# Patient Record
Sex: Female | Born: 1935 | Race: White | Hispanic: No | Smoking: Never smoker
Health system: Southern US, Community
[De-identification: ages and names within clinical notes are randomized; demographics above are authoritative.]

## PROBLEM LIST (undated history)

## (undated) DIAGNOSIS — R5383 Other fatigue: Secondary | ICD-10-CM

## (undated) DIAGNOSIS — N1330 Unspecified hydronephrosis: Secondary | ICD-10-CM

## (undated) DIAGNOSIS — C50919 Malignant neoplasm of unspecified site of unspecified female breast: Secondary | ICD-10-CM

## (undated) DIAGNOSIS — R7303 Prediabetes: Secondary | ICD-10-CM

## (undated) DIAGNOSIS — R600 Localized edema: Secondary | ICD-10-CM

## (undated) DIAGNOSIS — R7401 Elevation of levels of liver transaminase levels: Secondary | ICD-10-CM

## (undated) DIAGNOSIS — I700 Atherosclerosis of aorta: Secondary | ICD-10-CM

## (undated) DIAGNOSIS — H9209 Otalgia, unspecified ear: Secondary | ICD-10-CM

## (undated) DIAGNOSIS — K760 Fatty (change of) liver, not elsewhere classified: Secondary | ICD-10-CM

## (undated) DIAGNOSIS — M545 Low back pain, unspecified: Secondary | ICD-10-CM

## (undated) DIAGNOSIS — R740 Nonspecific elevation of levels of transaminase and lactic acid dehydrogenase [LDH]: Secondary | ICD-10-CM

## (undated) DIAGNOSIS — H9319 Tinnitus, unspecified ear: Secondary | ICD-10-CM

## (undated) DIAGNOSIS — R7402 Elevation of levels of lactic acid dehydrogenase (LDH): Secondary | ICD-10-CM

## (undated) DIAGNOSIS — M1990 Unspecified osteoarthritis, unspecified site: Secondary | ICD-10-CM

## (undated) DIAGNOSIS — R5381 Other malaise: Secondary | ICD-10-CM

## (undated) DIAGNOSIS — C189 Malignant neoplasm of colon, unspecified: Secondary | ICD-10-CM

## (undated) DIAGNOSIS — K7689 Other specified diseases of liver: Secondary | ICD-10-CM

## (undated) DIAGNOSIS — R609 Edema, unspecified: Secondary | ICD-10-CM

## (undated) DIAGNOSIS — E785 Hyperlipidemia, unspecified: Secondary | ICD-10-CM

## (undated) DIAGNOSIS — K2100 Gastro-esophageal reflux disease with esophagitis, without bleeding: Secondary | ICD-10-CM

## (undated) DIAGNOSIS — R1310 Dysphagia, unspecified: Secondary | ICD-10-CM

## (undated) DIAGNOSIS — K7460 Unspecified cirrhosis of liver: Secondary | ICD-10-CM

## (undated) DIAGNOSIS — K769 Liver disease, unspecified: Secondary | ICD-10-CM

## (undated) DIAGNOSIS — F411 Generalized anxiety disorder: Secondary | ICD-10-CM

## (undated) DIAGNOSIS — E559 Vitamin D deficiency, unspecified: Secondary | ICD-10-CM

## (undated) DIAGNOSIS — G4700 Insomnia, unspecified: Secondary | ICD-10-CM

## (undated) DIAGNOSIS — M419 Scoliosis, unspecified: Secondary | ICD-10-CM

## (undated) DIAGNOSIS — J90 Pleural effusion, not elsewhere classified: Secondary | ICD-10-CM

## (undated) DIAGNOSIS — M25569 Pain in unspecified knee: Secondary | ICD-10-CM

## (undated) DIAGNOSIS — M25519 Pain in unspecified shoulder: Secondary | ICD-10-CM

## (undated) DIAGNOSIS — R51 Headache: Secondary | ICD-10-CM

## (undated) DIAGNOSIS — R002 Palpitations: Secondary | ICD-10-CM

## (undated) DIAGNOSIS — F3289 Other specified depressive episodes: Secondary | ICD-10-CM

## (undated) DIAGNOSIS — R188 Other ascites: Secondary | ICD-10-CM

## (undated) DIAGNOSIS — K648 Other hemorrhoids: Secondary | ICD-10-CM

## (undated) DIAGNOSIS — F329 Major depressive disorder, single episode, unspecified: Secondary | ICD-10-CM

## (undated) DIAGNOSIS — H9190 Unspecified hearing loss, unspecified ear: Secondary | ICD-10-CM

## (undated) DIAGNOSIS — G5600 Carpal tunnel syndrome, unspecified upper limb: Secondary | ICD-10-CM

## (undated) DIAGNOSIS — I10 Essential (primary) hypertension: Secondary | ICD-10-CM

## (undated) DIAGNOSIS — R748 Abnormal levels of other serum enzymes: Secondary | ICD-10-CM

## (undated) DIAGNOSIS — I4891 Unspecified atrial fibrillation: Secondary | ICD-10-CM

## (undated) DIAGNOSIS — IMO0002 Reserved for concepts with insufficient information to code with codable children: Secondary | ICD-10-CM

## (undated) DIAGNOSIS — M25559 Pain in unspecified hip: Secondary | ICD-10-CM

## (undated) DIAGNOSIS — R1900 Intra-abdominal and pelvic swelling, mass and lump, unspecified site: Secondary | ICD-10-CM

## (undated) HISTORY — DX: Elevation of levels of liver transaminase levels: R74.01

## (undated) HISTORY — DX: Generalized anxiety disorder: F41.1

## (undated) HISTORY — DX: Other fatigue: R53.83

## (undated) HISTORY — DX: Reserved for concepts with insufficient information to code with codable children: IMO0002

## (undated) HISTORY — DX: Other malaise: R53.81

## (undated) HISTORY — PX: APPENDECTOMY: SHX54

## (undated) HISTORY — DX: Other hemorrhoids: K64.8

## (undated) HISTORY — DX: Nonspecific elevation of levels of transaminase and lactic acid dehydrogenase (ldh): R74.0

## (undated) HISTORY — DX: Headache: R51

## (undated) HISTORY — DX: Insomnia, unspecified: G47.00

## (undated) HISTORY — DX: Other specified diseases of liver: K76.89

## (undated) HISTORY — DX: Elevation of levels of lactic acid dehydrogenase (LDH): R74.02

## (undated) HISTORY — DX: Gastro-esophageal reflux disease with esophagitis: K21.0

## (undated) HISTORY — DX: Low back pain, unspecified: M54.50

## (undated) HISTORY — DX: Palpitations: R00.2

## (undated) HISTORY — DX: Dysphagia, unspecified: R13.10

## (undated) HISTORY — DX: Gastro-esophageal reflux disease with esophagitis, without bleeding: K21.00

## (undated) HISTORY — DX: Otalgia, unspecified ear: H92.09

## (undated) HISTORY — DX: Pain in unspecified shoulder: M25.519

## (undated) HISTORY — DX: Vitamin D deficiency, unspecified: E55.9

## (undated) HISTORY — DX: Malignant neoplasm of unspecified site of unspecified female breast: C50.919

## (undated) HISTORY — DX: Low back pain: M54.5

## (undated) HISTORY — DX: Major depressive disorder, single episode, unspecified: F32.9

## (undated) HISTORY — DX: Carpal tunnel syndrome, unspecified upper limb: G56.00

## (undated) HISTORY — DX: Hyperlipidemia, unspecified: E78.5

## (undated) HISTORY — DX: Abnormal levels of other serum enzymes: R74.8

## (undated) HISTORY — DX: Edema, unspecified: R60.9

## (undated) HISTORY — DX: Essential (primary) hypertension: I10

## (undated) HISTORY — DX: Pain in unspecified hip: M25.559

## (undated) HISTORY — DX: Pain in unspecified knee: M25.569

## (undated) HISTORY — DX: Other specified depressive episodes: F32.89

## (undated) HISTORY — DX: Unspecified osteoarthritis, unspecified site: M19.90

---

## 1978-07-04 HISTORY — PX: DILATION AND CURETTAGE OF UTERUS: SHX78

## 1979-07-05 HISTORY — PX: ABDOMINAL HYSTERECTOMY: SHX81

## 1980-07-04 HISTORY — PX: BREAST CYST EXCISION: SHX579

## 1985-07-04 HISTORY — PX: CHOLECYSTECTOMY: SHX55

## 1992-07-04 DIAGNOSIS — C50919 Malignant neoplasm of unspecified site of unspecified female breast: Secondary | ICD-10-CM | POA: Insufficient documentation

## 1992-07-04 HISTORY — PX: BREAST LUMPECTOMY: SHX2

## 2000-07-04 HISTORY — PX: LUMBAR SPINE SURGERY: SHX701

## 2000-10-31 ENCOUNTER — Encounter: Payer: Self-pay | Admitting: Internal Medicine

## 2000-10-31 ENCOUNTER — Encounter: Admission: RE | Admit: 2000-10-31 | Discharge: 2000-10-31 | Payer: Self-pay | Admitting: Internal Medicine

## 2001-01-08 ENCOUNTER — Encounter: Payer: Self-pay | Admitting: *Deleted

## 2001-01-08 ENCOUNTER — Ambulatory Visit (HOSPITAL_COMMUNITY): Admission: RE | Admit: 2001-01-08 | Discharge: 2001-01-08 | Payer: Self-pay | Admitting: *Deleted

## 2001-01-31 ENCOUNTER — Ambulatory Visit (HOSPITAL_COMMUNITY): Admission: RE | Admit: 2001-01-31 | Discharge: 2001-02-01 | Payer: Self-pay | Admitting: *Deleted

## 2001-01-31 ENCOUNTER — Encounter: Payer: Self-pay | Admitting: *Deleted

## 2001-07-04 HISTORY — PX: TOTAL HIP ARTHROPLASTY: SHX124

## 2001-07-04 HISTORY — PX: LUMBAR DISC SURGERY: SHX700

## 2001-07-31 ENCOUNTER — Encounter: Payer: Self-pay | Admitting: Internal Medicine

## 2001-07-31 ENCOUNTER — Ambulatory Visit (HOSPITAL_COMMUNITY): Admission: RE | Admit: 2001-07-31 | Discharge: 2001-07-31 | Payer: Self-pay | Admitting: Internal Medicine

## 2001-08-13 ENCOUNTER — Encounter: Payer: Self-pay | Admitting: Internal Medicine

## 2001-08-13 ENCOUNTER — Ambulatory Visit (HOSPITAL_COMMUNITY): Admission: RE | Admit: 2001-08-13 | Discharge: 2001-08-13 | Payer: Self-pay | Admitting: Internal Medicine

## 2001-08-27 ENCOUNTER — Encounter: Payer: Self-pay | Admitting: *Deleted

## 2001-08-27 ENCOUNTER — Ambulatory Visit (HOSPITAL_COMMUNITY): Admission: RE | Admit: 2001-08-27 | Discharge: 2001-08-28 | Payer: Self-pay | Admitting: *Deleted

## 2001-10-08 ENCOUNTER — Inpatient Hospital Stay (HOSPITAL_COMMUNITY): Admission: RE | Admit: 2001-10-08 | Discharge: 2001-10-12 | Payer: Self-pay | Admitting: Orthopedic Surgery

## 2002-07-01 ENCOUNTER — Encounter: Payer: Self-pay | Admitting: Orthopedic Surgery

## 2002-07-01 ENCOUNTER — Inpatient Hospital Stay (HOSPITAL_COMMUNITY): Admission: RE | Admit: 2002-07-01 | Discharge: 2002-07-05 | Payer: Self-pay | Admitting: Orthopedic Surgery

## 2002-07-02 ENCOUNTER — Encounter: Payer: Self-pay | Admitting: Orthopedic Surgery

## 2011-11-17 ENCOUNTER — Other Ambulatory Visit: Payer: Self-pay | Admitting: Internal Medicine

## 2011-11-17 ENCOUNTER — Ambulatory Visit
Admission: RE | Admit: 2011-11-17 | Discharge: 2011-11-17 | Disposition: A | Discharge status: Home / Self Care | Payer: Medicare Other | Source: Ambulatory Visit | Attending: Internal Medicine | Admitting: Internal Medicine

## 2011-11-17 DIAGNOSIS — W19XXXA Unspecified fall, initial encounter: Secondary | ICD-10-CM

## 2012-09-27 ENCOUNTER — Other Ambulatory Visit: Payer: Self-pay | Admitting: Geriatric Medicine

## 2012-09-27 MED ORDER — HYDROCODONE-ACETAMINOPHEN 10-325 MG PO TABS: ORAL_TABLET | ORAL | Status: DC

## 2012-11-09 ENCOUNTER — Other Ambulatory Visit: Payer: Self-pay | Admitting: *Deleted

## 2012-11-09 MED ORDER — HYDROCODONE-ACETAMINOPHEN 10-325 MG PO TABS: ORAL_TABLET | ORAL | Status: DC

## 2012-11-19 ENCOUNTER — Other Ambulatory Visit: Payer: Medicare Other

## 2012-11-19 ENCOUNTER — Other Ambulatory Visit: Payer: Self-pay | Admitting: *Deleted

## 2012-11-19 DIAGNOSIS — I10 Essential (primary) hypertension: Secondary | ICD-10-CM

## 2012-11-19 DIAGNOSIS — E785 Hyperlipidemia, unspecified: Secondary | ICD-10-CM

## 2012-11-19 DIAGNOSIS — IMO0001 Reserved for inherently not codable concepts without codable children: Secondary | ICD-10-CM

## 2012-11-20 ENCOUNTER — Encounter: Payer: Self-pay | Admitting: Geriatric Medicine

## 2012-11-20 LAB — COMPREHENSIVE METABOLIC PANEL
Albumin: 4.1 g/dL (ref 3.5–4.8)
Alkaline Phosphatase: 78 IU/L (ref 39–117)
BUN/Creatinine Ratio: 12 (ref 11–26)
BUN: 11 mg/dL (ref 8–27)
CO2: 23 mmol/L (ref 19–28)
Creatinine, Ser: 0.95 mg/dL (ref 0.57–1.00)
GFR calc Af Amer: 67 mL/min/{1.73_m2} (ref >59)
GFR calc non Af Amer: 58 mL/min/{1.73_m2} — ABNORMAL LOW (ref >59)
Globulin, Total: 2.2 g/dL (ref 1.5–4.5)

## 2012-11-20 LAB — LIPID PANEL
Chol/HDL Ratio: 4.5 ratio units — ABNORMAL HIGH (ref 0.0–4.4)
LDL Calculated: 123 mg/dL — ABNORMAL HIGH (ref 0–99)
Triglycerides: 202 mg/dL — ABNORMAL HIGH (ref 0–149)

## 2012-11-21 ENCOUNTER — Encounter: Payer: Self-pay | Admitting: Internal Medicine

## 2012-11-21 ENCOUNTER — Ambulatory Visit (INDEPENDENT_AMBULATORY_CARE_PROVIDER_SITE_OTHER): Payer: Medicare Other | Admitting: Internal Medicine

## 2012-11-21 VITALS — BP 160/92 | HR 59 | Temp 98.0°F | Resp 14 | Wt 185.8 lb

## 2012-11-21 DIAGNOSIS — E119 Type 2 diabetes mellitus without complications: Secondary | ICD-10-CM

## 2012-11-21 DIAGNOSIS — I10 Essential (primary) hypertension: Secondary | ICD-10-CM

## 2012-11-21 DIAGNOSIS — K7581 Nonalcoholic steatohepatitis (NASH): Secondary | ICD-10-CM | POA: Insufficient documentation

## 2012-11-21 DIAGNOSIS — R748 Abnormal levels of other serum enzymes: Secondary | ICD-10-CM

## 2012-11-21 DIAGNOSIS — M25569 Pain in unspecified knee: Secondary | ICD-10-CM

## 2012-11-21 DIAGNOSIS — R300 Dysuria: Secondary | ICD-10-CM

## 2012-11-21 DIAGNOSIS — K7689 Other specified diseases of liver: Secondary | ICD-10-CM

## 2012-11-21 DIAGNOSIS — M25561 Pain in right knee: Secondary | ICD-10-CM

## 2012-11-21 DIAGNOSIS — M25519 Pain in unspecified shoulder: Secondary | ICD-10-CM

## 2012-11-21 DIAGNOSIS — G4700 Insomnia, unspecified: Secondary | ICD-10-CM

## 2012-11-21 DIAGNOSIS — R350 Frequency of micturition: Secondary | ICD-10-CM

## 2012-11-21 DIAGNOSIS — E785 Hyperlipidemia, unspecified: Secondary | ICD-10-CM

## 2012-11-21 DIAGNOSIS — E559 Vitamin D deficiency, unspecified: Secondary | ICD-10-CM | POA: Insufficient documentation

## 2012-11-21 NOTE — Patient Instructions (Signed)
Continue current medications. 

## 2012-11-21 NOTE — Progress Notes (Signed)
Subjective:    Patient ID: Tamara Yang, female    DOB: 04/10/1933, 79 y.o.   MRN: [id number]  HPI  Pain in joint, shoulder region, unspecified laterality: Shoulders sore all the time. Has to push up from chairs. Patient has a strong family history for degenerative arthritis problems afflicting the shoulders as well as multiple other joints.  Pain in joint, lower leg, right: Chronic discomfort in the right leg particularly at the knee. No significant change.  Insomnia, unspecified: Improved  Type II or unspecified type diabetes mellitus without mention of complication, not stated as uncontrolled: Stable  Other and unspecified hyperlipidemia: Stable  Unspecified essential hypertension: Stable  Other chronic nonalcoholic liver disease: Fatty liver, unchanged from the past  Other nonspecific abnormal serum enzyme levels: Improved  Urinary frequency: Present with other symptoms suggesting possible UTI  Dysuria :Nocturia, mild dysuria, increased frequency.  Unspecified vitamin D deficiency: Patient needs followup of her lab work    Review of Systems  Constitutional: Negative for fever, activity change, appetite change, fatigue and unexpected weight change.  HENT: Positive for hearing loss, neck pain, neck stiffness and tinnitus. Negative for ear pain and sinus pressure.   Eyes:       Corrective lenses. Normal diabetic eye exam by Dr. Dione Yang 07/13/2010.  Respiratory: Negative.   Cardiovascular: Negative.   Gastrointestinal: Negative.   Endocrine:       History of diabetes  Musculoskeletal:       Diffuse joint pains. Shoulders are worse. Generalized arthritis is present. Gait stable.  Skin: Negative.   Neurological: Positive for headaches. Negative for dizziness, tremors, seizures, facial asymmetry, speech difficulty, weakness and numbness.  Hematological: Negative.   Psychiatric/Behavioral: Negative.        Objective: BP 160/92  Pulse 59  Temp(Src) 98 F (36.7 C)  (Oral)  Resp 14  Wt 185 lb 12.8 oz (84.278 kg)    Physical Exam  Constitutional: She is oriented to person, place, and time. She appears well-developed and well-nourished. No distress.  Mildly overweight.  HENT:  Bilateral partial deafness.  Eyes: Conjunctivae and EOM are normal. Pupils are equal, round, and reactive to light. Left eye exhibits no discharge. No scleral icterus.  Neck: Normal range of motion. Neck supple. No JVD present. No tracheal deviation present. No thyromegaly present.  Cardiovascular: Normal rate, regular rhythm and intact distal pulses.  Exam reveals friction rub. Exam reveals no gallop.   No murmur heard. Pulmonary/Chest: Effort normal and breath sounds normal. No respiratory distress. She has no wheezes. She has no rales.  Abdominal: Soft. Bowel sounds are normal. She exhibits no distension and no mass. There is no tenderness.  Musculoskeletal: She exhibits tenderness. She exhibits no edema.  Reduced ability to raise her arms  Lymphadenopathy:    She has no cervical adenopathy.  Neurological: She is alert and oriented to person, place, and time. No cranial nerve deficit. Coordination normal.  Skin: Skin is warm and dry. No rash noted. No erythema. No pallor.  Psychiatric: She has a normal mood and affect. Her behavior is normal. Judgment and thought content normal.     Appointment on 11/19/2012  Component Date Value Range Status  . Hemoglobin A1C 11/19/2012 6.2* 4.8 - 5.6 % Final   Comment:          Increased risk for diabetes: 5.7 - 6.4  Diabetes: >6.4                                   Glycemic control for adults with diabetes: <7.0  . Estimated average glucose 11/19/2012 131   Final  . Glucose 11/19/2012 105* 65 - 99 mg/dL Final  . BUN 40/98/1191 11  8 - 27 mg/dL Final  . Creatinine, Ser 11/19/2012 0.95  0.57 - 1.00 mg/dL Final  . GFR calc non Af Amer 11/19/2012 58* >59 mL/min/1.73 Final  . GFR calc Af Amer 11/19/2012 67   >59 mL/min/1.73 Final  . BUN/Creatinine Ratio 11/19/2012 12  11 - 26 Final  . Sodium 11/19/2012 143  134 - 144 mmol/L Final  . Potassium 11/19/2012 4.5  3.5 - 5.2 mmol/L Final  . Chloride 11/19/2012 105  97 - 108 mmol/L Final  . CO2 11/19/2012 23  19 - 28 mmol/L Final  . Calcium 11/19/2012 9.7  8.6 - 10.2 mg/dL Final  . Total Protein 11/19/2012 6.3  6.0 - 8.5 g/dL Final  . Albumin 47/82/9562 4.1  3.5 - 4.8 g/dL Final  . Globulin, Total 11/19/2012 2.2  1.5 - 4.5 g/dL Final  . Albumin/Globulin Ratio 11/19/2012 1.9  1.1 - 2.5 Final  . Total Bilirubin 11/19/2012 0.3  0.0 - 1.2 mg/dL Final  . Alkaline Phosphatase 11/19/2012 78  39 - 117 IU/L Final  . AST 11/19/2012 38  0 - 40 IU/L Final  . ALT 11/19/2012 37* 0 - 32 IU/L Final  . Cholesterol, Total 11/19/2012 209* 100 - 199 mg/dL Final  . Triglycerides 11/19/2012 202* 0 - 149 mg/dL Final  . HDL 13/02/6577 46  >39 mg/dL Final   Comment: According to ATP-III Guidelines, HDL-C >59 mg/dL is considered a                          negative risk factor for CHD.  Marland Kitchen VLDL Cholesterol Cal 11/19/2012 40  5 - 40 mg/dL Final  . LDL Calculated 11/19/2012 469* 0 - 99 mg/dL Final  . Chol/HDL Ratio 11/19/2012 4.5* 0.0 - 4.4 ratio units Final        Assessment & Plan:  1. Pain in joint, shoulder region, unspecified laterality Continue on current pain medications  2. Pain in joint, lower leg, right Continue current pain medications  3. Insomnia, unspecified No change in medications  4. Type II or unspecified type diabetes mellitus without mention of complication, not stated as uncontrolled No change in medications - CMP; Future  5. Other and unspecified hyperlipidemia No change in medication - Lipid Panel; Future  6. Unspecified essential hypertension Controlled  7. Other chronic nonalcoholic liver disease Hepatic steatosis possibly related to diabetes. No significant change  8. Other nonspecific abnormal serum enzyme levels Mild elevation  in the past of hepatic enzymes  9. Urinary frequency Rule out infection - Culture, Urine - Urinalysis with Reflex Microscopic  10. Dysuria Rule out infection - Culture, Urine - Urinalysis with Reflex Microscopic  11. Unspecified vitamin D deficiency Followup lab - Vitamin D, 1,25-dihydroxy; Future

## 2012-11-22 LAB — URINALYSIS, ROUTINE W REFLEX MICROSCOPIC
Leukocytes, UA: NEGATIVE
Nitrite, UA: NEGATIVE
RBC, UA: NEGATIVE
Specific Gravity, UA: 1.015 (ref 1.005–1.030)
pH, UA: 6 (ref 5.0–7.5)

## 2012-12-21 ENCOUNTER — Other Ambulatory Visit: Payer: Self-pay | Admitting: Geriatric Medicine

## 2012-12-21 MED ORDER — HYDROCODONE-ACETAMINOPHEN 10-325 MG PO TABS: ORAL_TABLET | ORAL | Status: DC

## 2013-01-02 ENCOUNTER — Telehealth: Payer: Self-pay | Admitting: *Deleted

## 2013-01-02 NOTE — Telephone Encounter (Signed)
Patient called and stated that her back is in a lot of pain. I offered her an appointment and she declined. Stated that she would deal with it probably use a heating pad

## 2013-02-01 ENCOUNTER — Other Ambulatory Visit: Payer: Self-pay | Admitting: Geriatric Medicine

## 2013-02-01 MED ORDER — HYDROCODONE-ACETAMINOPHEN 10-325 MG PO TABS: ORAL_TABLET | ORAL | Status: DC

## 2013-02-21 ENCOUNTER — Other Ambulatory Visit: Payer: Self-pay | Admitting: Geriatric Medicine

## 2013-02-21 MED ORDER — HYDROCODONE-ACETAMINOPHEN 10-325 MG PO TABS: ORAL_TABLET | ORAL | Status: DC

## 2013-03-27 ENCOUNTER — Ambulatory Visit (INDEPENDENT_AMBULATORY_CARE_PROVIDER_SITE_OTHER): Payer: Medicare Other | Admitting: Internal Medicine

## 2013-03-27 ENCOUNTER — Encounter: Payer: Self-pay | Admitting: Internal Medicine

## 2013-03-27 VITALS — BP 156/98 | HR 63 | Temp 98.1°F | Resp 14 | Ht 65.0 in | Wt 186.2 lb

## 2013-03-27 DIAGNOSIS — E785 Hyperlipidemia, unspecified: Secondary | ICD-10-CM

## 2013-03-27 DIAGNOSIS — I10 Essential (primary) hypertension: Secondary | ICD-10-CM

## 2013-03-27 DIAGNOSIS — G4700 Insomnia, unspecified: Secondary | ICD-10-CM

## 2013-03-27 DIAGNOSIS — E559 Vitamin D deficiency, unspecified: Secondary | ICD-10-CM

## 2013-03-27 DIAGNOSIS — M25561 Pain in right knee: Secondary | ICD-10-CM

## 2013-03-27 DIAGNOSIS — E119 Type 2 diabetes mellitus without complications: Secondary | ICD-10-CM

## 2013-03-27 DIAGNOSIS — R748 Abnormal levels of other serum enzymes: Secondary | ICD-10-CM

## 2013-03-27 DIAGNOSIS — K7689 Other specified diseases of liver: Secondary | ICD-10-CM

## 2013-03-27 DIAGNOSIS — M25569 Pain in unspecified knee: Secondary | ICD-10-CM

## 2013-03-27 MED ORDER — CYCLOBENZAPRINE HCL 10 MG PO TABS: ORAL_TABLET | ORAL | Status: DC

## 2013-03-27 MED ORDER — ENALAPRIL MALEATE 20 MG PO TABS: 20.0000 mg | ORAL_TABLET | Freq: Two times a day (BID) | ORAL | Status: DC

## 2013-03-27 MED ORDER — HYDROCODONE-ACETAMINOPHEN 10-325 MG PO TABS: ORAL_TABLET | ORAL | Status: DC

## 2013-03-27 NOTE — Progress Notes (Addendum)
Subjective:    Patient ID: Tamara Yang, female    DOB: [birth year], 79 y.o.   MRN: [id number]  HPI Type II or unspecified type diabetes mellitus without mention of complication, not stated as uncontrolled  Unspecified essential hypertension: rushing around today. Home Bp reported normal  Unspecified vitamin D deficiency: not on supplements  Other and unspecified hyperlipidemia: needs lab  Other nonspecific abnormal serum enzyme levels: needs lab  Other chronic nonalcoholic liver disease: needs lab  Insomnia, unspecified: improved  Pain in joint, lower leg, right: to see Dr. Turner Daniels in Nov 2014.    Current Outpatient Prescriptions on File Prior to Visit  Medication Sig Dispense Refill  . amLODipine (NORVASC) 2.5 MG tablet 2.5 mg. Take one tablet by mouth daily for blood pressure.      Marland Kitchen aspirin 81 MG tablet Take 81 mg by mouth daily.      . enalapril (VASOTEC) 20 MG tablet Take 20 mg by mouth 2 (two) times daily. To help control blood pressure.      . hydrochlorothiazide (HYDRODIURIL) 25 MG tablet 25 mg. Take one tablet daily to help swelling.      Marland Kitchen HYDROcodone-acetaminophen (NORCO) 10-325 MG per tablet Take one tablet by mouth up to four times a day as needed for pain.  120 tablet  0  . metoprolol succinate (TOPROL-XL) 50 MG 24 hr tablet 50 mg. Take one tablet twice a day for blood pressure.      Marland Kitchen omeprazole (PRILOSEC) 20 MG capsule 20 mg. Take one tablet at bedtime to reduce stomach acid and to help heartburn.       No current facility-administered medications on file prior to visit.    Review of Systems  Constitutional: Negative for fever, activity change, appetite change, fatigue and unexpected weight change.  HENT: Positive for hearing loss, neck pain, neck stiffness and tinnitus. Negative for ear pain and sinus pressure.   Eyes:       Corrective lenses. Normal diabetic eye exam by Dr. Dione Booze 07/13/2010.  Respiratory: Negative.   Cardiovascular: Negative.    Gastrointestinal: Negative.   Endocrine:       History of diabetes  Musculoskeletal:       Diffuse joint pains. Shoulders are stable. Generalized arthritis is present. Has right knee pains. Gait stable.  Skin: Negative.   Neurological: Positive for headaches. Negative for dizziness, tremors, seizures, facial asymmetry, speech difficulty, weakness and numbness.  Hematological: Negative.   Psychiatric/Behavioral: Negative.        Objective:BP 156/98  Pulse 63  Temp(Src) 98.1 F (36.7 C) (Oral)  Resp 14  Ht 5\' 5"  (1.651 m)  Wt 186 lb 3.2 oz (84.46 kg)  BMI 30.99 kg/m2    Physical Exam  Constitutional: She is oriented to person, place, and time. She appears well-developed and well-nourished. No distress.  Mildly overweight.  HENT:  Bilateral partial deafness.  Eyes: Conjunctivae and EOM are normal. Pupils are equal, round, and reactive to light. Left eye exhibits no discharge. No scleral icterus.  Neck: Normal range of motion. Neck supple. No JVD present. No tracheal deviation present. No thyromegaly present.  Cardiovascular: Normal rate, regular rhythm and intact distal pulses.  Exam reveals no gallop and no friction rub.   No murmur heard. Pulmonary/Chest: Effort normal and breath sounds normal. No respiratory distress. She has no wheezes. She has no rales.  Abdominal: Soft. Bowel sounds are normal. She exhibits no distension and no mass. There is no tenderness.  Musculoskeletal: She exhibits tenderness. She  exhibits no edema.  Reduced ability to raise her arms. Right knee pains.  Lymphadenopathy:    She has no cervical adenopathy.  Neurological: She is alert and oriented to person, place, and time. No cranial nerve deficit. Coordination normal.  Skin: Skin is warm and dry. No rash noted. No erythema. No pallor.  Psychiatric: She has a normal mood and affect. Her behavior is normal. Judgment and thought content normal.           Assessment & Plan:  Type II or  unspecified type diabetes mellitus without mention of complication, not stated as uncontrolled - Plan: Hemoglobin A1c, Microalbumin/Creatinine Ratio, Urine  Unspecified essential hypertension - Plan: enalapril (VASOTEC) 20 MG tablet, CMP  Unspecified vitamin D deficiency: repeat in future  Other and unspecified hyperlipidemia: repeat in future  Other nonspecific abnormal serum enzyme levels: repeat in future  Other chronic nonalcoholic liver disease: repeat in future  Insomnia, unspecified: improved  Pain in joint, lower leg, right:   - Plan: See Dr. Turner Daniels as planned   cyclobenzaprine (FLEXERIL) 10 MG tablet, HYDROcodone-acetaminophen (NORCO) 10-325 MG per tablet

## 2013-03-27 NOTE — Patient Instructions (Signed)
Continue current medications. 

## 2013-05-09 ENCOUNTER — Other Ambulatory Visit: Payer: Self-pay | Admitting: *Deleted

## 2013-05-09 DIAGNOSIS — M25561 Pain in right knee: Secondary | ICD-10-CM

## 2013-05-09 MED ORDER — HYDROCODONE-ACETAMINOPHEN 10-325 MG PO TABS: ORAL_TABLET | ORAL | Status: DC

## 2013-06-13 ENCOUNTER — Other Ambulatory Visit: Payer: Self-pay | Admitting: *Deleted

## 2013-06-13 DIAGNOSIS — M25561 Pain in right knee: Secondary | ICD-10-CM

## 2013-06-13 MED ORDER — HYDROCODONE-ACETAMINOPHEN 10-325 MG PO TABS: ORAL_TABLET | ORAL | Status: DC

## 2013-06-25 ENCOUNTER — Encounter: Payer: Self-pay | Admitting: Internal Medicine

## 2013-06-25 ENCOUNTER — Ambulatory Visit (INDEPENDENT_AMBULATORY_CARE_PROVIDER_SITE_OTHER): Payer: Medicare Other | Admitting: Internal Medicine

## 2013-06-25 VITALS — BP 140/82 | HR 69 | Temp 98.0°F | Resp 12 | Wt 187.2 lb

## 2013-06-25 DIAGNOSIS — J209 Acute bronchitis, unspecified: Secondary | ICD-10-CM

## 2013-06-25 MED ORDER — HYDROCOD POLST-CHLORPHEN POLST 10-8 MG/5ML PO LQCR: ORAL | Status: DC

## 2013-06-25 MED ORDER — AZITHROMYCIN 250 MG PO TABS: ORAL_TABLET | ORAL | Status: DC

## 2013-06-25 NOTE — Progress Notes (Signed)
Patient ID: Tamara Yang, female   DOB: Feb 21, 1933, 79 y.o.   MRN: [id number]    Location:    [facility]  Place of Service:  office    Allergies  Allergen Reactions  . Daypro [Oxaprozin]   . Erythromycin   . Talwin [Pentazocine]     Chief Complaint  Patient presents with  . URI    Cough(productive)and congestion x 3 days     HPI  Cough since12/19/14. Producing yellow-Gonzalo [place] sputum. Thick. No fever or chills. Pain and stuffiness in the left ear.  Medications: Patient's Medications  New Prescriptions   No medications on file  Previous Medications   AMLODIPINE (NORVASC) 2.5 MG TABLET    2.5 mg. Take one tablet by mouth daily for blood pressure.   ASPIRIN 81 MG TABLET    Take 81 mg by mouth daily.   CHOLECALCIFEROL (VITAMIN D) 1000 UNITS TABLET    Take two tablets once daily   CYCLOBENZAPRINE (FLEXERIL) 10 MG TABLET    One up to 3 times daily as needed to help muscle spasm and pain   ENALAPRIL (VASOTEC) 20 MG TABLET    Take 1 tablet (20 mg total) by mouth 2 (two) times daily. To help control blood pressure.   HYDROCHLOROTHIAZIDE (HYDRODIURIL) 25 MG TABLET    25 mg. Take one tablet daily to help swelling.   HYDROCODONE-ACETAMINOPHEN (NORCO) 10-325 MG PER TABLET    Take one tablet by mouth up to four times a day as needed for pain.   METOPROLOL (LOPRESSOR) 50 MG TABLET       METOPROLOL SUCCINATE (TOPROL-XL) 50 MG 24 HR TABLET    50 mg. Take one tablet twice a day for blood pressure.   OMEPRAZOLE (PRILOSEC) 20 MG CAPSULE    20 mg. Take one tablet at bedtime to reduce stomach acid and to help heartburn.  Modified Medications   No medications on file  Discontinued Medications   No medications on file     Review of Systems  Constitutional: Negative for fever, activity change, appetite change, fatigue and unexpected weight change.  HENT: Positive for hearing loss, sore throat and tinnitus.   Eyes:       Corrective lenses. Normal diabetic eye exam by Dr. Dione Booze 07/13/2010.    Respiratory: Positive for cough.        Ronchial  rattle  Cardiovascular: Negative.   Gastrointestinal: Negative.   Endocrine:       History of diabetes  Musculoskeletal: Positive for neck pain and neck stiffness.       Diffuse joint pains. Shoulders are stable. Generalized arthritis is present. Has right knee pains. Gait stable.  Skin: Negative.   Neurological: Positive for headaches. Negative for dizziness, tremors, seizures, facial asymmetry, speech difficulty, weakness and numbness.  Hematological: Negative.   Psychiatric/Behavioral: Negative.     Filed Vitals:   06/25/13 1443  BP: 140/82  Pulse: 69  Temp: 98 F (36.7 C)  TempSrc: Oral  Resp: 12  Weight: 187 lb 3.2 oz (84.913 kg)  SpO2: 95%   Physical Exam  Constitutional: She is oriented to person, place, and time. She appears well-developed and well-nourished. No distress.  Mildly overweight.  HENT:  Bilateral partial deafness.  Eyes: Conjunctivae and EOM are normal. Pupils are equal, round, and reactive to light. Left eye exhibits no discharge. No scleral icterus.  Neck: Normal range of motion. Neck supple. No JVD present. No tracheal deviation present. No thyromegaly present.  Cardiovascular: Normal rate, regular rhythm and intact distal  pulses.  Exam reveals no gallop and no friction rub.   No murmur heard. Pulmonary/Chest: Effort normal. No respiratory distress. She has no wheezes. She has rales (bronchial rattle).  Abdominal: Soft. Bowel sounds are normal. She exhibits no distension and no mass. There is no tenderness.  Musculoskeletal: She exhibits tenderness. She exhibits no edema.  Reduced ability to raise her arms. Right knee pains.  Lymphadenopathy:    She has no cervical adenopathy.  Neurological: She is alert and oriented to person, place, and time. No cranial nerve deficit. Coordination normal.  Skin: Skin is warm and dry. No rash noted. No erythema. No pallor.  Psychiatric: She has a normal mood and  affect. Her behavior is normal. Judgment and thought content normal.     Labs reviewed: No visits with results within 3 Month(s) from this visit. Latest known visit with results is:  Office Visit on 11/21/2012  Component Date Value Range Status  . Urine Culture, Routine 11/21/2012 Final report   Final  . Result 1 11/21/2012 No growth   Final  . Specific Gravity, UA 11/21/2012 1.015  1.005 - 1.030 Final  . pH, UA 11/21/2012 6.0  5.0 - 7.5 Final  . Color, UA 11/21/2012 Yellow  Yellow Final  . Appearance Ur 11/21/2012 Clear  Clear Final  . Leukocytes, UA 11/21/2012 Negative  Negative Final  . Protein, UA 11/21/2012 Negative  Negative/Trace Final  . Glucose, UA 11/21/2012 Negative  Negative Final  . Ketones, UA 11/21/2012 Negative  Negative Final  . RBC, UA 11/21/2012 Negative  Negative Final  . Bilirubin, UA 11/21/2012 Negative  Negative Final  . Urobilinogen, Ur 11/21/2012 0.2  0.0 - 1.9 mg/dL Final  . Nitrite, UA 16/04/9603 Negative  Negative Final  . Microscopic Examination 11/21/2012 Comment   Final   Microscopic not indicated and not performed.      Assessment/Plan  1. Acute bronchitis - azithromycin (ZITHROMAX) 250 MG tablet; 2 tablets initially, followed by one daily for infection  Dispense: 6 tablet; Refill: 0 - chlorpheniramine-HYDROcodone (TUSSIONEX PENNKINETIC ER) 10-8 MG/5ML LQCR; One tsp every 12 hours if needed for cough  Dispense: 120 mL; Refill: 0

## 2013-06-25 NOTE — Patient Instructions (Signed)
Use medications as listed 

## 2013-07-08 ENCOUNTER — Encounter: Payer: Self-pay | Admitting: *Deleted

## 2013-07-08 ENCOUNTER — Other Ambulatory Visit: Payer: Medicare Other

## 2013-07-08 DIAGNOSIS — I10 Essential (primary) hypertension: Secondary | ICD-10-CM

## 2013-07-08 DIAGNOSIS — E119 Type 2 diabetes mellitus without complications: Secondary | ICD-10-CM

## 2013-07-09 LAB — COMPREHENSIVE METABOLIC PANEL
A/G RATIO: 2 (ref 1.1–2.5)
ALK PHOS: 72 IU/L (ref 39–117)
ALT: 56 IU/L — ABNORMAL HIGH (ref 0–32)
AST: 51 IU/L — AB (ref 0–40)
Albumin: 4.2 g/dL (ref 3.5–4.8)
BILIRUBIN TOTAL: 0.6 mg/dL (ref 0.0–1.2)
BUN / CREAT RATIO: 12 (ref 11–26)
BUN: 12 mg/dL (ref 8–27)
CO2: 24 mmol/L (ref 18–29)
Calcium: 9.7 mg/dL (ref 8.6–10.2)
Chloride: 104 mmol/L (ref 97–108)
Creatinine, Ser: 0.98 mg/dL (ref 0.57–1.00)
GFR, EST AFRICAN AMERICAN: 64 mL/min/{1.73_m2} (ref >59)
GFR, EST NON AFRICAN AMERICAN: 55 mL/min/{1.73_m2} — AB (ref >59)
GLOBULIN, TOTAL: 2.1 g/dL (ref 1.5–4.5)
Glucose: 109 mg/dL — ABNORMAL HIGH (ref 65–99)
Potassium: 4.2 mmol/L (ref 3.5–5.2)
SODIUM: 143 mmol/L (ref 134–144)
Total Protein: 6.3 g/dL (ref 6.0–8.5)

## 2013-07-09 LAB — HEMOGLOBIN A1C
ESTIMATED AVERAGE GLUCOSE: 134 mg/dL
HEMOGLOBIN A1C: 6.3 % — AB (ref 4.8–5.6)

## 2013-07-09 LAB — MICROALBUMIN / CREATININE URINE RATIO
Creatinine, Ur: 211.1 mg/dL (ref 15.0–278.0)
MICROALB/CREAT RATIO: 23.6 mg/g{creat} (ref 0.0–30.0)
Microalbumin, Urine: 49.8 ug/mL — ABNORMAL HIGH (ref 0.0–17.0)

## 2013-07-10 ENCOUNTER — Ambulatory Visit (INDEPENDENT_AMBULATORY_CARE_PROVIDER_SITE_OTHER): Payer: Medicare Other | Admitting: Internal Medicine

## 2013-07-10 ENCOUNTER — Encounter: Payer: Self-pay | Admitting: Internal Medicine

## 2013-07-10 VITALS — BP 158/78 | HR 64 | Temp 97.5°F | Resp 18 | Ht 65.0 in | Wt 188.6 lb

## 2013-07-10 DIAGNOSIS — M25561 Pain in right knee: Secondary | ICD-10-CM

## 2013-07-10 DIAGNOSIS — E119 Type 2 diabetes mellitus without complications: Secondary | ICD-10-CM

## 2013-07-10 DIAGNOSIS — M25569 Pain in unspecified knee: Secondary | ICD-10-CM

## 2013-07-10 DIAGNOSIS — I10 Essential (primary) hypertension: Secondary | ICD-10-CM

## 2013-07-10 DIAGNOSIS — J209 Acute bronchitis, unspecified: Secondary | ICD-10-CM

## 2013-07-10 DIAGNOSIS — E785 Hyperlipidemia, unspecified: Secondary | ICD-10-CM

## 2013-07-10 DIAGNOSIS — R748 Abnormal levels of other serum enzymes: Secondary | ICD-10-CM

## 2013-07-10 DIAGNOSIS — E559 Vitamin D deficiency, unspecified: Secondary | ICD-10-CM

## 2013-07-10 DIAGNOSIS — K7689 Other specified diseases of liver: Secondary | ICD-10-CM

## 2013-07-10 MED ORDER — HYDROCODONE-ACETAMINOPHEN 10-325 MG PO TABS: ORAL_TABLET | ORAL | Status: DC

## 2013-07-10 NOTE — Progress Notes (Signed)
Patient ID: Tamara Yang, female   DOB: 08-30-1932, 80 y.o.   MRN: [id number]    Location:      Place of Service:      Allergies  Allergen Reactions  . Daypro [Oxaprozin]   . Erythromycin   . Talwin [Pentazocine]     Chief Complaint  Patient presents with  . Follow-up    HPI:  Type II or unspecified type diabetes mellitus without mention of complication, not stated as uncontrolled : stable  Acute bronchitis: improved  Other chronic nonalcoholic liver disease: causes elevation of the liver enzymes  Other nonspecific abnormal serum enzyme levels :stable  Unspecified essential hypertension: SBP high  Unspecified vitamin D deficiency: stable  Other and unspecified hyperlipidemia :inchanged  Pain in joint, lower leg, right -benefits from HYDROcodone-acetaminophen (NORCO) 10-325 MG per tablet    Medications: Patient's Medications  New Prescriptions   No medications on file  Previous Medications   AMLODIPINE (NORVASC) 2.5 MG TABLET    2.5 mg. Take one tablet by mouth daily for blood pressure.   ASPIRIN 81 MG TABLET    Take 81 mg by mouth daily.   AZITHROMYCIN (ZITHROMAX) 250 MG TABLET    2 tablets initially, followed by one daily for infection   CHLORPHENIRAMINE-HYDROCODONE (TUSSIONEX PENNKINETIC ER) 10-8 MG/5ML LQCR    One tsp every 12 hours if needed for cough   CHOLECALCIFEROL (VITAMIN D) 1000 UNITS TABLET    Take two tablets once daily   CYCLOBENZAPRINE (FLEXERIL) 10 MG TABLET    One up to 3 times daily as needed to help muscle spasm and pain   ENALAPRIL (VASOTEC) 20 MG TABLET    Take 1 tablet (20 mg total) by mouth 2 (two) times daily. To help control blood pressure.   HYDROCHLOROTHIAZIDE (HYDRODIURIL) 25 MG TABLET    25 mg. Take one tablet daily to help swelling.   HYDROCODONE-ACETAMINOPHEN (NORCO) 10-325 MG PER TABLET    Take one tablet by mouth up to four times a day as needed for pain.   METOPROLOL (LOPRESSOR) 50 MG TABLET    Take 50 mg by mouth 2 (two) times  daily.    METOPROLOL SUCCINATE (TOPROL-XL) 50 MG 24 HR TABLET    50 mg. Take one tablet twice a day for blood pressure.   OMEPRAZOLE (PRILOSEC) 20 MG CAPSULE    20 mg. Take one tablet at bedtime to reduce stomach acid and to help heartburn.  Modified Medications   No medications on file  Discontinued Medications   No medications on file     Review of Systems  Constitutional: Negative for fever, activity change, appetite change, fatigue and unexpected weight change.  HENT: Positive for hearing loss and tinnitus. Negative for sore throat.   Eyes:       Corrective lenses. Normal diabetic eye exam by Dr. [name] 07/13/2010.  Respiratory: Positive for cough.        Ronchial  rattle  Cardiovascular: Negative.   Gastrointestinal: Negative.   Endocrine:       History of diabetes  Musculoskeletal: Positive for neck pain and neck stiffness.       Diffuse joint pains. Shoulders are stable. Generalized arthritis is present. Has right knee pains. Gait stable.  Skin: Negative.   Neurological: Positive for headaches. Negative for dizziness, tremors, seizures, facial asymmetry, speech difficulty, weakness and numbness.  Hematological: Negative.   Psychiatric/Behavioral: Negative.     Filed Vitals:   07/10/13 1246  BP: 158/78  Pulse: 64  Temp: 97.5  F (36.4 C)  TempSrc: Oral  Resp: 18  Height: 5\' 5"  (1.651 m)  Weight: 188 lb 9.6 oz (85.548 kg)  SpO2: 95%   Physical Exam  Constitutional: She is oriented to person, place, and time. She appears well-developed and well-nourished. No distress.  Mildly overweight.  HENT:  Bilateral partial deafness.  Eyes: Conjunctivae and EOM are normal. Pupils are equal, round, and reactive to light. Left eye exhibits no discharge. No scleral icterus.  Neck: Normal range of motion. Neck supple. No JVD present. No tracheal deviation present. No thyromegaly present.  Cardiovascular: Normal rate, regular rhythm and intact distal pulses.  Exam reveals no gallop  and no friction rub.   No murmur heard. Pulmonary/Chest: Effort normal. No respiratory distress. She has no wheezes. She has rales (bronchial rattle).  Abdominal: Soft. Bowel sounds are normal. She exhibits no distension and no mass. There is no tenderness.  Musculoskeletal: She exhibits tenderness. She exhibits no edema.  Reduced ability to raise her arms. Right knee pains.  Lymphadenopathy:    She has no cervical adenopathy.  Neurological: She is alert and oriented to person, place, and time. No cranial nerve deficit. Coordination normal.  Skin: Skin is warm and dry. No rash noted. No erythema. No pallor.  Psychiatric: She has a normal mood and affect. Her behavior is normal. Judgment and thought content normal.     Labs reviewed: Appointment on 07/08/2013  Component Date Value Range Status  . Glucose 07/08/2013 109* 65 - 99 mg/dL Final  . BUN 07/08/2013 12  8 - 27 mg/dL Final  . Creatinine, Ser 07/08/2013 0.98  0.57 - 1.00 mg/dL Final  . GFR calc non Af Amer 07/08/2013 55* >59 mL/min/1.73 Final  . GFR calc Af Amer 07/08/2013 64  >59 mL/min/1.73 Final  . BUN/Creatinine Ratio 07/08/2013 12  11 - 26 Final  . Sodium 07/08/2013 143  134 - 144 mmol/L Final  . Potassium 07/08/2013 4.2  3.5 - 5.2 mmol/L Final  . Chloride 07/08/2013 104  97 - 108 mmol/L Final  . CO2 07/08/2013 24  18 - 29 mmol/L Final  . Calcium 07/08/2013 9.7  8.6 - 10.2 mg/dL Final   Comment: **Effective July 22, 2013 the reference interval**                            for Calcium, Serum will be changing to:                                       Age                Female          Female                                    0 - 10 days        8.6 - 10.4     8.6 - 10.4                              11 days -  1 year        9.2 - 11.0     9.2 - 11.0  2 - 11 years       9.1 - 10.5     9.1 - 10.5                                   12 - 17 years       8.9 - 10.4     8.9 - 10.4                                    18 - 59 years       8.7 - 10.2     8.7 - 10.2                                       >59 years       8.6 - 10.2     8.7 - 10.3  . Total Protein 07/08/2013 6.3  6.0 - 8.5 g/dL Final  . Albumin 07/08/2013 4.2  3.5 - 4.8 g/dL Final  . Globulin, Total 07/08/2013 2.1  1.5 - 4.5 g/dL Final  . Albumin/Globulin Ratio 07/08/2013 2.0  1.1 - 2.5 Final  . Total Bilirubin 07/08/2013 0.6  0.0 - 1.2 mg/dL Final  . Alkaline Phosphatase 07/08/2013 72  39 - 117 IU/L Final  . AST 07/08/2013 51* 0 - 40 IU/L Final  . ALT 07/08/2013 56* 0 - 32 IU/L Final  . Hemoglobin A1C 07/08/2013 6.3* 4.8 - 5.6 % Final   Comment:          Increased risk for diabetes: 5.7 - 6.4                                   Diabetes: >6.4                                   Glycemic control for adults with diabetes: <7.0  . Estimated average glucose 07/08/2013 134   Final  . Creatinine, Ur 07/08/2013 211.1  15.0 - 278.0 mg/dL Final  . Microalbum.,U,Random 07/08/2013 49.8* 0.0 - 17.0 ug/mL Final  . MICROALB/CREAT RATIO 07/08/2013 23.6  0.0 - 30.0 mg/g creat Final      Assessment/Plan Type II or unspecified type diabetes mellitus without mention of complication, not stated as uncontrolled - Plan: Hemoglobin A1c, CMP  Acute bronchitis: improved. Still  coughing some.  Other chronic nonalcoholic liver disease: unchanged  Other nonspecific abnormal serum enzyme levels - Plan: CMP  Unspecified essential hypertension: check home BP more often. Continue current meds.  Unspecified vitamin D deficiency: continue supplement  Other and unspecified hyperlipidemia - Plan: Lipid panel  Pain in joint, lower leg, right - Plan: HYDROcodone-acetaminophen (NORCO) 10-325 MG per tablet

## 2013-07-10 NOTE — Patient Instructions (Signed)
Continue current medications. 

## 2013-08-20 ENCOUNTER — Other Ambulatory Visit: Payer: Self-pay | Admitting: *Deleted

## 2013-09-02 ENCOUNTER — Other Ambulatory Visit: Payer: Self-pay | Admitting: *Deleted

## 2013-09-02 MED ORDER — HYDROCODONE-ACETAMINOPHEN 10-325 MG PO TABS: ORAL_TABLET | ORAL | Status: DC

## 2013-09-09 ENCOUNTER — Other Ambulatory Visit: Payer: Self-pay | Admitting: *Deleted

## 2013-09-09 MED ORDER — HYDROCHLOROTHIAZIDE 25 MG PO TABS: ORAL_TABLET | ORAL | Status: DC

## 2013-09-09 NOTE — Telephone Encounter (Signed)
Patient requested refill

## 2013-09-30 ENCOUNTER — Other Ambulatory Visit: Payer: Self-pay | Admitting: *Deleted

## 2013-09-30 DIAGNOSIS — I10 Essential (primary) hypertension: Secondary | ICD-10-CM

## 2013-09-30 MED ORDER — METOPROLOL SUCCINATE ER 50 MG PO TB24: ORAL_TABLET | ORAL | Status: DC

## 2013-09-30 MED ORDER — AMLODIPINE BESYLATE 2.5 MG PO TABS
ORAL_TABLET | ORAL | Status: DC
Start: 2013-09-30 — End: 2014-03-19

## 2013-09-30 MED ORDER — HYDROCHLOROTHIAZIDE 25 MG PO TABS: ORAL_TABLET | ORAL | Status: DC

## 2013-09-30 MED ORDER — ENALAPRIL MALEATE 20 MG PO TABS: ORAL_TABLET | ORAL | Status: DC

## 2013-09-30 NOTE — Telephone Encounter (Signed)
Patient requested 

## 2013-10-02 ENCOUNTER — Encounter: Payer: Self-pay | Admitting: Internal Medicine

## 2013-10-02 ENCOUNTER — Ambulatory Visit (INDEPENDENT_AMBULATORY_CARE_PROVIDER_SITE_OTHER): Payer: Medicare Other | Admitting: Internal Medicine

## 2013-10-02 VITALS — BP 150/80 | HR 68 | Temp 97.6°F | Wt 184.2 lb

## 2013-10-02 DIAGNOSIS — Z23 Encounter for immunization: Secondary | ICD-10-CM

## 2013-10-02 DIAGNOSIS — J209 Acute bronchitis, unspecified: Secondary | ICD-10-CM

## 2013-10-02 MED ORDER — ZOSTER VACCINE LIVE 19400 UNT/0.65ML ~~LOC~~ SOLR: 0.6500 mL | Freq: Once | SUBCUTANEOUS | Status: DC

## 2013-10-02 MED ORDER — ALBUTEROL SULFATE HFA 108 (90 BASE) MCG/ACT IN AERS: 2.0000 | INHALATION_SPRAY | Freq: Three times a day (TID) | RESPIRATORY_TRACT | Status: DC | PRN

## 2013-10-02 MED ORDER — LEVOFLOXACIN 500 MG PO TABS: 500.0000 mg | ORAL_TABLET | Freq: Every day | ORAL | Status: DC

## 2013-10-02 NOTE — Progress Notes (Signed)
Patient ID: Tamara Yang, female   DOB: [birth year], 80 y.o.   MRN: [id number]    Chief Complaint  Patient presents with  . Acute Visit    cough, congestion  . Immunizations    shingles    Allergies  Allergen Reactions  . Daypro [Oxaprozin]   . Erythromycin   . Talwin [Pentazocine]    HPI 80 y/o female patient is here with complaints of cough and chest congestion for 5 days Has been having cough since last Saturday She has dry cough mostly and is now bringing up green phelghm Has clear nasal discharge but with copious amount Feels stuffed up in her chest No sinus discomfort Denies smoking  ROS Denies any fever or chills at present Got up last night soaked in sweat Appetite is good Denies any earaches Has ocassional itching in ears No nausea or vomiting or diarrhea No urinary complaints  Past Medical History  Diagnosis Date  . Anxiety state, unspecified   . Other malaise and fatigue   . Otalgia, unspecified   . Headache(784.0)   . Dysphagia, unspecified(787.20)   . Unspecified vitamin D deficiency   . Pain in joint, shoulder region   . Pain in joint, pelvic region and thigh   . Pain in joint, lower leg   . Insomnia, unspecified   . Reflux esophagitis   . Type II or unspecified type diabetes mellitus without mention of complication, not stated as uncontrolled   . Carpal tunnel syndrome   . Other and unspecified hyperlipidemia   . Palpitations   . Nonspecific elevation of levels of transaminase or lactic acid dehydrogenase (LDH)   . Unspecified essential hypertension   . Lumbago   . Other chronic nonalcoholic liver disease   . Other nonspecific abnormal serum enzyme levels   . Other malaise and fatigue   . Osteoarthrosis, unspecified whether generalized or localized, unspecified site   . Edema   . Depressive disorder, not elsewhere classified   . Malignant neoplasm of breast (female), unspecified site   . Degeneration of intervertebral disc, site  unspecified    Current Outpatient Prescriptions on File Prior to Visit  Medication Sig Dispense Refill  . amLODipine (NORVASC) 2.5 MG tablet Take one tablet by mouth daily for blood pressure.  30 tablet  5  . aspirin 81 MG tablet Take 81 mg by mouth daily.      . cholecalciferol (VITAMIN D) 1000 UNITS tablet Take two tablets once daily      . cyclobenzaprine (FLEXERIL) 10 MG tablet One up to 3 times daily as needed to help muscle spasm and pain  50 tablet  4  . enalapril (VASOTEC) 20 MG tablet Take one tablet by mouth twice daily To help control blood pressure.  60 tablet  5  . hydrochlorothiazide (HYDRODIURIL) 25 MG tablet Take one tablet daily to help swelling.  30 tablet  5  . HYDROcodone-acetaminophen (NORCO) 10-325 MG per tablet Take one tablet by mouth up to four times a day as needed for pain.  120 tablet  0  . metoprolol (LOPRESSOR) 50 MG tablet Take 50 mg by mouth 2 (two) times daily.       . metoprolol succinate (TOPROL-XL) 50 MG 24 hr tablet Take one tablet twice a day for blood pressure.  60 tablet  5  . omeprazole (PRILOSEC) 20 MG capsule 20 mg. Take one tablet at bedtime to reduce stomach acid and to help heartburn.       No current facility-administered  medications on file prior to visit.   Past Surgical History  Procedure Laterality Date  . Dilation and curettage of uterus  1980  . Abdominal hysterectomy  1981  . Breast surgery  1982    cyst removed, left  . Cholecystectomy  1987  . Lumbar spine surgery  2002  . Total hip arthroplasty  2003    bilateral  . Back surgery  2003    L4-L5  . Breast lumpectomy Left 1994    lymph cancer    History   Social History  . Marital Status: Widowed    Spouse Name: N/A    Number of Children: N/A  . Years of Education: N/A   Occupational History  . Not on file.   Social History Main Topics  . Smoking status: Never Smoker   . Smokeless tobacco: Not on file  . Alcohol Use: No  . Drug Use: No  . Sexual Activity: Not on  file   Other Topics Concern  . Not on file   Social History Narrative  . No narrative on file    Physical exam BP 150/80  Pulse 68  Temp(Src) 97.6 F (36.4 C) (Oral)  Wt 184 lb 3.2 oz (83.553 kg)  General- elderly female in no acute distress Head- atraumatic, normocephalic Eyes- PERRLA, EOMI, no pallor, no icterus, no discharge Ears- left ear normal tympanic membrane and normal external ear canal , right ear normal tympanic membrane and normal external ear canal Neck- no lymphadenopathy, no thyromegaly Nose- normal nasaal mucosa, no maxillary sinus tenderness, no frontal sinus tenderness Mouth- normal mucus membrane, no oral thrush, mild oropharyngeal erythema Cardiovascular- normal s1,s2, no murmurs/ rubs/ gallops, normal distal pulses Respiratory- bilateral decreased air entry at bases, no wheeze, no rhonchi, no crackles Abdomen- bowel sounds present, soft, non tender Musculoskeletal- able to move all 4 extremities Skin- warm and dry Psychiatry- alert and oriented to person, place and time, normal mood and affect  Assessment/plan . 1. Need for prophylactic vaccination and inoculation against other viral diseases(V04.89) Advised to take shingles vaccine few weeks after recovering from current infection - zoster vaccine live, PF, (ZOSTAVAX) 10272 UNT/0.65ML injection; Inject 19,400 Units into the skin once.  Dispense: 1 each; Refill: 0  2. Acute bronchitis Start her on levofloxacin 500 mg daily for a week with prn albuterol to help with her breathing. Will get cxr if no improvement in few days. Monitor for fever and worsening of symptoms - DG Chest 2 View; Future

## 2013-10-07 ENCOUNTER — Other Ambulatory Visit: Payer: Self-pay

## 2013-10-07 ENCOUNTER — Ambulatory Visit
Admission: RE | Admit: 2013-10-07 | Discharge: 2013-10-07 | Disposition: A | Discharge status: Home / Self Care | Payer: Medicare Other | Source: Ambulatory Visit | Attending: Internal Medicine | Admitting: Internal Medicine

## 2013-10-07 DIAGNOSIS — J209 Acute bronchitis, unspecified: Secondary | ICD-10-CM

## 2013-10-07 MED ORDER — HYDROCODONE-ACETAMINOPHEN 10-325 MG PO TABS: ORAL_TABLET | ORAL | Status: DC

## 2013-10-07 NOTE — Telephone Encounter (Signed)
Patient aware rx available for pick-up

## 2013-10-09 ENCOUNTER — Ambulatory Visit: Payer: Medicare Other | Admitting: Internal Medicine

## 2013-11-12 ENCOUNTER — Encounter: Payer: Self-pay | Admitting: Internal Medicine

## 2013-11-12 ENCOUNTER — Ambulatory Visit (INDEPENDENT_AMBULATORY_CARE_PROVIDER_SITE_OTHER): Payer: Medicare Other | Admitting: Internal Medicine

## 2013-11-12 VITALS — BP 152/80 | HR 69 | Temp 97.6°F | Resp 18 | Ht 65.0 in | Wt 190.2 lb

## 2013-11-12 DIAGNOSIS — R5381 Other malaise: Secondary | ICD-10-CM | POA: Insufficient documentation

## 2013-11-12 DIAGNOSIS — K7689 Other specified diseases of liver: Secondary | ICD-10-CM

## 2013-11-12 DIAGNOSIS — J209 Acute bronchitis, unspecified: Secondary | ICD-10-CM

## 2013-11-12 DIAGNOSIS — E119 Type 2 diabetes mellitus without complications: Secondary | ICD-10-CM

## 2013-11-12 DIAGNOSIS — E785 Hyperlipidemia, unspecified: Secondary | ICD-10-CM

## 2013-11-12 DIAGNOSIS — R748 Abnormal levels of other serum enzymes: Secondary | ICD-10-CM

## 2013-11-12 DIAGNOSIS — R5383 Other fatigue: Secondary | ICD-10-CM

## 2013-11-12 DIAGNOSIS — I10 Essential (primary) hypertension: Secondary | ICD-10-CM

## 2013-11-12 DIAGNOSIS — M25519 Pain in unspecified shoulder: Secondary | ICD-10-CM

## 2013-11-12 MED ORDER — CYCLOBENZAPRINE HCL 10 MG PO TABS: ORAL_TABLET | ORAL | Status: DC

## 2013-11-12 MED ORDER — HYDROCODONE-ACETAMINOPHEN 10-325 MG PO TABS: ORAL_TABLET | ORAL | Status: DC

## 2013-11-12 NOTE — Progress Notes (Signed)
Patient ID: Tamara Yang, female   DOB: 03-30-1933, 80 y.o.   MRN: [id number]    Location:  [facility]   Place of Service: OFFICE    Allergies  Allergen Reactions  . Daypro [Oxaprozin]   . Erythromycin   . Talwin [Pentazocine]     Chief Complaint  Patient presents with  . Follow-up    HPI:  Unspecified essential hypertension -mild elevation of SBP  Acute bronchitis: Resolved  Type II or unspecified type diabetes mellitus without mention of complication, not stated as uncontrolled : controlled  Other and unspecified hyperlipidemia: recheck  Other chronic nonalcoholic liver disease: no nausea. Hx fatty liveer  Other nonspecific abnormal serum enzyme levels: associated with fatty liver  Pain in joint, shoulder region - continues to be a daily pain in both shoulders.: benefits from cyclobenzaprine (FLEXERIL) 10 MG tablet, HYDROcodone-acetaminophen (NORCO) 10-325 MG per tablet  Other malaise and fatigue: generally weak and tired. She thinks it may have something to do with her age.    Medications: Patient's Medications  New Prescriptions   No medications on file  Previous Medications   ALBUTEROL (PROVENTIL HFA;VENTOLIN HFA) 108 (90 BASE) MCG/ACT INHALER    Inhale 2 puffs into the lungs every 8 (eight) hours as needed for wheezing or shortness of breath.   AMLODIPINE (NORVASC) 2.5 MG TABLET    Take one tablet by mouth daily for blood pressure.   ASPIRIN 81 MG TABLET    Take 81 mg by mouth daily.   CHOLECALCIFEROL (VITAMIN D) 1000 UNITS TABLET    Take two tablets once daily   ENALAPRIL (VASOTEC) 20 MG TABLET    Take one tablet by mouth twice daily To help control blood pressure.   HYDROCHLOROTHIAZIDE (HYDRODIURIL) 25 MG TABLET    Take one tablet daily to help swelling.   METOPROLOL SUCCINATE (TOPROL-XL) 50 MG 24 HR TABLET    Take one tablet twice a day for blood pressure.   OMEPRAZOLE (PRILOSEC) 20 MG CAPSULE    20 mg. Take one tablet at bedtime to reduce stomach acid and to  help heartburn.   ZOSTER VACCINE LIVE, PF, (ZOSTAVAX) 16109 UNT/0.65ML INJECTION    Inject 19,400 Units into the skin once.  Modified Medications   Modified Medication Previous Medication   CYCLOBENZAPRINE (FLEXERIL) 10 MG TABLET cyclobenzaprine (FLEXERIL) 10 MG tablet      One up to 3 times daily as needed to help muscle spasm and pain    One up to 3 times daily as needed to help muscle spasm and pain   HYDROCODONE-ACETAMINOPHEN (NORCO) 10-325 MG PER TABLET HYDROcodone-acetaminophen (NORCO) 10-325 MG per tablet      Take one tablet by mouth up to four times a day as needed for pain.    Take one tablet by mouth up to four times a day as needed for pain.  Discontinued Medications   LEVOFLOXACIN (LEVAQUIN) 500 MG TABLET    Take 1 tablet (500 mg total) by mouth daily.   METOPROLOL (LOPRESSOR) 50 MG TABLET    Take 50 mg by mouth 2 (two) times daily.      Review of Systems  Constitutional: Negative for fever, activity change, appetite change, fatigue and unexpected weight change.  HENT: Positive for hearing loss and tinnitus. Negative for sore throat.   Eyes:       Corrective lenses. Normal diabetic eye exam by Dr. [name] 07/13/2010.  Respiratory: Positive for cough.        Ronchial  rattle  Cardiovascular: Negative.  Gastrointestinal: Negative.   Endocrine:       History of diabetes  Genitourinary: Negative.   Musculoskeletal: Positive for neck pain and neck stiffness.       Diffuse joint pains. Shoulders are stable. Generalized arthritis is present. Has right knee pains. Gait stable.  Skin: Negative.   Allergic/Immunologic: Negative.   Neurological: Positive for headaches. Negative for dizziness, tremors, seizures, facial asymmetry, speech difficulty, weakness and numbness.  Hematological: Negative.   Psychiatric/Behavioral: Negative.     Filed Vitals:   11/12/13 1512  BP: 152/80  Pulse: 69  Temp: 97.6 F (36.4 C)  TempSrc: Oral  Resp: 18  Height: 5\' 5"  (1.651 m)  Weight: 190  lb 3.2 oz (86.274 kg)  SpO2: 95%   Body mass index is 31.65 kg/(m^2).  Physical Exam  Constitutional: She is oriented to person, place, and time. She appears well-developed and well-nourished. No distress.  Mildly overweight.  HENT:  Bilateral partial deafness.  Eyes: Conjunctivae and EOM are normal. Pupils are equal, round, and reactive to light. Left eye exhibits no discharge. No scleral icterus.  Neck: Normal range of motion. Neck supple. No JVD present. No tracheal deviation present. No thyromegaly present.  Cardiovascular: Normal rate, regular rhythm and intact distal pulses.  Exam reveals no gallop and no friction rub.   No murmur heard. Pulmonary/Chest: Effort normal. No respiratory distress. She has no wheezes. She has rales (bronchial rattle).  Abdominal: Soft. Bowel sounds are normal. She exhibits no distension and no mass. There is no tenderness.  Musculoskeletal: She exhibits tenderness. She exhibits no edema.  Reduced ability to raise her arms. Right knee pains.  Lymphadenopathy:    She has no cervical adenopathy.  Neurological: She is alert and oriented to person, place, and time. No cranial nerve deficit. Coordination normal.  Skin: Skin is warm and dry. No rash noted. No erythema. No pallor.  Psychiatric: She has a normal mood and affect. Her behavior is normal. Judgment and thought content normal.     Labs reviewed: No visits with results within 3 Month(s) from this visit. Latest known visit with results is:  Appointment on 07/08/2013  Component Date Value Ref Range Status  . Glucose 07/08/2013 109* 65 - 99 mg/dL Final  . BUN 07/08/2013 12  8 - 27 mg/dL Final  . Creatinine, Ser 07/08/2013 0.98  0.57 - 1.00 mg/dL Final  . GFR calc non Af Amer 07/08/2013 55* >59 mL/min/1.73 Final  . GFR calc Af Amer 07/08/2013 64  >59 mL/min/1.73 Final  . BUN/Creatinine Ratio 07/08/2013 12  11 - 26 Final  . Sodium 07/08/2013 143  134 - 144 mmol/L Final  . Potassium 07/08/2013 4.2   3.5 - 5.2 mmol/L Final  . Chloride 07/08/2013 104  97 - 108 mmol/L Final  . CO2 07/08/2013 24  18 - 29 mmol/L Final  . Calcium 07/08/2013 9.7  8.6 - 10.2 mg/dL Final   Comment: **Effective July 22, 2013 the reference interval**                            for Calcium, Serum will be changing to:                                       Age                Female  Female                                    0 - 10 days        8.6 - 10.4     8.6 - 10.4                              11 days -  1 year        9.2 - 11.0     9.2 - 11.0                                    2 - 11 years       9.1 - 10.5     9.1 - 10.5                                   12 - 17 years       8.9 - 10.4     8.9 - 10.4                                   18 - 59 years       8.7 - 10.2     8.7 - 10.2                                       >59 years       8.6 - 10.2     8.7 - 10.3  . Total Protein 07/08/2013 6.3  6.0 - 8.5 g/dL Final  . Albumin 07/08/2013 4.2  3.5 - 4.8 g/dL Final  . Globulin, Total 07/08/2013 2.1  1.5 - 4.5 g/dL Final  . Albumin/Globulin Ratio 07/08/2013 2.0  1.1 - 2.5 Final  . Total Bilirubin 07/08/2013 0.6  0.0 - 1.2 mg/dL Final  . Alkaline Phosphatase 07/08/2013 72  39 - 117 IU/L Final  . AST 07/08/2013 51* 0 - 40 IU/L Final  . ALT 07/08/2013 56* 0 - 32 IU/L Final  . Hemoglobin A1C 07/08/2013 6.3* 4.8 - 5.6 % Final   Comment:          Increased risk for diabetes: 5.7 - 6.4                                   Diabetes: >6.4                                   Glycemic control for adults with diabetes: <7.0  . Estimated average glucose 07/08/2013 134   Final  . Creatinine, Ur 07/08/2013 211.1  15.0 - 278.0 mg/dL Final  . Microalbum.,U,Random 07/08/2013 49.8* 0.0 - 17.0 ug/mL Final  . MICROALB/CREAT RATIO 07/08/2013 23.6  0.0 - 30.0 mg/g creat Final      Assessment/Plan  1. Unspecified essential hypertension Mild sbp elevation - Lipid panel; Future - Comprehensive metabolic panel; Future  2. Acute  bronchitis resolved  3. Type II or unspecified type diabetes mellitus without mention of complication, not stated as uncontrolled - Hemoglobin A1c; Future - Comprehensive metabolic panel; Future - Microalbumin / creatinine urine ratio; Future  4. Other and unspecified hyperlipidemia Recheck lab  5. Other chronic nonalcoholic liver disease obsereve  6. Other nonspecific abnormal serum enzyme levels observe  7. Pain in joint, shoulder region - cyclobenzaprine (FLEXERIL) 10 MG tablet; One up to 3 times daily as needed to help muscle spasm and pain  Dispense: 50 tablet; Refill: 4 - HYDROcodone-acetaminophen (NORCO) 10-325 MG per tablet; Take one tablet by mouth up to four times a day as needed for pain.  Dispense: 120 tablet; Refill: 0  8. Other malaise and fatigue obseeerve

## 2013-11-12 NOTE — Patient Instructions (Signed)
Continue current medications. 

## 2013-12-16 ENCOUNTER — Other Ambulatory Visit: Payer: Self-pay | Admitting: *Deleted

## 2013-12-16 DIAGNOSIS — M25519 Pain in unspecified shoulder: Secondary | ICD-10-CM

## 2013-12-16 MED ORDER — HYDROCODONE-ACETAMINOPHEN 10-325 MG PO TABS: ORAL_TABLET | ORAL | Status: DC

## 2013-12-16 NOTE — Telephone Encounter (Signed)
Patient requested 

## 2014-01-22 ENCOUNTER — Other Ambulatory Visit: Payer: Self-pay | Admitting: *Deleted

## 2014-01-22 MED ORDER — HYDROCODONE-ACETAMINOPHEN 10-325 MG PO TABS: ORAL_TABLET | ORAL | Status: DC

## 2014-01-22 NOTE — Telephone Encounter (Signed)
Patient Notified

## 2014-02-27 ENCOUNTER — Other Ambulatory Visit: Payer: Self-pay | Admitting: *Deleted

## 2014-02-27 MED ORDER — HYDROCODONE-ACETAMINOPHEN 10-325 MG PO TABS: ORAL_TABLET | ORAL | Status: DC

## 2014-02-27 NOTE — Telephone Encounter (Signed)
Patient Requested 

## 2014-03-17 ENCOUNTER — Other Ambulatory Visit: Payer: Medicare Other

## 2014-03-17 DIAGNOSIS — E119 Type 2 diabetes mellitus without complications: Secondary | ICD-10-CM

## 2014-03-17 DIAGNOSIS — I10 Essential (primary) hypertension: Secondary | ICD-10-CM

## 2014-03-18 LAB — COMPREHENSIVE METABOLIC PANEL
ALT: 76 IU/L — AB (ref 0–32)
AST: 64 IU/L — AB (ref 0–40)
Albumin/Globulin Ratio: 1.8 (ref 1.1–2.5)
Albumin: 4.3 g/dL (ref 3.5–4.8)
Alkaline Phosphatase: 63 IU/L (ref 39–117)
BUN / CREAT RATIO: 19 (ref 11–26)
BUN: 16 mg/dL (ref 8–27)
CO2: 22 mmol/L (ref 18–29)
Calcium: 9.9 mg/dL (ref 8.7–10.3)
Chloride: 102 mmol/L (ref 97–108)
Creatinine, Ser: 0.84 mg/dL (ref 0.57–1.00)
GFR calc Af Amer: 77 mL/min/{1.73_m2} (ref >59)
GFR calc non Af Amer: 67 mL/min/{1.73_m2} (ref >59)
GLOBULIN, TOTAL: 2.4 g/dL (ref 1.5–4.5)
Glucose: 116 mg/dL — ABNORMAL HIGH (ref 65–99)
POTASSIUM: 4.5 mmol/L (ref 3.5–5.2)
SODIUM: 142 mmol/L (ref 134–144)
Total Bilirubin: 0.6 mg/dL (ref 0.0–1.2)
Total Protein: 6.7 g/dL (ref 6.0–8.5)

## 2014-03-18 LAB — LIPID PANEL
CHOLESTEROL TOTAL: 217 mg/dL — AB (ref 100–199)
Chol/HDL Ratio: 4.4 ratio units (ref 0.0–4.4)
HDL: 49 mg/dL (ref >39)
LDL Calculated: 140 mg/dL — ABNORMAL HIGH (ref 0–99)
Triglycerides: 141 mg/dL (ref 0–149)
VLDL Cholesterol Cal: 28 mg/dL (ref 5–40)

## 2014-03-18 LAB — HEMOGLOBIN A1C
Est. average glucose Bld gHb Est-mCnc: 134 mg/dL
Hgb A1c MFr Bld: 6.3 % — ABNORMAL HIGH (ref 4.8–5.6)

## 2014-03-18 LAB — MICROALBUMIN / CREATININE URINE RATIO
Creatinine, Ur: 113.7 mg/dL (ref 15.0–278.0)
MICROALB/CREAT RATIO: 13.2 mg/g creat (ref 0.0–30.0)
MICROALBUM., U, RANDOM: 15 ug/mL (ref 0.0–17.0)

## 2014-03-19 ENCOUNTER — Encounter: Payer: Self-pay | Admitting: Internal Medicine

## 2014-03-19 ENCOUNTER — Ambulatory Visit (INDEPENDENT_AMBULATORY_CARE_PROVIDER_SITE_OTHER): Payer: Medicare Other | Admitting: Internal Medicine

## 2014-03-19 VITALS — BP 180/92 | HR 71 | Temp 97.9°F | Resp 18 | Ht 65.0 in | Wt 186.0 lb

## 2014-03-19 DIAGNOSIS — K7689 Other specified diseases of liver: Secondary | ICD-10-CM

## 2014-03-19 DIAGNOSIS — E119 Type 2 diabetes mellitus without complications: Secondary | ICD-10-CM

## 2014-03-19 DIAGNOSIS — E785 Hyperlipidemia, unspecified: Secondary | ICD-10-CM

## 2014-03-19 DIAGNOSIS — M25519 Pain in unspecified shoulder: Secondary | ICD-10-CM

## 2014-03-19 DIAGNOSIS — I10 Essential (primary) hypertension: Secondary | ICD-10-CM

## 2014-03-19 MED ORDER — METOPROLOL SUCCINATE ER 50 MG PO TB24: ORAL_TABLET | ORAL | Status: DC

## 2014-03-19 MED ORDER — AMLODIPINE BESYLATE 5 MG PO TABS: ORAL_TABLET | ORAL | Status: DC

## 2014-03-19 MED ORDER — ENALAPRIL MALEATE 20 MG PO TABS: ORAL_TABLET | ORAL | Status: DC

## 2014-03-19 NOTE — Progress Notes (Signed)
Patient ID: Tamara Yang, female   DOB: 07/24/1932, 80 y.o.   MRN: [id number]    Location:    [facility]  Place of Service:  OFFICE    Allergies  Allergen Reactions  . Daypro [Oxaprozin]   . Erythromycin   . Talwin [Pentazocine]     Chief Complaint  Patient presents with  . Medical Management of Chronic Issues    right arm pain  x 2 weeks    HPI:  Right arm pain for the last 2-3 weeks. Mainly in the mid arm with some radiation to shoulder. Used heat and Blue Emu up to 5 times qd.. Sometimes they seem to help. Also used a copper bracelelt but it was small and started to hurt her wrist, so she stopped using it.  Unspecified essential hypertension - significantly higher than in the past  Type II or unspecified type diabetes mellitus without mention of complication, not stated as uncontrolled- controlled  Other chronic nonalcoholic liver disease: stable  Other and unspecified : LDL is a little high    Medications: Patient's Medications  New Prescriptions   No medications on file  Previous Medications   AMLODIPINE (NORVASC) 2.5 MG TABLET    Take one tablet by mouth daily for blood pressure.   ASPIRIN 81 MG TABLET    Take 81 mg by mouth daily.   CHOLECALCIFEROL (VITAMIN D) 1000 UNITS TABLET    Take two tablets once daily   CYCLOBENZAPRINE (FLEXERIL) 10 MG TABLET    One up to 3 times daily as needed to help muscle spasm and pain   ENALAPRIL (VASOTEC) 20 MG TABLET    Take one tablet by mouth twice daily To help control blood pressure.   HYDROCHLOROTHIAZIDE (HYDRODIURIL) 25 MG TABLET    Take one tablet daily to help swelling.   HYDROCODONE-ACETAMINOPHEN (NORCO) 10-325 MG PER TABLET    Take one tablet by mouth up to four times a day as needed for pain.   METOPROLOL SUCCINATE (TOPROL-XL) 50 MG 24 HR TABLET    Take one tablet twice a day for blood pressure.   OMEPRAZOLE (PRILOSEC) 20 MG CAPSULE    20 mg. Take one tablet at bedtime to reduce stomach acid and to help heartburn.   ZOSTER  VACCINE LIVE, PF, (ZOSTAVAX) 13244 UNT/0.65ML INJECTION    Inject 19,400 Units into the skin once.  Modified Medications   No medications on file  Discontinued Medications   ALBUTEROL (PROVENTIL HFA;VENTOLIN HFA) 108 (90 BASE) MCG/ACT INHALER    Inhale 2 puffs into the lungs every 8 (eight) hours as needed for wheezing or shortness of breath.     Review of Systems  Constitutional: Negative for fever, activity change, appetite change, fatigue and unexpected weight change.  HENT: Positive for hearing loss and tinnitus. Negative for sore throat.   Eyes:       Corrective lenses. Normal diabetic eye exam by Dr. [name] 07/13/2010.  Respiratory: Negative for cough.   Cardiovascular: Negative.   Gastrointestinal: Negative.   Endocrine:       History of diabetes  Genitourinary: Negative.   Musculoskeletal: Positive for neck pain and neck stiffness.       Diffuse joint pains. Increased pain in the right shoulder. Generalized arthritis is present. Has right knee pains. Gait stable.  Skin: Negative.   Allergic/Immunologic: Negative.   Neurological: Positive for headaches. Negative for dizziness, tremors, seizures, facial asymmetry, speech difficulty, weakness and numbness.  Hematological: Negative.   Psychiatric/Behavioral: Negative.  Filed Vitals:   03/19/14 1547  BP: 180/92  Pulse: 71  Temp: 97.9 F (36.6 C)  TempSrc: Oral  Resp: 18  Height: 5\' 5"  (1.651 m)  Weight: 186 lb (84.369 kg)  SpO2: 98%   Body mass index is 30.95 kg/(m^2).  Physical Exam  Constitutional: She is oriented to person, place, and time. She appears well-developed and well-nourished. No distress.  Mildly overweight.  HENT:  Bilateral partial deafness.  Eyes: Conjunctivae and EOM are normal. Pupils are equal, round, and reactive to light. Left eye exhibits no discharge. No scleral icterus.  Neck: Normal range of motion. Neck supple. No JVD present. No tracheal deviation present. No thyromegaly present.    Cardiovascular: Normal rate, regular rhythm and intact distal pulses.  Exam reveals no gallop and no friction rub.   No murmur heard. Pulmonary/Chest: Effort normal. No respiratory distress. She has no wheezes. She has no rales.  Abdominal: Soft. Bowel sounds are normal. She exhibits no distension and no mass. There is no tenderness.  Musculoskeletal: She exhibits tenderness. She exhibits no edema.  Reduced ability to raise her arms. Right shoulder pain. Right knee pains.  Lymphadenopathy:    She has no cervical adenopathy.  Neurological: She is alert and oriented to person, place, and time. No cranial nerve deficit. Coordination normal.  Skin: Skin is warm and dry. No rash noted. No erythema. No pallor.  Psychiatric: She has a normal mood and affect. Her behavior is normal. Judgment and thought content normal.     Labs reviewed: Appointment on 03/17/2014  Component Date Value Ref Range Status  . Hemoglobin A1C 03/17/2014 6.3* 4.8 - 5.6 % Final   Comment:          Increased risk for diabetes: 5.7 - 6.4                                   Diabetes: >6.4                                   Glycemic control for adults with diabetes: <7.0  . Estimated average glucose 03/17/2014 134   Final  . Cholesterol, Total 03/17/2014 217* 100 - 199 mg/dL Final  . Triglycerides 03/17/2014 141  0 - 149 mg/dL Final  . HDL 03/17/2014 49  >39 mg/dL Final   Comment: According to ATP-III Guidelines, HDL-C >59 mg/dL is considered a                          negative risk factor for CHD.  Marland Kitchen VLDL Cholesterol Cal 03/17/2014 28  5 - 40 mg/dL Final  . LDL Calculated 03/17/2014 140* 0 - 99 mg/dL Final  . Chol/HDL Ratio 03/17/2014 4.4  0.0 - 4.4 ratio units Final   Comment:                                   T. Chol/HDL Ratio  Men  Women                                                        1/2 Avg.Risk  3.4    3.3                                                             Avg.Risk  5.0    4.4                                                         2X Avg.Risk  9.6    7.1                                                         3X Avg.Risk 23.4   11.0  . Glucose 03/17/2014 116* 65 - 99 mg/dL Final  . BUN 03/17/2014 16  8 - 27 mg/dL Final  . Creatinine, Ser 03/17/2014 0.84  0.57 - 1.00 mg/dL Final  . GFR calc non Af Amer 03/17/2014 67  >59 mL/min/1.73 Final  . GFR calc Af Amer 03/17/2014 77  >59 mL/min/1.73 Final  . BUN/Creatinine Ratio 03/17/2014 19  11 - 26 Final  . Sodium 03/17/2014 142  134 - 144 mmol/L Final  . Potassium 03/17/2014 4.5  3.5 - 5.2 mmol/L Final  . Chloride 03/17/2014 102  97 - 108 mmol/L Final  . CO2 03/17/2014 22  18 - 29 mmol/L Final  . Calcium 03/17/2014 9.9  8.7 - 10.3 mg/dL Final  . Total Protein 03/17/2014 6.7  6.0 - 8.5 g/dL Final  . Albumin 03/17/2014 4.3  3.5 - 4.8 g/dL Final  . Globulin, Total 03/17/2014 2.4  1.5 - 4.5 g/dL Final  . Albumin/Globulin Ratio 03/17/2014 1.8  1.1 - 2.5 Final  . Total Bilirubin 03/17/2014 0.6  0.0 - 1.2 mg/dL Final  . Alkaline Phosphatase 03/17/2014 63  39 - 117 IU/L Final  . AST 03/17/2014 64* 0 - 40 IU/L Final  . ALT 03/17/2014 76* 0 - 32 IU/L Final  . Creatinine, Ur 03/17/2014 113.7  15.0 - 278.0 mg/dL Final  . Microalbum.,U,Random 03/17/2014 15.0  0.0 - 17.0 ug/mL Final  . MICROALB/CREAT RATIO 03/17/2014 13.2  0.0 - 30.0 mg/g creat Final    Assessment/Plan Unspecified essential hypertension - Plan: metoprolol succinate (TOPROL-XL) 50 MG 24 hr tablet, amLODipine (NORVASC) 5 MG tablet, enalapril (VASOTEC) 20 MG tablet, DISCONTINUED: enalapril (VASOTEC) 20 MG tablet  Type II or unspecified type diabetes mellitus without mention of complication, not stated as uncontrolled: adequately controlled  Pain in joint, shoulder region, unspecified laterality: continue current pain medication. Will refer to ortho when she desires.  Other chronic nonalcoholic liver disease:  stable  Other and unspecified hyperlipidemia: continue to emphasized dietary compliance and weight loss.  Her grandson is losing weight and she thinks she can get some help from him.

## 2014-03-28 ENCOUNTER — Other Ambulatory Visit: Payer: Self-pay | Admitting: Internal Medicine

## 2014-03-28 MED ORDER — ENALAPRIL MALEATE 20 MG PO TABS: ORAL_TABLET | ORAL | Status: DC

## 2014-04-02 ENCOUNTER — Other Ambulatory Visit: Payer: Self-pay | Admitting: *Deleted

## 2014-04-02 MED ORDER — HYDROCODONE-ACETAMINOPHEN 10-325 MG PO TABS: ORAL_TABLET | ORAL | Status: DC

## 2014-04-02 NOTE — Telephone Encounter (Signed)
Patient Requested and will pick up 

## 2014-04-03 ENCOUNTER — Other Ambulatory Visit: Payer: Self-pay | Admitting: Internal Medicine

## 2014-05-07 ENCOUNTER — Encounter: Payer: Self-pay | Admitting: Internal Medicine

## 2014-05-07 ENCOUNTER — Ambulatory Visit (INDEPENDENT_AMBULATORY_CARE_PROVIDER_SITE_OTHER): Payer: Medicare Other | Admitting: Internal Medicine

## 2014-05-07 VITALS — BP 126/80 | HR 62 | Temp 98.1°F | Resp 10 | Ht 65.5 in | Wt 187.0 lb

## 2014-05-07 DIAGNOSIS — R748 Abnormal levels of other serum enzymes: Secondary | ICD-10-CM

## 2014-05-07 DIAGNOSIS — I10 Essential (primary) hypertension: Secondary | ICD-10-CM

## 2014-05-07 DIAGNOSIS — E785 Hyperlipidemia, unspecified: Secondary | ICD-10-CM

## 2014-05-07 DIAGNOSIS — E119 Type 2 diabetes mellitus without complications: Secondary | ICD-10-CM

## 2014-05-07 DIAGNOSIS — M25561 Pain in right knee: Secondary | ICD-10-CM

## 2014-05-07 DIAGNOSIS — M25519 Pain in unspecified shoulder: Secondary | ICD-10-CM

## 2014-05-07 MED ORDER — HYDROCODONE-ACETAMINOPHEN 10-325 MG PO TABS: ORAL_TABLET | ORAL | Status: DC

## 2014-05-07 MED ORDER — CYCLOBENZAPRINE HCL 10 MG PO TABS: ORAL_TABLET | ORAL | Status: DC

## 2014-05-07 NOTE — Progress Notes (Addendum)
Patient ID: Tamara Yang, female   DOB: 1932/08/30, 80 y.o.   MRN: [id number]    Facility  [facility]    Place of Service:   OFFICE   Allergies  Allergen Reactions  . Daypro [Oxaprozin]   . Erythromycin   . Talwin [Pentazocine]     Chief Complaint  Patient presents with  . Medical Management of Chronic Issues    2 month follow-up on right shoulder (stable), arthritis in right knee, labs completed on 03/16/14  . 6CIT Dementia Screening    Scored (0)- Normal     HPI:  Essential hypertension: controlled  Pain in joint, lower leg, right: chronic. Worse with rainy or cold weather. Aching today.  Pain in joint, shoulder region, unspecified laterality - chronic  Type 2 diabetes mellitus without complication - controlled  Hyperlipidemia - controlled  Liver enzyme elevation: unchanged and llkely related to hepatic steatosis    Medications: Patient's Medications  New Prescriptions   No medications on file  Previous Medications   AMLODIPINE (NORVASC) 5 MG TABLET    One daily to control BP   ASPIRIN 81 MG TABLET    Take 81 mg by mouth daily.   CHOLECALCIFEROL (VITAMIN D) 1000 UNITS TABLET    Take two tablets once daily   CYCLOBENZAPRINE (FLEXERIL) 10 MG TABLET    One up to 3 times daily as needed to help muscle spasm and pain   ENALAPRIL (VASOTEC) 20 MG TABLET    Take one tablet by mouth twice daily To help control blood pressure.   HYDROCHLOROTHIAZIDE (HYDRODIURIL) 25 MG TABLET    Take one tablet daily to help swelling.   HYDROCODONE-ACETAMINOPHEN (NORCO) 10-325 MG PER TABLET    Take one tablet by mouth up to four times a day as needed for pain.   METOPROLOL SUCCINATE (TOPROL-XL) 50 MG 24 HR TABLET    Take one tablet twice a day for blood pressure.   OMEPRAZOLE (PRILOSEC) 20 MG CAPSULE    20 mg. Take one tablet at bedtime to reduce stomach acid and to help heartburn.  Modified Medications   No medications on file  Discontinued Medications   AMLODIPINE (NORVASC) 2.5 MG TABLET     TAKE ONE TABLET BY MOUTH DAILY FOR BLOOD PRESSURE.   ENALAPRIL (VASOTEC) 20 MG TABLET    TAKE 1 TABLET TWICE A DAY   ZOSTER VACCINE LIVE, PF, (ZOSTAVAX) 92119 UNT/0.65ML INJECTION    Inject 19,400 Units into the skin once.     Review of Systems  Constitutional: Negative for fever, activity change, appetite change, fatigue and unexpected weight change.  HENT: Positive for hearing loss and tinnitus. Negative for sore throat.   Eyes:       Corrective lenses. Normal diabetic eye exam by Dr. Katy Yang 07/13/2010.  Respiratory: Negative for cough.   Cardiovascular: Negative.   Gastrointestinal: Negative.   Endocrine:       History of diabetes  Genitourinary: Negative.   Musculoskeletal: Positive for neck pain and neck stiffness.       Diffuse joint pains. Increased pain in the right shoulder. Generalized arthritis is present. Has right knee pains. Gait stable.  Skin: Negative.   Allergic/Immunologic: Negative.   Neurological: Positive for headaches. Negative for dizziness, tremors, seizures, facial asymmetry, speech difficulty, weakness and numbness.  Hematological: Negative.   Psychiatric/Behavioral: Negative.     Filed Vitals:   05/07/14 1227  BP: 126/80  Pulse: 62  Temp: 98.1 F (36.7 C)  TempSrc: Oral  Resp: 10  Height:  5' 5.5" (1.664 m)  Weight: 187 lb (84.823 kg)  SpO2: 98%   Body mass index is 30.63 kg/(m^2).  Physical Exam  Constitutional: She is oriented to person, place, and time. She appears well-developed and well-nourished. No distress.  Mildly overweight.  HENT:  Bilateral partial deafness.  Eyes: Conjunctivae and EOM are normal. Pupils are equal, round, and reactive to light. Left eye exhibits no discharge. No scleral icterus.  Neck: Normal range of motion. Neck supple. No JVD present. No tracheal deviation present. No thyromegaly present.  Cardiovascular: Normal rate, regular rhythm and intact distal pulses.  Exam reveals no gallop and no friction rub.   No  murmur heard. Pulmonary/Chest: Effort normal. No respiratory distress. She has no wheezes. She has no rales.  Abdominal: Soft. Bowel sounds are normal. She exhibits no distension and no mass. There is no tenderness.  Musculoskeletal: She exhibits tenderness. She exhibits no edema.  Reduced ability to raise her arms. Right shoulder pain. Right knee pains. Right knee effusion.  Lymphadenopathy:    She has no cervical adenopathy.  Neurological: She is alert and oriented to person, place, and time. No cranial nerve deficit. Coordination normal.  6- CIT score was 0  Skin: Skin is warm and dry. No rash noted. No erythema. No pallor.  Psychiatric: She has a normal mood and affect. Her behavior is normal. Judgment and thought content normal.     Labs reviewed: Appointment on 03/17/2014  Component Date Value Ref Range Status  . Hgb A1c MFr Bld 03/17/2014 6.3* 4.8 - 5.6 % Final   Comment:          Increased risk for diabetes: 5.7 - 6.4                                   Diabetes: >6.4                                   Glycemic control for adults with diabetes: <7.0  . Est. average glucose Bld gHb Est-m* 03/17/2014 134   Final  . Cholesterol, Total 03/17/2014 217* 100 - 199 mg/dL Final  . Triglycerides 03/17/2014 141  0 - 149 mg/dL Final  . HDL 03/17/2014 49  >39 mg/dL Final   Comment: According to ATP-III Guidelines, HDL-C >59 mg/dL is considered a                          negative risk factor for CHD.  Marland Kitchen VLDL Cholesterol Cal 03/17/2014 28  5 - 40 mg/dL Final  . LDL Calculated 03/17/2014 140* 0 - 99 mg/dL Final  . Chol/HDL Ratio 03/17/2014 4.4  0.0 - 4.4 ratio units Final   Comment:                                   T. Chol/HDL Ratio                                                                      Men  Women  1/2 Avg.Risk  3.4    3.3                                                            Avg.Risk  5.0    4.4                                                          2X Avg.Risk  9.6    7.1                                                         3X Avg.Risk 23.4   11.0  . Glucose 03/17/2014 116* 65 - 99 mg/dL Final  . BUN 03/17/2014 16  8 - 27 mg/dL Final  . Creatinine, Ser 03/17/2014 0.84  0.57 - 1.00 mg/dL Final  . GFR calc non Af Amer 03/17/2014 67  >59 mL/min/1.73 Final  . GFR calc Af Amer 03/17/2014 77  >59 mL/min/1.73 Final  . BUN/Creatinine Ratio 03/17/2014 19  11 - 26 Final  . Sodium 03/17/2014 142  134 - 144 mmol/L Final  . Potassium 03/17/2014 4.5  3.5 - 5.2 mmol/L Final  . Chloride 03/17/2014 102  97 - 108 mmol/L Final  . CO2 03/17/2014 22  18 - 29 mmol/L Final  . Calcium 03/17/2014 9.9  8.7 - 10.3 mg/dL Final  . Total Protein 03/17/2014 6.7  6.0 - 8.5 g/dL Final  . Albumin 03/17/2014 4.3  3.5 - 4.8 g/dL Final  . Globulin, Total 03/17/2014 2.4  1.5 - 4.5 g/dL Final  . Albumin/Globulin Ratio 03/17/2014 1.8  1.1 - 2.5 Final  . Total Bilirubin 03/17/2014 0.6  0.0 - 1.2 mg/dL Final  . Alkaline Phosphatase 03/17/2014 63  39 - 117 IU/L Final  . AST 03/17/2014 64* 0 - 40 IU/L Final  . ALT 03/17/2014 76* 0 - 32 IU/L Final  . Creatinine, Ur 03/17/2014 113.7  15.0 - 278.0 mg/dL Final  . Microalbum.,U,Random 03/17/2014 15.0  0.0 - 17.0 ug/mL Final  . MICROALB/CREAT RATIO 03/17/2014 13.2  0.0 - 30.0 mg/g creat Final     Assessment/Plan  1. Essential hypertension continue current meds  2. Pain in joint, lower leg, right continue current meds  3. Pain in joint, shoulder region, unspecified laterality - cyclobenzaprine (FLEXERIL) 10 MG tablet; One up to 3 times daily as needed to help muscle spasm and pain  Dispense: 50 tablet; Refill: 4 - HYDROcodone-acetaminophen (NORCO) 10-325 MG per tablet; Take one tablet by mouth up to four times a day as needed for pain.  Dispense: 120 tablet; Refill: 0  4. Type 2 diabetes mellitus without complication - Hemoglobin A1c; Future - Comprehensive metabolic panel; Future -  Microalbumin, urine; Future  5. Hyperlipidemia - Lipid panel; Future  6. Liver enzyme elevation - CMP, future

## 2014-06-12 ENCOUNTER — Other Ambulatory Visit: Payer: Self-pay | Admitting: *Deleted

## 2014-06-12 DIAGNOSIS — M25519 Pain in unspecified shoulder: Secondary | ICD-10-CM

## 2014-06-12 MED ORDER — HYDROCODONE-ACETAMINOPHEN 10-325 MG PO TABS: ORAL_TABLET | ORAL | Status: DC

## 2014-06-12 NOTE — Telephone Encounter (Signed)
Patient called and requested and will pick up 

## 2014-07-04 HISTORY — PX: CATARACT EXTRACTION, BILATERAL: SHX1313

## 2014-07-08 ENCOUNTER — Ambulatory Visit: Payer: Self-pay | Admitting: Internal Medicine

## 2014-07-22 ENCOUNTER — Other Ambulatory Visit: Payer: Self-pay | Admitting: *Deleted

## 2014-07-22 DIAGNOSIS — M25519 Pain in unspecified shoulder: Secondary | ICD-10-CM

## 2014-07-22 MED ORDER — HYDROCODONE-ACETAMINOPHEN 10-325 MG PO TABS: ORAL_TABLET | ORAL | Status: DC

## 2014-07-22 NOTE — Telephone Encounter (Signed)
Patient called and requested and will pick up

## 2014-09-05 ENCOUNTER — Other Ambulatory Visit: Payer: Medicare Other

## 2014-09-05 DIAGNOSIS — E119 Type 2 diabetes mellitus without complications: Secondary | ICD-10-CM

## 2014-09-05 DIAGNOSIS — E785 Hyperlipidemia, unspecified: Secondary | ICD-10-CM

## 2014-09-06 LAB — COMPREHENSIVE METABOLIC PANEL
ALT: 152 IU/L — ABNORMAL HIGH (ref 0–32)
AST: 156 IU/L — AB (ref 0–40)
Albumin/Globulin Ratio: 1.6 (ref 1.1–2.5)
Albumin: 4.1 g/dL (ref 3.5–4.8)
Alkaline Phosphatase: 71 IU/L (ref 39–117)
BILIRUBIN TOTAL: 1.3 mg/dL — AB (ref 0.0–1.2)
BUN/Creatinine Ratio: 18 (ref 11–26)
BUN: 16 mg/dL (ref 8–27)
CALCIUM: 10 mg/dL (ref 8.7–10.3)
CO2: 23 mmol/L (ref 18–29)
Chloride: 100 mmol/L (ref 97–108)
Creatinine, Ser: 0.89 mg/dL (ref 0.57–1.00)
GFR calc Af Amer: 71 mL/min/{1.73_m2} (ref >59)
GFR calc non Af Amer: 62 mL/min/{1.73_m2} (ref >59)
GLOBULIN, TOTAL: 2.6 g/dL (ref 1.5–4.5)
Glucose: 114 mg/dL — ABNORMAL HIGH (ref 65–99)
Potassium: 4 mmol/L (ref 3.5–5.2)
SODIUM: 140 mmol/L (ref 134–144)
Total Protein: 6.7 g/dL (ref 6.0–8.5)

## 2014-09-06 LAB — LIPID PANEL
Chol/HDL Ratio: 3.5 ratio units (ref 0.0–4.4)
Cholesterol, Total: 186 mg/dL (ref 100–199)
HDL: 53 mg/dL (ref >39)
LDL CALC: 106 mg/dL — AB (ref 0–99)
Triglycerides: 133 mg/dL (ref 0–149)
VLDL Cholesterol Cal: 27 mg/dL (ref 5–40)

## 2014-09-06 LAB — MICROALBUMIN, URINE: MICROALBUM., U, RANDOM: 23.3 ug/mL — AB (ref 0.0–17.0)

## 2014-09-06 LAB — HEMOGLOBIN A1C
Est. average glucose Bld gHb Est-mCnc: 131 mg/dL
HEMOGLOBIN A1C: 6.2 % — AB (ref 4.8–5.6)

## 2014-09-10 ENCOUNTER — Encounter: Payer: Self-pay | Admitting: Internal Medicine

## 2014-09-10 ENCOUNTER — Ambulatory Visit (INDEPENDENT_AMBULATORY_CARE_PROVIDER_SITE_OTHER): Payer: Medicare Other | Admitting: Internal Medicine

## 2014-09-10 VITALS — BP 140/84 | HR 64 | Temp 98.0°F | Ht 65.5 in | Wt 184.0 lb

## 2014-09-10 DIAGNOSIS — M25519 Pain in unspecified shoulder: Secondary | ICD-10-CM

## 2014-09-10 DIAGNOSIS — E785 Hyperlipidemia, unspecified: Secondary | ICD-10-CM

## 2014-09-10 DIAGNOSIS — E119 Type 2 diabetes mellitus without complications: Secondary | ICD-10-CM

## 2014-09-10 DIAGNOSIS — I10 Essential (primary) hypertension: Secondary | ICD-10-CM | POA: Diagnosis not present

## 2014-09-10 DIAGNOSIS — R748 Abnormal levels of other serum enzymes: Secondary | ICD-10-CM

## 2014-09-10 MED ORDER — HYDROCODONE-ACETAMINOPHEN 10-325 MG PO TABS: ORAL_TABLET | ORAL | Status: DC

## 2014-09-10 MED ORDER — AMLODIPINE BESYLATE 5 MG PO TABS: ORAL_TABLET | ORAL | Status: DC

## 2014-09-11 NOTE — Progress Notes (Signed)
Patient ID: Tamara Yang, female   DOB: 1933/06/27, 81 y.o.   MRN: [id number]    Facility  [facility]    Place of Service:   OFFICE   Allergies  Allergen Reactions  . Daypro [Oxaprozin]   . Erythromycin   . Talwin [Pentazocine]     Chief Complaint  Patient presents with  . Medical Management of Chronic Issues    4 Month Follow up. Pharmacy gave patient 10mg  of Norvasc instead of 5mg  and patient has been trying to cut them in half, not scored, because pharmacy would not.    HPI:  Pain in joint, shoulder region, unspecified laterality - unchanged. Benefits from HYDROcodone-acetaminophen (NORCO) 10-325 MG per tablet  Essential hypertension -using amLODipine (NORVASC) 5 MG tablet, HCTZ,  and enalapril  Liver enzyme elevation - latest lab shows a rise in her SGOT and SGPT. These enzymes have been elevated in the past most likely due to hepatic steatosis.  Type 2 diabetes mellitus without complication - controlled   Hyperlipidemia: Controlled    Medications: Patient's Medications  New Prescriptions   No medications on file  Previous Medications   ASPIRIN 81 MG TABLET    Take 81 mg by mouth daily.   CHOLECALCIFEROL (VITAMIN D) 1000 UNITS TABLET    Take two tablets once daily   CYCLOBENZAPRINE (FLEXERIL) 10 MG TABLET    One up to 3 times daily as needed to help muscle spasm and pain   ENALAPRIL (VASOTEC) 20 MG TABLET    Take one tablet by mouth twice daily To help control blood pressure.   HYDROCHLOROTHIAZIDE (HYDRODIURIL) 25 MG TABLET    Take one tablet daily to help swelling.   METOPROLOL SUCCINATE (TOPROL-XL) 50 MG 24 HR TABLET    Take one tablet twice a day for blood pressure.   OMEPRAZOLE (PRILOSEC) 20 MG CAPSULE    20 mg. Take one tablet at bedtime to reduce stomach acid and to help heartburn.  Modified Medications   Modified Medication Previous Medication   AMLODIPINE (NORVASC) 5 MG TABLET amLODipine (NORVASC) 5 MG tablet      One daily to control BP    One daily to control  BP   HYDROCODONE-ACETAMINOPHEN (NORCO) 10-325 MG PER TABLET HYDROcodone-acetaminophen (NORCO) 10-325 MG per tablet      Take one tablet by mouth up to four times a day as needed for pain.    Take one tablet by mouth up to four times a day as needed for pain.  Discontinued Medications   No medications on file     Review of Systems  Constitutional: Negative for fever, activity change, appetite change, fatigue and unexpected weight change.  HENT: Positive for hearing loss and tinnitus. Negative for sore throat.   Eyes:       Corrective lenses. Normal diabetic eye exam by Dr. [name] 07/13/2010.  Respiratory: Negative for cough.   Cardiovascular: Negative.   Gastrointestinal: Negative.   Endocrine:       History of diabetes  Genitourinary: Negative.   Musculoskeletal: Positive for neck pain and neck stiffness.       Diffuse joint pains. Increased pain in the right shoulder. Generalized arthritis is present. Has right knee pains. Gait stable.  Skin: Negative.   Allergic/Immunologic: Negative.   Neurological: Positive for headaches. Negative for dizziness, tremors, seizures, facial asymmetry, speech difficulty, weakness and numbness.  Hematological: Negative.   Psychiatric/Behavioral: Negative.     Filed Vitals:   09/10/14 1201  BP: 140/84  Pulse: 64  Temp: 98 F (36.7 C)  TempSrc: Oral  Height: 5' 5.5" (1.664 m)  Weight: 184 lb (83.462 kg)   Body mass index is 30.14 kg/(m^2).  Physical Exam  Constitutional: She is oriented to person, place, and time. She appears well-developed and well-nourished. No distress.  Mildly overweight.  HENT:  Bilateral partial deafness.  Eyes: Conjunctivae and EOM are normal. Pupils are equal, round, and reactive to light. Left eye exhibits no discharge. No scleral icterus.  Neck: Normal range of motion. Neck supple. No JVD present. No tracheal deviation present. No thyromegaly present.  Cardiovascular: Normal rate, regular rhythm and intact distal  pulses.  Exam reveals no gallop and no friction rub.   No murmur heard. Pulmonary/Chest: Effort normal. No respiratory distress. She has no wheezes. She has no rales.  Abdominal: Soft. Bowel sounds are normal. She exhibits no distension and no mass. There is no tenderness.  Musculoskeletal: She exhibits tenderness. She exhibits no edema.  Reduced ability to raise her arms. Right shoulder pain. Right knee pains. Right knee effusion.  Lymphadenopathy:    She has no cervical adenopathy.  Neurological: She is alert and oriented to person, place, and time. No cranial nerve deficit. Coordination normal.  6- CIT score was 0  Skin: Skin is warm and dry. No rash noted. No erythema. No pallor.  Psychiatric: She has a normal mood and affect. Her behavior is normal. Judgment and thought content normal.     Labs reviewed: Appointment on 09/05/2014  Component Date Value Ref Range Status  . Hgb A1c MFr Bld 09/05/2014 6.2* 4.8 - 5.6 % Final   Comment:          Pre-diabetes: 5.7 - 6.4          Diabetes: >6.4          Glycemic control for adults with diabetes: <7.0   . Est. average glucose Bld gHb Est-m* 09/05/2014 131   Final  . Glucose 09/05/2014 114* 65 - 99 mg/dL Final  . BUN 09/05/2014 16  8 - 27 mg/dL Final  . Creatinine, Ser 09/05/2014 0.89  0.57 - 1.00 mg/dL Final  . GFR calc non Af Amer 09/05/2014 62  >59 mL/min/1.73 Final  . GFR calc Af Amer 09/05/2014 71  >59 mL/min/1.73 Final  . BUN/Creatinine Ratio 09/05/2014 18  11 - 26 Final  . Sodium 09/05/2014 140  134 - 144 mmol/L Final  . Potassium 09/05/2014 4.0  3.5 - 5.2 mmol/L Final  . Chloride 09/05/2014 100  97 - 108 mmol/L Final  . CO2 09/05/2014 23  18 - 29 mmol/L Final  . Calcium 09/05/2014 10.0  8.7 - 10.3 mg/dL Final  . Total Protein 09/05/2014 6.7  6.0 - 8.5 g/dL Final  . Albumin 09/05/2014 4.1  3.5 - 4.8 g/dL Final  . Globulin, Total 09/05/2014 2.6  1.5 - 4.5 g/dL Final  . Albumin/Globulin Ratio 09/05/2014 1.6  1.1 - 2.5 Final  .  Bilirubin Total 09/05/2014 1.3* 0.0 - 1.2 mg/dL Final  . Alkaline Phosphatase 09/05/2014 71  39 - 117 IU/L Final  . AST 09/05/2014 156* 0 - 40 IU/L Final  . ALT 09/05/2014 152* 0 - 32 IU/L Final  . Cholesterol, Total 09/05/2014 186  100 - 199 mg/dL Final  . Triglycerides 09/05/2014 133  0 - 149 mg/dL Final  . HDL 09/05/2014 53  >39 mg/dL Final   Comment: According to ATP-III Guidelines, HDL-C >59 mg/dL is considered a negative risk factor for CHD.   Marland Kitchen VLDL  Cholesterol Cal 09/05/2014 27  5 - 40 mg/dL Final  . LDL Calculated 09/05/2014 106* 0 - 99 mg/dL Final  . Chol/HDL Ratio 09/05/2014 3.5  0.0 - 4.4 ratio units Final   Comment:                                   T. Chol/HDL Ratio                                             Men  Women                               1/2 Avg.Risk  3.4    3.3                                   Avg.Risk  5.0    4.4                                2X Avg.Risk  9.6    7.1                                3X Avg.Risk 23.4   11.0   . Microalbum.,U,Random 09/05/2014 23.3* 0.0 - 17.0 ug/mL Final     Assessment/Plan  1. Pain in joint, shoulder region, unspecified laterality Stable. Continue current pain medication - HYDROcodone-acetaminophen (NORCO) 10-325 MG per tablet; Take one tablet by mouth up to four times a day as needed for pain.  Dispense: 120 tablet; Refill: 0  2. Essential hypertension Continue current medications - Comprehensive metabolic panel; Future  3. Liver enzyme elevation I'm somewhat concerned about the rise in the liver enzymes. - Comprehensive metabolic panel; Future - US Abdomen Complete; Future  4. Type 2 diabetes mellitus without complication Controlled - Hemoglobin A1c; Future - Comprehensive metabolic panel; Future  5. Hyperlipidemia Controlled

## 2014-09-18 ENCOUNTER — Other Ambulatory Visit: Payer: Self-pay | Admitting: *Deleted

## 2014-09-18 MED ORDER — HYDROCHLOROTHIAZIDE 25 MG PO TABS: ORAL_TABLET | ORAL | Status: DC

## 2014-09-18 NOTE — Telephone Encounter (Signed)
Patient Requested to be faxed to pharmacy. 

## 2014-09-26 ENCOUNTER — Ambulatory Visit
Admission: RE | Admit: 2014-09-26 | Discharge: 2014-09-26 | Disposition: A | Discharge status: Home / Self Care | Payer: PRIVATE HEALTH INSURANCE | Source: Ambulatory Visit | Attending: Internal Medicine | Admitting: Internal Medicine

## 2014-09-26 DIAGNOSIS — R748 Abnormal levels of other serum enzymes: Secondary | ICD-10-CM

## 2014-10-11 ENCOUNTER — Other Ambulatory Visit: Payer: Self-pay | Admitting: Internal Medicine

## 2014-10-14 NOTE — Telephone Encounter (Signed)
Spoke with patient says  She is now taking 5 mg not 2.5

## 2014-10-14 NOTE — Telephone Encounter (Signed)
Called patient left message

## 2014-10-16 ENCOUNTER — Encounter: Payer: Self-pay | Admitting: Internal Medicine

## 2014-10-16 ENCOUNTER — Other Ambulatory Visit: Payer: Self-pay | Admitting: *Deleted

## 2014-10-16 DIAGNOSIS — M25519 Pain in unspecified shoulder: Secondary | ICD-10-CM

## 2014-10-16 MED ORDER — HYDROCODONE-ACETAMINOPHEN 10-325 MG PO TABS: ORAL_TABLET | ORAL | Status: DC

## 2014-10-16 NOTE — Telephone Encounter (Signed)
Patient Requested and will pick up 

## 2014-10-17 LAB — HM DIABETES EYE EXAM

## 2014-10-21 ENCOUNTER — Other Ambulatory Visit: Payer: Self-pay | Admitting: *Deleted

## 2014-10-21 DIAGNOSIS — I10 Essential (primary) hypertension: Secondary | ICD-10-CM

## 2014-10-21 MED ORDER — ENALAPRIL MALEATE 20 MG PO TABS: ORAL_TABLET | ORAL | Status: DC

## 2014-10-21 NOTE — Telephone Encounter (Signed)
Patient switched pharmacy and wanted a refill faxed to [facility]. Faxed.

## 2014-11-24 ENCOUNTER — Other Ambulatory Visit: Payer: Self-pay | Admitting: *Deleted

## 2014-11-24 DIAGNOSIS — M25519 Pain in unspecified shoulder: Secondary | ICD-10-CM

## 2014-11-24 MED ORDER — HYDROCODONE-ACETAMINOPHEN 10-325 MG PO TABS: ORAL_TABLET | ORAL | Status: DC

## 2014-11-24 NOTE — Telephone Encounter (Signed)
Patient Requested and will pick up 

## 2014-11-26 ENCOUNTER — Encounter: Payer: Self-pay | Admitting: *Deleted

## 2015-01-02 ENCOUNTER — Other Ambulatory Visit: Payer: Medicare Other

## 2015-01-06 ENCOUNTER — Other Ambulatory Visit: Payer: Medicare Other

## 2015-01-06 DIAGNOSIS — E119 Type 2 diabetes mellitus without complications: Secondary | ICD-10-CM

## 2015-01-06 DIAGNOSIS — I10 Essential (primary) hypertension: Secondary | ICD-10-CM

## 2015-01-06 DIAGNOSIS — R748 Abnormal levels of other serum enzymes: Secondary | ICD-10-CM

## 2015-01-07 ENCOUNTER — Encounter: Payer: Self-pay | Admitting: Internal Medicine

## 2015-01-07 ENCOUNTER — Ambulatory Visit (INDEPENDENT_AMBULATORY_CARE_PROVIDER_SITE_OTHER): Payer: Medicare Other | Admitting: Internal Medicine

## 2015-01-07 VITALS — BP 158/78 | HR 66 | Temp 98.2°F | Resp 20 | Ht 66.0 in | Wt 182.6 lb

## 2015-01-07 DIAGNOSIS — M25519 Pain in unspecified shoulder: Secondary | ICD-10-CM | POA: Diagnosis not present

## 2015-01-07 DIAGNOSIS — E119 Type 2 diabetes mellitus without complications: Secondary | ICD-10-CM | POA: Diagnosis not present

## 2015-01-07 DIAGNOSIS — K7581 Nonalcoholic steatohepatitis (NASH): Secondary | ICD-10-CM | POA: Diagnosis not present

## 2015-01-07 DIAGNOSIS — E785 Hyperlipidemia, unspecified: Secondary | ICD-10-CM

## 2015-01-07 DIAGNOSIS — M25561 Pain in right knee: Secondary | ICD-10-CM

## 2015-01-07 DIAGNOSIS — R748 Abnormal levels of other serum enzymes: Secondary | ICD-10-CM

## 2015-01-07 DIAGNOSIS — I10 Essential (primary) hypertension: Secondary | ICD-10-CM

## 2015-01-07 LAB — COMPREHENSIVE METABOLIC PANEL
ALK PHOS: 54 IU/L (ref 39–117)
ALT: 41 IU/L — ABNORMAL HIGH (ref 0–32)
AST: 29 IU/L (ref 0–40)
Albumin/Globulin Ratio: 1.7 (ref 1.1–2.5)
Albumin: 4 g/dL (ref 3.5–4.8)
BUN/Creatinine Ratio: 17 (ref 11–26)
BUN: 14 mg/dL (ref 8–27)
Bilirubin Total: 0.9 mg/dL (ref 0.0–1.2)
CHLORIDE: 102 mmol/L (ref 97–108)
CO2: 24 mmol/L (ref 18–29)
Calcium: 9.7 mg/dL (ref 8.7–10.3)
Creatinine, Ser: 0.82 mg/dL (ref 0.57–1.00)
GFR calc Af Amer: 79 mL/min/{1.73_m2} (ref >59)
GFR calc non Af Amer: 68 mL/min/{1.73_m2} (ref >59)
GLUCOSE: 107 mg/dL — AB (ref 65–99)
Globulin, Total: 2.4 g/dL (ref 1.5–4.5)
POTASSIUM: 4 mmol/L (ref 3.5–5.2)
SODIUM: 142 mmol/L (ref 134–144)
Total Protein: 6.4 g/dL (ref 6.0–8.5)

## 2015-01-07 LAB — HEMOGLOBIN A1C
Est. average glucose Bld gHb Est-mCnc: 126 mg/dL
Hgb A1c MFr Bld: 6 % — ABNORMAL HIGH (ref 4.8–5.6)

## 2015-01-07 MED ORDER — CYCLOBENZAPRINE HCL 10 MG PO TABS: ORAL_TABLET | ORAL | Status: DC

## 2015-01-07 MED ORDER — HYDROCHLOROTHIAZIDE 25 MG PO TABS: ORAL_TABLET | ORAL | Status: DC

## 2015-01-07 MED ORDER — HYDROCODONE-ACETAMINOPHEN 10-325 MG PO TABS: ORAL_TABLET | ORAL | Status: DC

## 2015-01-07 MED ORDER — HYDROCODONE-ACETAMINOPHEN 10-325 MG PO TABS
ORAL_TABLET | ORAL | Status: DC
Start: 2015-01-07 — End: 2015-02-17

## 2015-01-07 NOTE — Progress Notes (Signed)
Patient ID: Tamara Yang, female   DOB: Oct 11, 1932, 81 y.o.   MRN: [id number]    Facility  [facility]    Place of Service:   OFFICE    Allergies  Allergen Reactions  . Daypro [Oxaprozin]   . Erythromycin   . Talwin [Pentazocine]     Chief Complaint  Patient presents with  . Follow-up    HPI:  Pain in joint, shoulder region, unspecified laterality: Ongoing, controlled with Norco  Pain in joint, lower leg, right: Chronic osteoarthritis, Orthopedist following. Putting off replacement for now, while can "deal with the pain"  Liver enzyme elevation: Abdominal US on 09/26/14 showing mild hepatic steatosis. Specifically, no evidence of intra orextrahepatic biliary duct dilatation. No discrete worrisome hepatic lesions. Incidentally noted hepatic cysts as detailed above. Denies abdominal pain or discomfort. LFTs markedly improved since last visit. Grandson living with her and doing most of cooking, which has improved her diet, decreased fatty/greasy foods.  Essential hypertension: Elevated today at 158/78. Remained elevated on recheck. May be due to stress, has had car trouble this am.   Type 2 diabetes mellitus without complication: Z6W stable at 6.0 with diet control.  Hyperlipidemia: needs follow up   Medications: Patient's Medications  New Prescriptions   No medications on file  Previous Medications   AMLODIPINE (NORVASC) 5 MG TABLET    One daily to control BP   ASPIRIN 81 MG TABLET    Take 81 mg by mouth daily.   CHOLECALCIFEROL (VITAMIN D) 1000 UNITS TABLET    Take two tablets once daily   CYCLOBENZAPRINE (FLEXERIL) 10 MG TABLET    One up to 3 times daily as needed to help muscle spasm and pain   ENALAPRIL (VASOTEC) 20 MG TABLET    Take one tablet by mouth twice daily To help control blood pressure.   HYDROCHLOROTHIAZIDE (HYDRODIURIL) 25 MG TABLET    Take one tablet daily to help swelling.   HYDROCODONE-ACETAMINOPHEN (NORCO) 10-325 MG PER TABLET    Take one tablet by mouth up to  four times a day as needed for pain.   METOPROLOL SUCCINATE (TOPROL-XL) 50 MG 24 HR TABLET    Take one tablet twice a day for blood pressure.   OMEPRAZOLE (PRILOSEC) 20 MG CAPSULE    20 mg. Take one tablet at bedtime to reduce stomach acid and to help heartburn.  Modified Medications   No medications on file  Discontinued Medications   No medications on file     Review of Systems  Constitutional: Negative for fever, activity change, appetite change, fatigue and unexpected weight change.  HENT: Positive for hearing loss and tinnitus. Negative for sore throat.   Eyes:       Corrective lenses. Normal diabetic eye exam by Dr. [name] 07/13/2010.  Respiratory: Negative for cough.   Cardiovascular: Negative.   Gastrointestinal: Negative.   Endocrine:       History of diabetes  Genitourinary: Negative.   Musculoskeletal: Positive for arthralgias, neck pain and neck stiffness.       Diffuse joint pains. Bilateral shoulder pain R>L. Generalized arthritis is present. Has right knee pains. Gait stable.  Skin: Negative.   Allergic/Immunologic: Negative.   Neurological: Negative for dizziness, tremors, seizures, facial asymmetry, speech difficulty, weakness and numbness.  Hematological: Negative.   Psychiatric/Behavioral: Negative.     Filed Vitals:   01/07/15 1159  BP: 158/78  Pulse: 66  Temp: 98.2 F (36.8 C)  TempSrc: Oral  Resp: 20  Height: 5\' 6"  (1.676 m)  Weight: 182 lb 9.6 oz (82.827 kg)  SpO2: 96%   Body mass index is 29.49 kg/(m^2).  Physical Exam  Constitutional: She is oriented to person, place, and time. She appears well-developed and well-nourished. No distress.  Mildly overweight.  HENT:  Bilateral partial deafness.  Eyes: Conjunctivae and EOM are normal. Pupils are equal, round, and reactive to light. Left eye exhibits no discharge. No scleral icterus.  Neck: Normal range of motion. Neck supple. No JVD present. No tracheal deviation present. No thyromegaly present.    Cardiovascular: Normal rate, regular rhythm and intact distal pulses.  Exam reveals no gallop and no friction rub.   No murmur heard. Pulmonary/Chest: Effort normal. No respiratory distress. She has no wheezes. She has no rales.  Abdominal: Soft. Bowel sounds are normal. She exhibits no distension and no mass. There is no tenderness.  Musculoskeletal: She exhibits tenderness. She exhibits no edema.  Reduced ability to raise her arms. Bilateral shoulder pain. Right knee pains. Right knee effusion.  Lymphadenopathy:    She has no cervical adenopathy.  Neurological: She is alert and oriented to person, place, and time. No cranial nerve deficit. Coordination normal.  6- CIT score was 0  Skin: Skin is warm and dry. No rash noted. No erythema. No pallor.  Psychiatric: She has a normal mood and affect. Her behavior is normal. Judgment and thought content normal.     Labs reviewed: Appointment on 01/06/2015  Component Date Value Ref Range Status  . Hgb A1c MFr Bld 01/06/2015 6.0* 4.8 - 5.6 % Final   Comment:          Pre-diabetes: 5.7 - 6.4          Diabetes: >6.4          Glycemic control for adults with diabetes: <7.0   . Est. average glucose Bld gHb Est-m* 01/06/2015 126   Final  . Glucose 01/06/2015 107* 65 - 99 mg/dL Final  . BUN 01/06/2015 14  8 - 27 mg/dL Final  . Creatinine, Ser 01/06/2015 0.82  0.57 - 1.00 mg/dL Final  . GFR calc non Af Amer 01/06/2015 68  >59 mL/min/1.73 Final  . GFR calc Af Amer 01/06/2015 79  >59 mL/min/1.73 Final  . BUN/Creatinine Ratio 01/06/2015 17  11 - 26 Final  . Sodium 01/06/2015 142  134 - 144 mmol/L Final  . Potassium 01/06/2015 4.0  3.5 - 5.2 mmol/L Final  . Chloride 01/06/2015 102  97 - 108 mmol/L Final  . CO2 01/06/2015 24  18 - 29 mmol/L Final  . Calcium 01/06/2015 9.7  8.7 - 10.3 mg/dL Final  . Total Protein 01/06/2015 6.4  6.0 - 8.5 g/dL Final  . Albumin 01/06/2015 4.0  3.5 - 4.8 g/dL Final  . Globulin, Total 01/06/2015 2.4  1.5 - 4.5 g/dL  Final  . Albumin/Globulin Ratio 01/06/2015 1.7  1.1 - 2.5 Final  . Bilirubin Total 01/06/2015 0.9  0.0 - 1.2 mg/dL Final  . Alkaline Phosphatase 01/06/2015 54  39 - 117 IU/L Final  . AST 01/06/2015 29  0 - 40 IU/L Final  . ALT 01/06/2015 41* 0 - 32 IU/L Final  Abstract on 11/26/2014  Component Date Value Ref Range Status  . HM Diabetic Eye Exam 10/17/2014 No Retinopathy  No Retinopathy Final   [facility]     Assessment/Plan 1. Pain in joint, shoulder region, unspecified laterality - cyclobenzaprine (FLEXERIL) 10 MG tablet; One up to 3 times daily as needed to help muscle spasm and pain  Dispense: 50 tablet; Refill: 4 - HYDROcodone-acetaminophen (NORCO) 10-325 MG per tablet; Take one tablet by mouth up to four times a day as needed for pain.  Dispense: 120 tablet; Refill: 0 - HYDROcodone-acetaminophen (NORCO) 10-325 MG per tablet; Take one tablet by mouth up to four times a day as needed for pain.  Dispense: 120 tablet; Refill: 0  2. Pain in joint, lower leg, right Defer knee replacement at present  3. Liver enzyme elevation Improving, Encouraged to continue low fat diet - Comprehensive metabolic panel; Future  4. Essential hypertension - hydrochlorothiazide (HYDRODIURIL) 25 MG tablet; Take one tablet daily to help swelling.  Dispense: 30 tablet; Refill: 5 - Comprehensive metabolic panel; Future  5. Type 2 diabetes mellitus without complication Controlled - Comprehensive metabolic panel; Future - Hemoglobin A1c; Future  6. Hyperlipidemia - Lipid panel; Future  7. Nonalcoholic steatohepatitis (NASH) Encouraged to continue low fat diet

## 2015-01-21 ENCOUNTER — Other Ambulatory Visit: Payer: Self-pay | Admitting: *Deleted

## 2015-01-21 DIAGNOSIS — I10 Essential (primary) hypertension: Secondary | ICD-10-CM

## 2015-01-21 DIAGNOSIS — M25519 Pain in unspecified shoulder: Secondary | ICD-10-CM

## 2015-01-21 MED ORDER — CYCLOBENZAPRINE HCL 10 MG PO TABS: ORAL_TABLET | ORAL | Status: DC

## 2015-01-21 MED ORDER — METOPROLOL SUCCINATE ER 50 MG PO TB24: ORAL_TABLET | ORAL | Status: DC

## 2015-01-21 NOTE — Telephone Encounter (Signed)
Patient requested and faxed to pharmacy 

## 2015-01-22 ENCOUNTER — Telehealth: Payer: Self-pay

## 2015-01-22 NOTE — Telephone Encounter (Signed)
Fax received from [facility] on [address] indicating PA is required for Cyclobenzaprine ID # [id number]  I called initiate PA [phone]). Medication approved from 01/21/2015- 07/04/2015 (end of benefit year).   I called the pharmacy and left message for them to resubmit claim

## 2015-02-17 ENCOUNTER — Other Ambulatory Visit: Payer: Self-pay | Admitting: *Deleted

## 2015-02-17 DIAGNOSIS — M25519 Pain in unspecified shoulder: Secondary | ICD-10-CM

## 2015-02-17 MED ORDER — HYDROCODONE-ACETAMINOPHEN 10-325 MG PO TABS: ORAL_TABLET | ORAL | Status: DC

## 2015-02-17 NOTE — Telephone Encounter (Signed)
Patient requested and will pick up 

## 2015-03-23 ENCOUNTER — Other Ambulatory Visit: Payer: Self-pay | Admitting: *Deleted

## 2015-03-23 DIAGNOSIS — M25519 Pain in unspecified shoulder: Secondary | ICD-10-CM

## 2015-03-23 MED ORDER — HYDROCODONE-ACETAMINOPHEN 10-325 MG PO TABS: ORAL_TABLET | ORAL | Status: DC

## 2015-03-23 NOTE — Telephone Encounter (Signed)
Patient Requested and will pick up 

## 2015-04-06 ENCOUNTER — Telehealth: Payer: Self-pay | Admitting: *Deleted

## 2015-04-06 MED ORDER — LORAZEPAM 1 MG PO TABS: ORAL_TABLET | ORAL | Status: DC

## 2015-04-06 NOTE — Telephone Encounter (Signed)
Printed Rx and faxed to pharmacy. Patient notified.

## 2015-04-06 NOTE — Telephone Encounter (Signed)
It is okay to call in a prescription for lorazepam 1 mg (dispense 30 Sig: One tablet every 4 hours if needed for anxiety.

## 2015-04-06 NOTE — Telephone Encounter (Signed)
Patient called and request Ativan for her nerves. Stated that her Brother in Law died and her sister is bad off. Please Advise.

## 2015-04-06 NOTE — Addendum Note (Signed)
Addended by: Rafael Bihari A on: 04/06/2015 12:44 PM   Modules accepted: Orders

## 2015-04-24 ENCOUNTER — Other Ambulatory Visit: Payer: Self-pay | Admitting: *Deleted

## 2015-04-24 DIAGNOSIS — M25519 Pain in unspecified shoulder: Secondary | ICD-10-CM

## 2015-04-24 MED ORDER — HYDROCODONE-ACETAMINOPHEN 10-325 MG PO TABS: ORAL_TABLET | ORAL | Status: DC

## 2015-04-24 NOTE — Telephone Encounter (Signed)
Patient requested and will pick up. Narcotic contract printed. 

## 2015-05-11 ENCOUNTER — Ambulatory Visit: Payer: Medicare Other

## 2015-05-15 ENCOUNTER — Other Ambulatory Visit: Payer: Self-pay | Admitting: Internal Medicine

## 2015-06-02 ENCOUNTER — Other Ambulatory Visit: Payer: Self-pay | Admitting: *Deleted

## 2015-06-02 DIAGNOSIS — M25519 Pain in unspecified shoulder: Secondary | ICD-10-CM

## 2015-06-02 MED ORDER — HYDROCODONE-ACETAMINOPHEN 10-325 MG PO TABS: ORAL_TABLET | ORAL | Status: DC

## 2015-06-02 NOTE — Telephone Encounter (Signed)
Patient requested and will pick up 

## 2015-06-04 ENCOUNTER — Encounter: Payer: Self-pay | Admitting: Internal Medicine

## 2015-07-08 ENCOUNTER — Other Ambulatory Visit: Payer: Self-pay | Admitting: *Deleted

## 2015-07-08 DIAGNOSIS — M25519 Pain in unspecified shoulder: Secondary | ICD-10-CM

## 2015-07-08 MED ORDER — HYDROCODONE-ACETAMINOPHEN 10-325 MG PO TABS: ORAL_TABLET | ORAL | Status: DC

## 2015-07-08 NOTE — Telephone Encounter (Signed)
Patient requested and will pick up 

## 2015-07-13 ENCOUNTER — Other Ambulatory Visit: Payer: Medicare Other

## 2015-07-15 ENCOUNTER — Ambulatory Visit: Payer: Medicare Other | Admitting: Internal Medicine

## 2015-07-27 ENCOUNTER — Other Ambulatory Visit: Payer: Medicare Other

## 2015-07-27 DIAGNOSIS — I10 Essential (primary) hypertension: Secondary | ICD-10-CM

## 2015-07-27 DIAGNOSIS — E785 Hyperlipidemia, unspecified: Secondary | ICD-10-CM

## 2015-07-27 DIAGNOSIS — R748 Abnormal levels of other serum enzymes: Secondary | ICD-10-CM

## 2015-07-27 DIAGNOSIS — E119 Type 2 diabetes mellitus without complications: Secondary | ICD-10-CM

## 2015-07-28 LAB — COMPREHENSIVE METABOLIC PANEL
A/G RATIO: 1.7 (ref 1.1–2.5)
ALT: 29 IU/L (ref 0–32)
AST: 29 IU/L (ref 0–40)
Albumin: 4.1 g/dL (ref 3.5–4.7)
Alkaline Phosphatase: 50 IU/L (ref 39–117)
BUN/Creatinine Ratio: 21 (ref 11–26)
BUN: 18 mg/dL (ref 8–27)
Bilirubin Total: 0.7 mg/dL (ref 0.0–1.2)
CALCIUM: 9.6 mg/dL (ref 8.7–10.3)
CO2: 26 mmol/L (ref 18–29)
Chloride: 103 mmol/L (ref 96–106)
Creatinine, Ser: 0.87 mg/dL (ref 0.57–1.00)
GFR, EST AFRICAN AMERICAN: 73 mL/min/{1.73_m2} (ref >59)
GFR, EST NON AFRICAN AMERICAN: 63 mL/min/{1.73_m2} (ref >59)
GLUCOSE: 100 mg/dL — AB (ref 65–99)
Globulin, Total: 2.4 g/dL (ref 1.5–4.5)
Potassium: 3.9 mmol/L (ref 3.5–5.2)
Sodium: 143 mmol/L (ref 134–144)
TOTAL PROTEIN: 6.5 g/dL (ref 6.0–8.5)

## 2015-07-28 LAB — LIPID PANEL
CHOLESTEROL TOTAL: 196 mg/dL (ref 100–199)
Chol/HDL Ratio: 4.4 ratio units (ref 0.0–4.4)
HDL: 45 mg/dL (ref >39)
LDL Calculated: 119 mg/dL — ABNORMAL HIGH (ref 0–99)
TRIGLYCERIDES: 158 mg/dL — AB (ref 0–149)
VLDL Cholesterol Cal: 32 mg/dL (ref 5–40)

## 2015-07-28 LAB — HEMOGLOBIN A1C
Est. average glucose Bld gHb Est-mCnc: 128 mg/dL
Hgb A1c MFr Bld: 6.1 % — ABNORMAL HIGH (ref 4.8–5.6)

## 2015-07-29 ENCOUNTER — Ambulatory Visit (INDEPENDENT_AMBULATORY_CARE_PROVIDER_SITE_OTHER): Payer: Medicare Other | Admitting: Internal Medicine

## 2015-07-29 ENCOUNTER — Encounter: Payer: Self-pay | Admitting: Internal Medicine

## 2015-07-29 VITALS — BP 156/74 | HR 67 | Temp 97.9°F | Ht 66.0 in | Wt 171.0 lb

## 2015-07-29 DIAGNOSIS — E119 Type 2 diabetes mellitus without complications: Secondary | ICD-10-CM

## 2015-07-29 DIAGNOSIS — E785 Hyperlipidemia, unspecified: Secondary | ICD-10-CM | POA: Diagnosis not present

## 2015-07-29 DIAGNOSIS — Z23 Encounter for immunization: Secondary | ICD-10-CM | POA: Diagnosis not present

## 2015-07-29 DIAGNOSIS — I10 Essential (primary) hypertension: Secondary | ICD-10-CM

## 2015-07-29 DIAGNOSIS — R748 Abnormal levels of other serum enzymes: Secondary | ICD-10-CM | POA: Diagnosis not present

## 2015-07-29 DIAGNOSIS — M25561 Pain in right knee: Secondary | ICD-10-CM | POA: Diagnosis not present

## 2015-07-29 DIAGNOSIS — M25519 Pain in unspecified shoulder: Secondary | ICD-10-CM

## 2015-07-29 MED ORDER — LORAZEPAM 1 MG PO TABS: ORAL_TABLET | ORAL | Status: DC

## 2015-07-29 NOTE — Progress Notes (Signed)
Patient ID: Tamara Yang, female   DOB: Nov 25, 1932, 82 y.o.   MRN: [id number]    Facility  [facility]    Place of Service:   OFFICE    Allergies  Allergen Reactions  . Daypro [Oxaprozin]   . Erythromycin   . Talwin [Pentazocine]     Chief Complaint  Patient presents with  . Medical Management of Chronic Issues    6 month follow-up, discuss labs (copy printed). DM foot exam due   . Medication Refill    Renew Ativan   . Immunizations    Flu vacccine today    History  Essential hypertension -  controlled -   Type 2 diabetes mellitus without complication, without long-term current use of insulin (HCC) - con trolled  Hyperlipidemia - adequately controlled  Liver enzyme elevation - resolved  -    Arthralgia of shoulder, unspecified laterality - improved   Arthralgia of right lower leg - Right knee pain doing somewhat better with Biofreeze  -     Medications: Patient's Medications  New Prescriptions   No medications on file  Previous Medications   AMLODIPINE (NORVASC) 5 MG TABLET    One daily to control BP   ASPIRIN 81 MG TABLET    Take 81 mg by mouth daily.   CHOLECALCIFEROL (VITAMIN D) 1000 UNITS TABLET    Take two tablets once daily   CYCLOBENZAPRINE (FLEXERIL) 10 MG TABLET    One up to 3 times daily as needed to help muscle spasm and pain   ENALAPRIL (VASOTEC) 20 MG TABLET    Take one tablet by mouth twice daily To help control blood pressure.   HYDROCHLOROTHIAZIDE (HYDRODIURIL) 25 MG TABLET    TAKE 1 TABLET BY MOUTH EVERY DAY TO HELP SWELLING   HYDROCODONE-ACETAMINOPHEN (NORCO) 10-325 MG TABLET    Take one tablet by mouth up to four times a day as needed for pain.   METOPROLOL SUCCINATE (TOPROL-XL) 50 MG 24 HR TABLET    Take one tablet twice a day for blood pressure.   OMEPRAZOLE (PRILOSEC) 20 MG CAPSULE    20 mg. Take one tablet at bedtime to reduce stomach acid and to help heartburn.  Modified Medications   Modified Medication Previous Medication   LORAZEPAM  (ATIVAN) 1 MG TABLET LORazepam (ATIVAN) 1 MG tablet      Take one tablet by mouth every 4 hours as needed for anxiety    Take one tablet by mouth every 4 hours as needed for anxiety  Discontinued Medications   No medications on file    Review of Systems  Constitutional: Negative for fever, activity change, appetite change, fatigue and unexpected weight change.  HENT: Positive for hearing loss and tinnitus. Negative for sore throat.   Eyes:       Corrective lenses. Normal diabetic eye exam by Dr. [name] 07/13/2010.  Respiratory: Negative for cough.   Cardiovascular: Negative.   Gastrointestinal: Negative.   Endocrine:       History of diabetes  Genitourinary: Negative.   Musculoskeletal: Positive for arthralgias, neck pain and neck stiffness.       Diffuse joint pains. Bilateral shoulder pain R>L. Generalized arthritis is present. Has right knee pains. Gait stable.  Skin: Negative.   Allergic/Immunologic: Negative.   Neurological: Negative for dizziness, tremors, seizures, facial asymmetry, speech difficulty, weakness and numbness.  Hematological: Negative.   Psychiatric/Behavioral: Negative.     Filed Vitals:   07/29/15 1137  BP: 156/74  Pulse: 67  Temp: 97.9 F (  36.6 C)  TempSrc: Oral  Height: '5\' 6"'$  (1.676 m)  Weight: 171 lb (77.565 kg)  SpO2: 96%   Body mass index is 27.61 kg/(m^2). Filed Weights   07/29/15 1137  Weight: 171 lb (77.565 kg)     Physical Exam  Constitutional: She is oriented to person, place, and time. She appears well-developed and well-nourished. No distress.  Mildly overweight.  HENT:  Bilateral partial deafness.  Eyes: Conjunctivae and EOM are normal. Pupils are equal, round, and reactive to light. Left eye exhibits no discharge. No scleral icterus.  Neck: Normal range of motion. Neck supple. No JVD present. No tracheal deviation present. No thyromegaly present.  Cardiovascular: Normal rate, regular rhythm and intact distal pulses.  Exam reveals  no gallop and no friction rub.   No murmur heard. Pulmonary/Chest: Effort normal. No respiratory distress. She has no wheezes. She has no rales.  Abdominal: Soft. Bowel sounds are normal. She exhibits no distension and no mass. There is no tenderness.  Musculoskeletal: She exhibits tenderness. She exhibits no edema.  Reduced ability to raise her arms. Bilateral shoulder pain. Right knee pains. Right knee effusion.  Lymphadenopathy:    She has no cervical adenopathy.  Neurological: She is alert and oriented to person, place, and time. No cranial nerve deficit. Coordination normal.  6- CIT score was 0  Skin: Skin is warm and dry. No rash noted. No erythema. No pallor.  Psychiatric: She has a normal mood and affect. Her behavior is normal. Judgment and thought content normal.    Labs reviewed: Lab Summary Latest Ref Rng 07/27/2015 01/06/2015 09/05/2014  Hemoglobin - (None) (None) (None)  Hematocrit - (None) (None) (None)  White count - (None) (None) (None)  Platelet count - (None) (None) (None)  Sodium 134 - 144 mmol/L 143 142 140  Potassium 3.5 - 5.2 mmol/L 3.9 4.0 4.0  Calcium 8.7 - 10.3 mg/dL 9.6 9.7 10.0  Phosphorus - (None) (None) (None)  Creatinine 0.57 - 1.00 mg/dL 0.87 0.82 0.89  AST 0 - 40 IU/L 29 29 156(H)  Alk Phos 39 - 117 IU/L 50 54 71  Bilirubin 0.0 - 1.2 mg/dL 0.7 0.9 1.3(H)  Glucose 65 - 99 mg/dL 100(H) 107(H) 114(H)  Cholesterol - (None) (None) (None)  HDL cholesterol >39 mg/dL 45 (None) 53  Triglycerides 0 - 149 mg/dL 158(H) (None) 133  LDL Direct - (None) (None) (None)  LDL Calc 0 - 99 mg/dL 119(H) (None) 106(H)  Total protein - (None) (None) (None)  Albumin 3.5 - 4.7 g/dL 4.1 4.0 4.1   No results found for: TSH, T3TOTAL, T4TOTAL, THYROIDAB Lab Results  Component Value Date   BUN 18 07/27/2015   BUN 14 01/06/2015   BUN 16 09/05/2014   Lab Results  Component Value Date   HGBA1C 6.1* 07/27/2015   HGBA1C 6.0* 01/06/2015   HGBA1C 6.2* 09/05/2014     Assessment/Plan 1. Essential hypertension - Basic metabolic panel; Future - EKG 12-Lead; Future  2. Type 2 diabetes mellitus without complication, without long-term current use of insulin (HCC) - Hemoglobin A1c; Future - Basic metabolic panel; Future - Microalbumin, urine; Future  3. Hyperlipidemia - Lipid panel; Future  4. Liver enzyme elevation resolved  5. Arthralgia of shoulder, unspecified laterality improved  6. Arthralgia of right lower leg continue Biofreeze

## 2015-08-19 ENCOUNTER — Other Ambulatory Visit: Payer: Self-pay | Admitting: *Deleted

## 2015-08-19 DIAGNOSIS — M25519 Pain in unspecified shoulder: Secondary | ICD-10-CM

## 2015-08-19 MED ORDER — HYDROCODONE-ACETAMINOPHEN 10-325 MG PO TABS: ORAL_TABLET | ORAL | Status: DC

## 2015-08-19 NOTE — Telephone Encounter (Signed)
Patient requested and will pick up 

## 2015-09-29 ENCOUNTER — Other Ambulatory Visit: Payer: Self-pay | Admitting: *Deleted

## 2015-09-29 DIAGNOSIS — M25519 Pain in unspecified shoulder: Secondary | ICD-10-CM

## 2015-09-29 MED ORDER — HYDROCODONE-ACETAMINOPHEN 10-325 MG PO TABS: ORAL_TABLET | ORAL | Status: DC

## 2015-09-29 NOTE — Telephone Encounter (Signed)
Patient requested and will pick up 

## 2015-10-09 ENCOUNTER — Other Ambulatory Visit: Payer: Self-pay | Admitting: Internal Medicine

## 2015-11-03 ENCOUNTER — Other Ambulatory Visit: Payer: Self-pay | Admitting: *Deleted

## 2015-11-03 DIAGNOSIS — M25519 Pain in unspecified shoulder: Secondary | ICD-10-CM

## 2015-11-03 MED ORDER — HYDROCODONE-ACETAMINOPHEN 10-325 MG PO TABS: ORAL_TABLET | ORAL | Status: DC

## 2015-11-13 ENCOUNTER — Other Ambulatory Visit: Payer: Self-pay | Admitting: Nurse Practitioner

## 2015-11-23 ENCOUNTER — Other Ambulatory Visit: Payer: Self-pay | Admitting: *Deleted

## 2015-11-23 MED ORDER — HYDROCHLOROTHIAZIDE 25 MG PO TABS: ORAL_TABLET | ORAL | Status: DC

## 2015-11-23 NOTE — Telephone Encounter (Signed)
[facility] 

## 2015-12-08 ENCOUNTER — Other Ambulatory Visit: Payer: Self-pay

## 2015-12-08 DIAGNOSIS — M25519 Pain in unspecified shoulder: Secondary | ICD-10-CM

## 2015-12-08 MED ORDER — HYDROCODONE-ACETAMINOPHEN 10-325 MG PO TABS
ORAL_TABLET | ORAL | Status: DC
Start: 2015-12-08 — End: 2016-01-12

## 2015-12-08 NOTE — Telephone Encounter (Signed)
Rx printed and placed on ledge for signature

## 2015-12-10 ENCOUNTER — Telehealth: Payer: Self-pay

## 2015-12-10 NOTE — Telephone Encounter (Signed)
Patient called to see if her prescription was ready for pick up

## 2016-01-12 ENCOUNTER — Other Ambulatory Visit: Payer: Self-pay | Admitting: *Deleted

## 2016-01-12 DIAGNOSIS — M25519 Pain in unspecified shoulder: Secondary | ICD-10-CM

## 2016-01-12 DIAGNOSIS — E785 Hyperlipidemia, unspecified: Secondary | ICD-10-CM

## 2016-01-12 DIAGNOSIS — I10 Essential (primary) hypertension: Secondary | ICD-10-CM

## 2016-01-12 DIAGNOSIS — E119 Type 2 diabetes mellitus without complications: Secondary | ICD-10-CM

## 2016-01-12 MED ORDER — HYDROCODONE-ACETAMINOPHEN 10-325 MG PO TABS: ORAL_TABLET | ORAL | Status: DC

## 2016-01-12 NOTE — Telephone Encounter (Signed)
Patient requested and will pick up 

## 2016-01-18 ENCOUNTER — Other Ambulatory Visit: Payer: Medicare Other

## 2016-01-18 DIAGNOSIS — E785 Hyperlipidemia, unspecified: Secondary | ICD-10-CM

## 2016-01-18 DIAGNOSIS — I10 Essential (primary) hypertension: Secondary | ICD-10-CM

## 2016-01-18 DIAGNOSIS — E119 Type 2 diabetes mellitus without complications: Secondary | ICD-10-CM

## 2016-01-18 LAB — BASIC METABOLIC PANEL WITH GFR
BUN: 18 mg/dL (ref 7–25)
CHLORIDE: 104 mmol/L (ref 98–110)
CO2: 24 mmol/L (ref 20–31)
Calcium: 9.5 mg/dL (ref 8.6–10.4)
Creat: 1.02 mg/dL — ABNORMAL HIGH (ref 0.60–0.88)
GFR, EST NON AFRICAN AMERICAN: 52 mL/min — AB (ref >=60)
GFR, Est African American: 60 mL/min (ref >=60)
GLUCOSE: 107 mg/dL — AB (ref 65–99)
Potassium: 3.8 mmol/L (ref 3.5–5.3)
SODIUM: 139 mmol/L (ref 135–146)

## 2016-01-18 LAB — HEMOGLOBIN A1C
Hgb A1c MFr Bld: 6 % — ABNORMAL HIGH (ref <5.7)
Mean Plasma Glucose: 126 mg/dL

## 2016-01-18 LAB — LIPID PANEL
CHOL/HDL RATIO: 4.5 ratio (ref <=5.0)
CHOLESTEROL: 183 mg/dL (ref 125–200)
HDL: 41 mg/dL — ABNORMAL LOW (ref >=46)
LDL CALC: 116 mg/dL (ref <130)
TRIGLYCERIDES: 131 mg/dL (ref <150)
VLDL: 26 mg/dL (ref <30)

## 2016-01-19 LAB — MICROALBUMIN, URINE: Microalb, Ur: 0.8 mg/dL

## 2016-01-20 ENCOUNTER — Ambulatory Visit
Admission: RE | Admit: 2016-01-20 | Discharge: 2016-01-20 | Disposition: A | Discharge status: Home / Self Care | Payer: PRIVATE HEALTH INSURANCE | Source: Ambulatory Visit | Attending: Internal Medicine | Admitting: Internal Medicine

## 2016-01-20 ENCOUNTER — Encounter: Payer: Self-pay | Admitting: Internal Medicine

## 2016-01-20 ENCOUNTER — Ambulatory Visit (INDEPENDENT_AMBULATORY_CARE_PROVIDER_SITE_OTHER): Payer: Medicare Other | Admitting: Internal Medicine

## 2016-01-20 VITALS — BP 124/78 | HR 64 | Temp 98.0°F | Ht 66.0 in | Wt 165.0 lb

## 2016-01-20 DIAGNOSIS — M25561 Pain in right knee: Secondary | ICD-10-CM

## 2016-01-20 DIAGNOSIS — K7581 Nonalcoholic steatohepatitis (NASH): Secondary | ICD-10-CM | POA: Diagnosis not present

## 2016-01-20 DIAGNOSIS — E119 Type 2 diabetes mellitus without complications: Secondary | ICD-10-CM

## 2016-01-20 DIAGNOSIS — R519 Headache, unspecified: Secondary | ICD-10-CM

## 2016-01-20 DIAGNOSIS — R51 Headache: Secondary | ICD-10-CM | POA: Diagnosis not present

## 2016-01-20 DIAGNOSIS — I10 Essential (primary) hypertension: Secondary | ICD-10-CM | POA: Diagnosis not present

## 2016-01-20 DIAGNOSIS — R05 Cough: Secondary | ICD-10-CM

## 2016-01-20 DIAGNOSIS — R748 Abnormal levels of other serum enzymes: Secondary | ICD-10-CM

## 2016-01-20 DIAGNOSIS — E785 Hyperlipidemia, unspecified: Secondary | ICD-10-CM

## 2016-01-20 DIAGNOSIS — M25519 Pain in unspecified shoulder: Secondary | ICD-10-CM

## 2016-01-20 DIAGNOSIS — R059 Cough, unspecified: Secondary | ICD-10-CM

## 2016-01-20 DIAGNOSIS — Z0000 Encounter for general adult medical examination without abnormal findings: Secondary | ICD-10-CM | POA: Diagnosis not present

## 2016-01-20 MED ORDER — BUTALBITAL-APAP-CAFFEINE 50-325-40 MG PO TABS: ORAL_TABLET | ORAL | Status: DC

## 2016-01-20 MED ORDER — LORAZEPAM 1 MG PO TABS: ORAL_TABLET | ORAL | Status: DC

## 2016-01-20 NOTE — Progress Notes (Deleted)
Patient ID: Tamara Yang, female   DOB: 11-01-1932, 82 y.o.   MRN: [id number]    Facility  [facility]    Place of Service:   OFFICE    Allergies  Allergen Reactions  . Daypro [Oxaprozin]   . Erythromycin   . Talwin [Pentazocine]     Chief Complaint  Patient presents with  . Annual Exam    Wellness exam   . Medical Management of Chronic Issues    medication management blood pressure, blood sugar, cholesterol. Here with sister Hassan Rowan  . Headache    2-3 times a week  . Cough    for 3 week, green stuff some times.   Marland Kitchen MMSE    30/30 passed clock drawing    HPI:  ***  Medications: Patient's Medications  New Prescriptions   No medications on file  Previous Medications   AMLODIPINE (NORVASC) 5 MG TABLET    TAKE 1 TABLET BY MOUTH EVERY DAY   ASPIRIN 81 MG TABLET    Take 81 mg by mouth daily.   CHOLECALCIFEROL (VITAMIN D) 1000 UNITS TABLET    Take two tablets once daily   CYCLOBENZAPRINE (FLEXERIL) 10 MG TABLET    One up to 3 times daily as needed to help muscle spasm and pain   ENALAPRIL (VASOTEC) 20 MG TABLET    TAKE 1 TABLET BY MOUTH TWICE DAILY FOR BLOOD PRESSURE   HYDROCHLOROTHIAZIDE (HYDRODIURIL) 25 MG TABLET    Take one tablet by mouth once daily for swelling   HYDROCODONE-ACETAMINOPHEN (NORCO) 10-325 MG TABLET    Take one tablet by mouth up to four times a day as needed for pain.   METOPROLOL SUCCINATE (TOPROL-XL) 50 MG 24 HR TABLET    Take one tablet twice a day for blood pressure.   OMEPRAZOLE (PRILOSEC) 20 MG CAPSULE    20 mg. Take one tablet at bedtime to reduce stomach acid and to help heartburn.  Modified Medications   Modified Medication Previous Medication   LORAZEPAM (ATIVAN) 1 MG TABLET LORazepam (ATIVAN) 1 MG tablet      Take one tablet by mouth every 4 hours as needed for anxiety    Take one tablet by mouth every 4 hours as needed for anxiety  Discontinued Medications   No medications on file    Review of Systems  Constitutional: Negative for fever,  activity change, appetite change, fatigue and unexpected weight change.  HENT: Positive for hearing loss and tinnitus. Negative for sore throat.   Eyes:       Corrective lenses. Normal diabetic eye exam by Dr. [name] 07/13/2010.  Respiratory: Negative for cough.   Cardiovascular: Negative.   Gastrointestinal: Negative.   Endocrine:       History of diabetes  Genitourinary: Negative.   Musculoskeletal: Positive for arthralgias, neck pain and neck stiffness.       Diffuse joint pains. Bilateral shoulder pain R>L. Generalized arthritis is present. Has right knee pains. Gait stable.  Skin: Negative.   Allergic/Immunologic: Negative.   Neurological: Negative for dizziness, tremors, seizures, facial asymmetry, speech difficulty, weakness and numbness.  Hematological: Negative.   Psychiatric/Behavioral: Negative.     Filed Vitals:   01/20/16 1318  BP: 124/78  Pulse: 64  Temp: 98 F (36.7 C)  TempSrc: Oral  Height: 5' 6" (1.676 m)  Weight: 165 lb (74.844 kg)  SpO2: 96%   Body mass index is 26.64 kg/(m^2). Wt Readings from Last 3 Encounters:  01/20/16 165 lb (74.844 kg)  07/29/15 171 lb (  77.565 kg)  01/07/15 182 lb 9.6 oz (82.827 kg)      Physical Exam  Constitutional: She is oriented to person, place, and time. She appears well-developed and well-nourished. No distress.  Mildly overweight.  HENT:  Bilateral partial deafness.  Eyes: Conjunctivae and EOM are normal. Pupils are equal, round, and reactive to light. Left eye exhibits no discharge. No scleral icterus.  Neck: Normal range of motion. Neck supple. No JVD present. No tracheal deviation present. No thyromegaly present.  Cardiovascular: Normal rate, regular rhythm and intact distal pulses.  Exam reveals no gallop and no friction rub.   No murmur heard. Pulmonary/Chest: Effort normal. No respiratory distress. She has no wheezes. She has no rales.  Abdominal: Soft. Bowel sounds are normal. She exhibits no distension and no  mass. There is no tenderness.  Musculoskeletal: She exhibits tenderness. She exhibits no edema.  Reduced ability to raise her arms. Bilateral shoulder pain. Right knee pains. Right knee effusion.  Lymphadenopathy:    She has no cervical adenopathy.  Neurological: She is alert and oriented to person, place, and time. No cranial nerve deficit. Coordination normal.  6- CIT score was 0  Skin: Skin is warm and dry. No rash noted. No erythema. No pallor.  Psychiatric: She has a normal mood and affect. Her behavior is normal. Judgment and thought content normal.    Labs reviewed: Lab Summary Latest Ref Rng 01/18/2016 07/27/2015 01/06/2015  Hemoglobin - (None) (None) (None)  Hematocrit - (None) (None) (None)  White count - (None) (None) (None)  Platelet count - (None) (None) (None)  Sodium 135 - 146 mmol/L 139 143 142  Potassium 3.5 - 5.3 mmol/L 3.8 3.9 4.0  Calcium 8.6 - 10.4 mg/dL 9.5 9.6 9.7  Phosphorus - (None) (None) (None)  Creatinine 0.60 - 0.88 mg/dL 1.02(H) 0.87 0.82  AST 0 - 40 IU/L (None) 29 29  Alk Phos 39 - 117 IU/L (None) 50 54  Bilirubin 0.0 - 1.2 mg/dL (None) 0.7 0.9  Glucose 65 - 99 mg/dL 107(H) 100(H) 107(H)  Cholesterol 125 - 200 mg/dL 183 (None) (None)  HDL cholesterol >=46 mg/dL 41(L) 45 (None)  Triglycerides <150 mg/dL 131 158(H) (None)  LDL Direct - (None) (None) (None)  LDL Calc <130 mg/dL 116 119(H) (None)  Total protein - (None) (None) (None)  Albumin 3.5 - 4.7 g/dL (None) 4.1 4.0   No results found for: TSH, T3TOTAL, T4TOTAL, THYROIDAB Lab Results  Component Value Date   BUN 18 01/18/2016   BUN 18 07/27/2015   BUN 14 01/06/2015   Lab Results  Component Value Date   HGBA1C 6.0* 01/18/2016   HGBA1C 6.1* 07/27/2015   HGBA1C 6.0* 01/06/2015    Assessment/Plan

## 2016-01-20 NOTE — Addendum Note (Signed)
Addended by: Logan Bores on: 01/20/2016 04:40 PM   Modules accepted: Orders, SmartSet

## 2016-01-20 NOTE — Addendum Note (Signed)
Addended by: Logan Bores on: 01/20/2016 04:43 PM   Modules accepted: Miquel Dunn

## 2016-01-20 NOTE — Progress Notes (Signed)
Patient ID: Tamara Yang, female   DOB: [birth year], 82 y.o.   MRN: [id number]   Location:   [facility]   Place of Service:   OFFICE  Patient Care Team: Estill Dooms, MD as PCP - General (Internal Medicine) Yehuda Mao, MD as Attending Physician (Rheumatology) Frederik Pear, MD as Consulting Physician (Orthopedic Surgery) Crista Luria, MD as Consulting Physician (Dermatology) Gery Pray, MD as Consulting Physician (Radiation Oncology) Mosie Epstein, CRNA as Referring Physician (Anesthesiology) Clent Jacks, MD as Consulting Physician (Ophthalmology)  Extended Emergency Contact Information Primary Emergency Contact: [facility] Address: [address]          [place], [place] [address] Johnnette Litter of [place] Phone: [phone] Mobile Phone: [phone] Relation: Son Secondary Emergency Contact: Jeanie Sewer [address] [place]nette Litter of [place] Phone: [phone] Mobile Phone: [phone] Relation: Sister  Code Status: full Goals of Care: Advanced Directive information Advanced Directives 01/20/2016  Does patient have an advance directive? No  Would patient like information on creating an advanced directive? -     Chief Complaint  Patient presents with  . Annual Exam    Wellness exam   . Medical Management of Chronic Issues    medication management blood pressure, blood sugar, cholesterol. Here with sister Tamara Yang  . Headache    2-3 times a week  . Cough    for 3 week, Teagen Mcleary stuff some times.   Marland Kitchen MMSE    30/30 passed clock drawing    HPI: Patient is a 82 y.o. female seen in today for an annual wellness exam.    Essential hypertension - Plan: EKG 12-Lead  Type 2 diabetes mellitus without complication, without long-term current use of insulin (HCC)  Hyperlipidemia  Liver enzyme elevation  Nonintractable headache, unspecified chronicity pattern, unspecified headache type  Cough    Depression screen [facility] 2/9 01/20/2016 07/29/2015  01/07/2015 05/07/2014 07/10/2013  Decreased Interest 0 0 0 0 0  Down, Depressed, Hopeless 1 0 0 0 -  PHQ - 2 Score 1 0 0 0 0    Fall Risk  01/20/2016 07/29/2015 01/07/2015 09/10/2014 05/07/2014  Falls in the past year? No No No No No     Health Maintenance  Topic Date Due  . PNA vac Low Risk Adult (2 of 2 - PCV13) 02/26/2006  . OPHTHALMOLOGY EXAM  10/17/2015  . ZOSTAVAX  07/04/2016 (Originally 06/20/1995)  . TETANUS/TDAP  07/04/2018 (Originally 02/27/2015)  . DEXA SCAN  07/05/2023 (Originally 06/19/2000)  . INFLUENZA VACCINE  02/02/2016  . HEMOGLOBIN A1C  07/20/2016  . FOOT EXAM  07/28/2016    Urinary incontinence? No   Functional Status Survey: Is the patient deaf or have difficulty hearing?: Yes Does the patient have difficulty seeing, even when wearing glasses/contacts?: No Does the patient have difficulty concentrating, remembering, or making decisions?: No Does the patient have difficulty walking or climbing stairs?: No Does the patient have difficulty dressing or bathing?: No Does the patient have difficulty doing errands alone such as visiting a doctor's office or shopping?: No Exercise? Diet?  Visual Acuity Screening   Right eye Left eye Both eyes  Without correction: 20/40 20/40 20/40   With correction:     Comments: Dr. [name] 6/16  Hearing:  dimished Dentition: false teeth Pain: chronic joint pains  Past Medical History  Diagnosis Date  . Anxiety state, unspecified   . Other malaise and fatigue   . Otalgia, unspecified   . Headache(784.0)   .  Dysphagia, unspecified(787.20)   . Unspecified vitamin D deficiency   . Pain in joint, shoulder region   . Pain in joint, pelvic region and thigh   . Pain in joint, lower leg   . Insomnia, unspecified   . Reflux esophagitis   . Type II or unspecified type diabetes mellitus without mention of complication, not stated as uncontrolled   . Carpal tunnel syndrome   . Other and unspecified hyperlipidemia   . Palpitations   .  Nonspecific elevation of levels of transaminase or lactic acid dehydrogenase (LDH)   . Unspecified essential hypertension   . Lumbago   . Other chronic nonalcoholic liver disease   . Other nonspecific abnormal serum enzyme levels   . Other malaise and fatigue   . Osteoarthrosis, unspecified whether generalized or localized, unspecified site   . Edema   . Depressive disorder, not elsewhere classified   . Malignant neoplasm of breast (female), unspecified site   . Degeneration of intervertebral disc, site unspecified     Past Surgical History  Procedure Laterality Date  . Dilation and curettage of uterus  1980  . Abdominal hysterectomy  1981  . Breast surgery  1982    cyst removed, left  . Cholecystectomy  1987  . Lumbar spine surgery  2002  . Total hip arthroplasty  2003    bilateral  . Back surgery  2003    L4-L5  . Breast lumpectomy Left 1994    lymph cancer   . Cataract extraction, bilateral     Family History  Problem Relation Age of Onset  . Heart disease Mother   . Cancer Father   . Diabetes Brother   . Cancer Brother    Family Status  Relation Status Death Age  . Mother Deceased   . Father Deceased   . Sister Alive   . Brother Alive   . Son Alive   . Brother Alive   . Son Alive   . Son Alive      Social History   Social History  . Marital Status: Widowed    Spouse Name: N/A  . Number of Children: N/A  . Years of Education: N/A   Occupational History  . retired     Social History Main Topics  . Smoking status: Never Smoker   . Smokeless tobacco: Never Used  . Alcohol Use: No  . Drug Use: No  . Sexual Activity: Not on file   Other Topics Concern  . Not on file   Social History Narrative   Lives with son and 2 grandsons   Widowed   Never smoked   Alcohol none   Exercise none   No Advance Directive   Still drives       Allergies  Allergen Reactions  . Daypro [Oxaprozin]   . Erythromycin   . Talwin [Pentazocine]         Medication List       This list is accurate as of: 01/20/16  1:49 PM.  Always use your most recent med list.               amLODipine 5 MG tablet  Commonly known as:  NORVASC  TAKE 1 TABLET BY MOUTH EVERY DAY     aspirin 81 MG tablet  Take 81 mg by mouth daily.     cholecalciferol 1000 units tablet  Commonly known as:  VITAMIN D  Take two tablets once daily     cyclobenzaprine 10 MG tablet  Commonly known as:  FLEXERIL  One up to 3 times daily as needed to help muscle spasm and pain     enalapril 20 MG tablet  Commonly known as:  VASOTEC  TAKE 1 TABLET BY MOUTH TWICE DAILY FOR BLOOD PRESSURE     hydrochlorothiazide 25 MG tablet  Commonly known as:  HYDRODIURIL  Take one tablet by mouth once daily for swelling     HYDROcodone-acetaminophen 10-325 MG tablet  Commonly known as:  NORCO  Take one tablet by mouth up to four times a day as needed for pain.     LORazepam 1 MG tablet  Commonly known as:  ATIVAN  Take one tablet by mouth every 4 hours as needed for anxiety     metoprolol succinate 50 MG 24 hr tablet  Commonly known as:  TOPROL-XL  Take one tablet twice a day for blood pressure.     omeprazole 20 MG capsule  Commonly known as:  PRILOSEC  20 mg. Take one tablet at bedtime to reduce stomach acid and to help heartburn.         Review of Systems:  Review of Systems  Constitutional: Negative for fever, activity change, appetite change, fatigue and unexpected weight change.  HENT: Positive for hearing loss and tinnitus. Negative for sore throat.   Eyes:       Corrective lenses. Normal diabetic eye exam by Dr. [name] 07/13/2010. Cataract surgery bilaterally in June 2016.  Respiratory: Positive for cough.   Cardiovascular: Negative.   Gastrointestinal: Negative.   Endocrine:       History of diabetes  Genitourinary: Negative.   Musculoskeletal: Positive for back pain, arthralgias, neck pain and neck stiffness.       Diffuse joint pains. Bilateral  shoulder pain R>L. Generalized arthritis is present. Has right knee pains. Gait stable.  Skin: Negative.   Allergic/Immunologic: Negative.   Neurological: Positive for headaches. Negative for dizziness, tremors, seizures, facial asymmetry, speech difficulty, weakness and numbness.  Hematological: Negative.   Psychiatric/Behavioral: Negative.     Physical Exam: Filed Vitals:   01/20/16 1318  BP: 124/78  Pulse: 64  Temp: 98 F (36.7 C)  TempSrc: Oral  Height: 5\' 6"  (1.676 m)  Weight: 165 lb (74.844 kg)  SpO2: 96%   Body mass index is 26.64 kg/(m^2). Physical Exam  Constitutional: She is oriented to person, place, and time. She appears well-developed and well-nourished. No distress.  Mildly overweight.  HENT:  Bilateral partial deafness.  Eyes: Conjunctivae and EOM are normal. Pupils are equal, round, and reactive to light. Left eye exhibits no discharge. No scleral icterus.  Neck: Normal range of motion. Neck supple. No JVD present. No tracheal deviation present. No thyromegaly present.  Cardiovascular: Normal rate, regular rhythm and intact distal pulses.  Exam reveals no gallop and no friction rub.   No murmur heard. Pulmonary/Chest: Effort normal. No respiratory distress. She has no wheezes. She has no rales.  Abdominal: Soft. Bowel sounds are normal. She exhibits no distension and no mass. There is no tenderness.  Musculoskeletal: She exhibits tenderness. She exhibits no edema.  Reduced ability to raise her arms. Bilateral shoulder pain. Right knee pains. Right knee effusion.  Lymphadenopathy:    She has no cervical adenopathy.  Neurological: She is alert and oriented to person, place, and time. No cranial nerve deficit. Coordination normal.  01/20/16 MMSE 30/30  Skin: Skin is warm and dry. No rash noted. No erythema. No pallor.  Psychiatric: She has a normal mood and  affect. Her behavior is normal. Judgment and thought content normal.    Labs reviewed: Basic Metabolic  Panel:  Recent Labs  07/27/15 0935 01/18/16 0921  NA 143 139  K 3.9 3.8  CL 103 104  CO2 26 24  GLUCOSE 100* 107*  BUN 18 18  CREATININE 0.87 1.02*  CALCIUM 9.6 9.5    Recent Labs  07/27/15 0935  AST 29  ALT 29  ALKPHOS 50  BILITOT 0.7  PROT 6.5  ALBUMIN 4.1    Lipid Panel:  Recent Labs  07/27/15 0935 01/18/16 0921  CHOL 196 183  HDL 45 41*  LDLCALC 119* 116  TRIG 158* 131  CHOLHDL 4.4 4.5   Lab Results  Component Value Date   HGBA1C 6.0* 01/18/2016   01/20/16 EKG: rate 70. NSR. Normal.  Procedures: No results found.  Assessment/Plan 1. Essential hypertension - EKG 12-Lead:; normal  2. Type 2 diabetes mellitus without complication, without long-term current use of insulin (HCC) controlled  3. Hyperlipidemia controlled  4. Liver enzyme elevation normal  5. Nonintractable headache, unspecified chronicity pattern, unspecified headache type Improved -[name]  6. Cough -CXR  7. Arthralgia of shoulder, unspecified laterality unchanged  8. Arthralgia of right lower leg unchanged  9. Nonalcoholic steatohepatitis (NASH) Chronic. unchanged

## 2016-01-20 NOTE — Addendum Note (Signed)
Addended by: Logan Bores on: 01/20/2016 04:36 PM   Modules accepted: Orders, SmartSet

## 2016-01-20 NOTE — Addendum Note (Signed)
Addended by: Logan Bores on: 01/20/2016 04:34 PM   Modules accepted: Miquel Dunn

## 2016-01-20 NOTE — Addendum Note (Signed)
Addended by: Logan Bores on: 01/20/2016 04:37 PM   Modules accepted: Miquel Dunn

## 2016-01-21 ENCOUNTER — Other Ambulatory Visit: Payer: Self-pay

## 2016-01-21 DIAGNOSIS — R9389 Abnormal findings on diagnostic imaging of other specified body structures: Secondary | ICD-10-CM

## 2016-01-21 DIAGNOSIS — R911 Solitary pulmonary nodule: Secondary | ICD-10-CM

## 2016-01-21 DIAGNOSIS — J209 Acute bronchitis, unspecified: Secondary | ICD-10-CM

## 2016-01-21 MED ORDER — DOXYCYCLINE HYCLATE 100 MG PO CAPS: 100.0000 mg | ORAL_CAPSULE | Freq: Two times a day (BID) | ORAL | Status: AC

## 2016-01-22 NOTE — Addendum Note (Signed)
Addended by: Logan Bores on: 01/22/2016 02:09 PM   Modules accepted: Miquel Dunn

## 2016-01-26 ENCOUNTER — Ambulatory Visit
Admission: RE | Admit: 2016-01-26 | Discharge: 2016-01-26 | Disposition: A | Discharge status: Home / Self Care | Payer: PRIVATE HEALTH INSURANCE | Source: Ambulatory Visit | Attending: Internal Medicine | Admitting: Internal Medicine

## 2016-01-26 DIAGNOSIS — R9389 Abnormal findings on diagnostic imaging of other specified body structures: Secondary | ICD-10-CM

## 2016-01-26 DIAGNOSIS — R911 Solitary pulmonary nodule: Secondary | ICD-10-CM

## 2016-01-29 LAB — HM DIABETES EYE EXAM

## 2016-02-01 ENCOUNTER — Other Ambulatory Visit: Payer: Self-pay

## 2016-02-01 DIAGNOSIS — R9389 Abnormal findings on diagnostic imaging of other specified body structures: Secondary | ICD-10-CM

## 2016-02-10 ENCOUNTER — Other Ambulatory Visit: Payer: Self-pay | Admitting: Internal Medicine

## 2016-02-10 DIAGNOSIS — I10 Essential (primary) hypertension: Secondary | ICD-10-CM

## 2016-02-17 ENCOUNTER — Other Ambulatory Visit: Payer: Self-pay | Admitting: *Deleted

## 2016-02-17 DIAGNOSIS — M25519 Pain in unspecified shoulder: Secondary | ICD-10-CM

## 2016-02-17 MED ORDER — HYDROCODONE-ACETAMINOPHEN 10-325 MG PO TABS: ORAL_TABLET | ORAL | 0 refills | Status: DC

## 2016-02-17 NOTE — Telephone Encounter (Signed)
Patient requested and will pick up 

## 2016-02-23 ENCOUNTER — Other Ambulatory Visit: Payer: Self-pay | Admitting: Nurse Practitioner

## 2016-03-22 ENCOUNTER — Other Ambulatory Visit: Payer: Self-pay | Admitting: *Deleted

## 2016-03-22 DIAGNOSIS — M25519 Pain in unspecified shoulder: Secondary | ICD-10-CM

## 2016-03-22 MED ORDER — HYDROCODONE-ACETAMINOPHEN 10-325 MG PO TABS: ORAL_TABLET | ORAL | 0 refills | Status: DC

## 2016-03-22 NOTE — Telephone Encounter (Signed)
Patient requested and will pick up 

## 2016-04-18 ENCOUNTER — Ambulatory Visit: Payer: Medicare Other

## 2016-04-27 ENCOUNTER — Other Ambulatory Visit: Payer: Self-pay | Admitting: *Deleted

## 2016-04-27 MED ORDER — HYDROCODONE-ACETAMINOPHEN 10-325 MG PO TABS: ORAL_TABLET | ORAL | 0 refills | Status: DC

## 2016-04-27 NOTE — Telephone Encounter (Signed)
Patient requested and will pick up 

## 2016-04-28 ENCOUNTER — Other Ambulatory Visit: Payer: Self-pay

## 2016-04-28 MED ORDER — HYDROCODONE-ACETAMINOPHEN 10-325 MG PO TABS: ORAL_TABLET | ORAL | 0 refills | Status: DC

## 2016-04-28 NOTE — Telephone Encounter (Signed)
Reprint of prescription. Original prescription was misplaced.

## 2016-05-10 ENCOUNTER — Telehealth: Payer: Self-pay | Admitting: *Deleted

## 2016-05-10 MED ORDER — LOPERAMIDE HCL 2 MG PO TABS: ORAL_TABLET | ORAL | 0 refills | Status: DC

## 2016-05-10 NOTE — Telephone Encounter (Signed)
Patient notified and agreed.  

## 2016-05-10 NOTE — Telephone Encounter (Signed)
Patient called and stated she has been sick since Saturday. Stomach hurting and feels like its in knots. Started throwing up and diarrhea. Has been taking pepto bismol and electrolytes. Ate some chicken/rice. Wants to know if you can call something in for the diarrhea. No available appointment. Please Advise.

## 2016-05-10 NOTE — Telephone Encounter (Signed)
Imodium 2 mg with each loose stool up to 8 tablets in 24 hours. Available OTC.

## 2016-05-10 NOTE — Telephone Encounter (Signed)
#   busy. Will try again later

## 2016-05-12 ENCOUNTER — Other Ambulatory Visit: Payer: Self-pay | Admitting: Internal Medicine

## 2016-05-14 ENCOUNTER — Other Ambulatory Visit: Payer: Self-pay | Admitting: Internal Medicine

## 2016-05-14 DIAGNOSIS — I10 Essential (primary) hypertension: Secondary | ICD-10-CM

## 2016-05-19 ENCOUNTER — Ambulatory Visit
Admission: RE | Admit: 2016-05-19 | Discharge: 2016-05-19 | Disposition: A | Discharge status: Home / Self Care | Payer: Medicare Other | Source: Ambulatory Visit | Attending: Internal Medicine | Admitting: Internal Medicine

## 2016-05-19 DIAGNOSIS — R9389 Abnormal findings on diagnostic imaging of other specified body structures: Secondary | ICD-10-CM

## 2016-05-23 ENCOUNTER — Other Ambulatory Visit: Payer: Self-pay | Admitting: Internal Medicine

## 2016-05-25 ENCOUNTER — Encounter: Payer: Self-pay | Admitting: Internal Medicine

## 2016-05-25 ENCOUNTER — Ambulatory Visit (INDEPENDENT_AMBULATORY_CARE_PROVIDER_SITE_OTHER): Payer: Medicare Other | Admitting: Internal Medicine

## 2016-05-25 VITALS — BP 142/84 | HR 90 | Temp 97.8°F | Ht 66.0 in | Wt 151.0 lb

## 2016-05-25 DIAGNOSIS — R197 Diarrhea, unspecified: Secondary | ICD-10-CM

## 2016-05-25 DIAGNOSIS — I10 Essential (primary) hypertension: Secondary | ICD-10-CM | POA: Diagnosis not present

## 2016-05-25 DIAGNOSIS — R748 Abnormal levels of other serum enzymes: Secondary | ICD-10-CM

## 2016-05-25 DIAGNOSIS — K921 Melena: Secondary | ICD-10-CM | POA: Diagnosis not present

## 2016-05-25 DIAGNOSIS — R634 Abnormal weight loss: Secondary | ICD-10-CM | POA: Diagnosis not present

## 2016-05-25 MED ORDER — LORAZEPAM 1 MG PO TABS: ORAL_TABLET | ORAL | 1 refills | Status: DC

## 2016-05-25 NOTE — Progress Notes (Signed)
Facility  [facility]    Place of Service:   OFFICE    Allergies  Allergen Reactions  . Daypro [Oxaprozin]   . Erythromycin   . Talwin [Pentazocine]     Chief Complaint  Patient presents with  . Acute Visit    05/07/16 diarrhea for 5 days, vomited once, imodium helped. Then 05/21/16 had diarrhea one day, some blood. Afraid to eat much might get diarrhea. Hasn't been taking BP Rx because she's not eating.  Here with sister Hassan Rowan.    HPI:  Diarrhea, unspecified type - episodes of diarrhea starting 05/07/16 after eating a fish sandwich. Took Imodium and improved over the next 4 days. No blood in stool at that time. Did have one night of sweats and a chill. No nausea or vomiting. Denies abd pains then. On 05/21/16, she again developed loose stools after eating a chicken sandwich. She has not had fever or chills. No abd pain. Saw small amount of BRB in stool on one occasion. Stools have started to get firm again. She is not eating much due to fear she may get diarrhea again. Has never had colon exam.  Essential hypertension - near normal off medication  Liver enzyme elevation - follow up. Presumably due to NASH in the past.  Blood in stool -small amount when she had loose stool.  Weight loss of 20# since Jan 2017 and 14# since July 2017.  Medications: Patient's Medications  New Prescriptions   No medications on file  Previous Medications   AMLODIPINE (NORVASC) 5 MG TABLET    TAKE 1 TABLET BY MOUTH EVERY DAY   ASPIRIN 81 MG TABLET    Take 81 mg by mouth daily.   BUTALBITAL-ACETAMINOPHEN-CAFFEINE (FIORICET) 50-325-40 MG TABLET    Take one or 2 tablets every 6 hours if needed for headache   CHOLECALCIFEROL (VITAMIN D) 1000 UNITS TABLET    Take two tablets once daily   CYCLOBENZAPRINE (FLEXERIL) 10 MG TABLET    One up to 3 times daily as needed to help muscle spasm and pain   ENALAPRIL (VASOTEC) 20 MG TABLET    TAKE 1 TABLET BY MOUTH TWICE DAILY FOR BLOOD PRESSURE   HYDROCHLOROTHIAZIDE  (HYDRODIURIL) 25 MG TABLET    TAKE 1 TABLET BY MOUTH EVERY DAY FOR SWELLING   HYDROCODONE-ACETAMINOPHEN (NORCO) 10-325 MG TABLET    Take one tablet by mouth up to four times a day as needed for pain.   LOPERAMIDE (IMODIUM A-D) 2 MG TABLET    Take one tablet by mouth with each loose stool up to 8 tablets in 24 hours   METOPROLOL SUCCINATE (TOPROL-XL) 50 MG 24 HR TABLET    TAKE 1 TABLET BY MOUTH TWICE DAILY FOR BLOOD PRESSURE   OMEPRAZOLE (PRILOSEC) 20 MG CAPSULE    20 mg. Take one tablet at bedtime to reduce stomach acid and to help heartburn.  Modified Medications   Modified Medication Previous Medication   LORAZEPAM (ATIVAN) 1 MG TABLET LORazepam (ATIVAN) 1 MG tablet      Take one tablet by mouth every 4 hours as needed for anxiety    Take one tablet by mouth every 4 hours as needed for anxiety  Discontinued Medications   No medications on file    Review of Systems  Constitutional: Negative for activity change, appetite change, fatigue, fever and unexpected weight change.       20# weight loss since Jan 2017.  HENT: Positive for hearing loss and tinnitus. Negative for sore throat.  Eyes:       Corrective lenses. Normal diabetic eye exam by Dr. [name] 07/13/2010. Cataract surgery bilaterally in June 2016.  Respiratory: Positive for cough.   Cardiovascular: Negative.   Gastrointestinal: Positive for diarrhea.       Small amount of blood in the stool on 05/21/16.  Endocrine:       History of diabetes  Genitourinary: Negative.   Musculoskeletal: Positive for arthralgias, back pain, neck pain and neck stiffness.       Diffuse joint pains. Bilateral shoulder pain R>L. Generalized arthritis is present. Has right knee pains. Gait stable.  Skin: Negative.   Allergic/Immunologic: Negative.   Neurological: Positive for headaches. Negative for dizziness, tremors, seizures, facial asymmetry, speech difficulty, weakness and numbness.  Hematological: Negative.   Psychiatric/Behavioral: Negative.          "Feeling tremulous inside"    Vitals:   05/25/16 1553  BP: (!) 142/84  Pulse: 90  Temp: 97.8 F (36.6 C)  TempSrc: Oral  SpO2: 96%  Weight: 151 lb (68.5 kg)  Height: '5\' 6"'$  (1.676 m)   Body mass index is 24.37 kg/m. Wt Readings from Last 3 Encounters:  05/25/16 151 lb (68.5 kg)  01/20/16 165 lb (74.8 kg)  07/29/15 171 lb (77.6 kg)      Physical Exam  Constitutional: She is oriented to person, place, and time. She appears well-developed and well-nourished. No distress.  Mildly overweight.  HENT:  Bilateral partial deafness.  Eyes: Conjunctivae and EOM are normal. Pupils are equal, round, and reactive to light. Left eye exhibits no discharge. No scleral icterus.  Neck: Normal range of motion. Neck supple. No JVD present. No tracheal deviation present. No thyromegaly present.  Cardiovascular: Normal rate, regular rhythm and intact distal pulses.  Exam reveals no gallop and no friction rub.   No murmur heard. Pulmonary/Chest: Effort normal. No respiratory distress. She has no wheezes. She has no rales.  Abdominal: Soft. Bowel sounds are normal. She exhibits no distension and no mass. There is no tenderness.  Genitourinary: Rectal exam shows guaiac positive stool. Vaginal discharge found.  Genitourinary Comments: Small internal hemorrhoid  Musculoskeletal: She exhibits tenderness. She exhibits no edema.  Reduced ability to raise her arms. Bilateral shoulder pain. Right knee pains. Right knee effusion.  Lymphadenopathy:    She has no cervical adenopathy.  Neurological: She is alert and oriented to person, place, and time. No cranial nerve deficit. Coordination normal.  01/20/16 MMSE 30/30  Skin: Skin is warm and dry. No rash noted. No erythema. No pallor.  Psychiatric: She has a normal mood and affect. Her behavior is normal. Judgment and thought content normal.    Labs reviewed: Lab Summary Latest Ref Rng & Units 01/18/2016 07/27/2015 01/06/2015  Hemoglobin - (None)  (None) (None)  Hematocrit - (None) (None) (None)  White count - (None) (None) (None)  Platelet count - (None) (None) (None)  Sodium 135 - 146 mmol/L 139 143 142  Potassium 3.5 - 5.3 mmol/L 3.8 3.9 4.0  Calcium 8.6 - 10.4 mg/dL 9.5 9.6 9.7  Phosphorus - (None) (None) (None)  Creatinine 0.60 - 0.88 mg/dL 1.02(H) 0.87 0.82  AST 0 - 40 IU/L (None) 29 29  Alk Phos 39 - 117 IU/L (None) 50 54  Bilirubin 0.0 - 1.2 mg/dL (None) 0.7 0.9  Glucose 65 - 99 mg/dL 107(H) 100(H) 107(H)  Cholesterol 125 - 200 mg/dL 183 (None) (None)  HDL cholesterol >=46 mg/dL 41(L) 45 (None)  Triglycerides <150 mg/dL 131 158(H) (None)  LDL Direct - (None) (None) (None)  LDL Calc <130 mg/dL 116 119(H) (None)  Total protein - (None) (None) (None)  Albumin 3.5 - 4.7 g/dL (None) 4.1 4.0  Some recent data might be hidden   No results found for: TSH, T3TOTAL, T4TOTAL, THYROIDAB Lab Results  Component Value Date   BUN 18 01/18/2016   BUN 18 07/27/2015   BUN 14 01/06/2015   Lab Results  Component Value Date   HGBA1C 6.0 (H) 01/18/2016   HGBA1C 6.1 (H) 07/27/2015   HGBA1C 6.0 (H) 01/06/2015    Assessment/Plan  1. Diarrhea, unspecified type - TSH - C. difficile, PCR  2. Essential hypertension - Urinalysis - Comprehensive metabolic panel  3. Liver enzyme elevation -CMP  4. Blood in stool - CBC with Differential/Platelet  5. Weight loss -TSH, CMP

## 2016-05-26 LAB — COMPREHENSIVE METABOLIC PANEL
ALT: 10 U/L (ref 6–29)
AST: 22 U/L (ref 10–35)
Albumin: 3.5 g/dL — ABNORMAL LOW (ref 3.6–5.1)
Alkaline Phosphatase: 48 U/L (ref 33–130)
BUN: 17 mg/dL (ref 7–25)
CHLORIDE: 105 mmol/L (ref 98–110)
CO2: 23 mmol/L (ref 20–31)
CREATININE: 0.97 mg/dL — AB (ref 0.60–0.88)
Calcium: 9.8 mg/dL (ref 8.6–10.4)
GLUCOSE: 110 mg/dL — AB (ref 65–99)
Potassium: 4 mmol/L (ref 3.5–5.3)
SODIUM: 140 mmol/L (ref 135–146)
Total Bilirubin: 0.8 mg/dL (ref 0.2–1.2)
Total Protein: 6.5 g/dL (ref 6.1–8.1)

## 2016-05-26 LAB — CBC WITH DIFFERENTIAL/PLATELET
Basophils Absolute: 0 cells/uL (ref 0–200)
Basophils Relative: 0 %
EOS PCT: 1 %
Eosinophils Absolute: 59 cells/uL (ref 15–500)
HCT: 39.8 % (ref 35.0–45.0)
HEMOGLOBIN: 13.4 g/dL (ref 11.7–15.5)
LYMPHS ABS: 2065 {cells}/uL (ref 850–3900)
LYMPHS PCT: 35 %
MCH: 31.1 pg (ref 27.0–33.0)
MCHC: 33.7 g/dL (ref 32.0–36.0)
MCV: 92.3 fL (ref 80.0–100.0)
MONOS PCT: 6 %
MPV: 11 fL (ref 7.5–12.5)
Monocytes Absolute: 354 cells/uL (ref 200–950)
NEUTROS PCT: 58 %
Neutro Abs: 3422 cells/uL (ref 1500–7800)
PLATELETS: 302 10*3/uL (ref 140–400)
RBC: 4.31 MIL/uL (ref 3.80–5.10)
RDW: 13.4 % (ref 11.0–15.0)
WBC: 5.9 10*3/uL (ref 3.8–10.8)

## 2016-05-26 LAB — TSH: TSH: 2.51 m[IU]/L

## 2016-05-31 LAB — CLOSTRIDIUM DIFFICILE BY PCR: Toxigenic C. Difficile by PCR: NOT DETECTED

## 2016-06-15 ENCOUNTER — Encounter: Payer: Self-pay | Admitting: Internal Medicine

## 2016-06-15 ENCOUNTER — Ambulatory Visit (INDEPENDENT_AMBULATORY_CARE_PROVIDER_SITE_OTHER): Payer: Medicare Other | Admitting: Internal Medicine

## 2016-06-15 VITALS — BP 124/68 | HR 68 | Temp 97.8°F | Ht 66.0 in | Wt 153.0 lb

## 2016-06-15 DIAGNOSIS — R197 Diarrhea, unspecified: Secondary | ICD-10-CM | POA: Diagnosis not present

## 2016-06-15 DIAGNOSIS — I10 Essential (primary) hypertension: Secondary | ICD-10-CM

## 2016-06-15 NOTE — Patient Instructions (Addendum)
Cancel appt for lab and OV in Jan 2018. Reschedule for March

## 2016-06-15 NOTE — Progress Notes (Signed)
Facility  [facility]    Place of Service:   OFFICE    Allergies  Allergen Reactions  . Daypro [Oxaprozin]   . Erythromycin   . Talwin [Pentazocine]     Chief Complaint  Patient presents with  . Medical Management of Chronic Issues    3 week follow-up diarrhea. Eating better, no more diarrhea. Here with sister Hassan Rowan    HPI:  Last seen 05/25/16 for diarrhea. C.dif, CBC, CMP, and TSH were normal.  Diarrhea, unspecified type - resolved. She has never had colonoscopy, but says she is willing to have one next year.  Essential hypertension - controlled    Medications: Patient's Medications  New Prescriptions   No medications on file  Previous Medications   AMLODIPINE (NORVASC) 5 MG TABLET    TAKE 1 TABLET BY MOUTH EVERY DAY   ASPIRIN 81 MG TABLET    Take 81 mg by mouth daily.   BUTALBITAL-ACETAMINOPHEN-CAFFEINE (FIORICET) 50-325-40 MG TABLET    Take one or 2 tablets every 6 hours if needed for headache   CHOLECALCIFEROL (VITAMIN D) 1000 UNITS TABLET    Take two tablets once daily   CYCLOBENZAPRINE (FLEXERIL) 10 MG TABLET    One up to 3 times daily as needed to help muscle spasm and pain   ENALAPRIL (VASOTEC) 20 MG TABLET    TAKE 1 TABLET BY MOUTH TWICE DAILY FOR BLOOD PRESSURE   HYDROCHLOROTHIAZIDE (HYDRODIURIL) 25 MG TABLET    TAKE 1 TABLET BY MOUTH EVERY DAY FOR SWELLING   HYDROCODONE-ACETAMINOPHEN (NORCO) 10-325 MG TABLET    Take one tablet by mouth up to four times a day as needed for pain.   LOPERAMIDE (IMODIUM A-D) 2 MG TABLET    Take one tablet by mouth with each loose stool up to 8 tablets in 24 hours   LORAZEPAM (ATIVAN) 1 MG TABLET    Take one tablet by mouth every 4 hours as needed for anxiety   METOPROLOL SUCCINATE (TOPROL-XL) 50 MG 24 HR TABLET    TAKE 1 TABLET BY MOUTH TWICE DAILY FOR BLOOD PRESSURE   OMEPRAZOLE (PRILOSEC) 20 MG CAPSULE    20 mg. Take one tablet at bedtime to reduce stomach acid and to help heartburn.  Modified Medications   No medications on file   Discontinued Medications   No medications on file    Review of Systems  Constitutional: Negative for activity change, appetite change, fatigue, fever and unexpected weight change.       20# weight loss since Jan 2017.  HENT: Positive for hearing loss and tinnitus. Negative for sore throat.   Eyes:       Corrective lenses. Normal diabetic eye exam by Dr. [name] 07/13/2010. Cataract surgery bilaterally in June 2016.  Respiratory: Positive for cough.   Cardiovascular: Negative.   Gastrointestinal: Positive for diarrhea.       Small amount of blood in the stool on 05/21/16.  Endocrine:       History of diabetes  Genitourinary: Negative.   Musculoskeletal: Positive for arthralgias, back pain, neck pain and neck stiffness.       Diffuse joint pains. Bilateral shoulder pain R>L. Generalized arthritis is present. Has right knee pains. Gait stable.  Skin: Negative.   Allergic/Immunologic: Negative.   Neurological: Positive for headaches. Negative for dizziness, tremors, seizures, facial asymmetry, speech difficulty, weakness and numbness.  Hematological: Negative.   Psychiatric/Behavioral: Negative.        "Feeling tremulous inside"    Vitals:   06/15/16  0952  BP: 124/68  Pulse: 68  Temp: 97.8 F (36.6 C)  TempSrc: Oral  SpO2: 98%  Weight: 153 lb (69.4 kg)  Height: '5\' 6"'$  (1.676 m)   Body mass index is 24.69 kg/m. Wt Readings from Last 3 Encounters:  06/15/16 153 lb (69.4 kg)  05/25/16 151 lb (68.5 kg)  01/20/16 165 lb (74.8 kg)      Physical Exam  Constitutional: She is oriented to person, place, and time. She appears well-developed and well-nourished. No distress.  Mildly overweight.  HENT:  Bilateral partial deafness.  Eyes: Conjunctivae and EOM are normal. Pupils are equal, round, and reactive to light. Left eye exhibits no discharge. No scleral icterus.  Neck: Normal range of motion. Neck supple. No JVD present. No tracheal deviation present. No thyromegaly present.    Cardiovascular: Normal rate, regular rhythm and intact distal pulses.  Exam reveals no gallop and no friction rub.   No murmur heard. Pulmonary/Chest: Effort normal. No respiratory distress. She has no wheezes. She has no rales.  Abdominal: Soft. Bowel sounds are normal. She exhibits no distension and no mass. There is no tenderness.  Genitourinary: Rectal exam shows guaiac positive stool. Vaginal discharge found.  Genitourinary Comments: Small internal hemorrhoid  Musculoskeletal: She exhibits tenderness. She exhibits no edema.  Reduced ability to raise her arms. Bilateral shoulder pain. Right knee pains. Right knee effusion.  Lymphadenopathy:    She has no cervical adenopathy.  Neurological: She is alert and oriented to person, place, and time. No cranial nerve deficit. Coordination normal.  01/20/16 MMSE 30/30  Skin: Skin is warm and dry. No rash noted. No erythema. No pallor.  Psychiatric: She has a normal mood and affect. Her behavior is normal. Judgment and thought content normal.    Labs reviewed: Lab Summary Latest Ref Rng & Units 05/25/2016 01/18/2016 07/27/2015  Hemoglobin 11.7 - 15.5 g/dL 13.4 (None) (None)  Hematocrit 35.0 - 45.0 % 39.8 (None) (None)  White count 3.8 - 10.8 K/uL 5.9 (None) (None)  Platelet count 140 - 400 K/uL 302 (None) (None)  Sodium 135 - 146 mmol/L 140 139 143  Potassium 3.5 - 5.3 mmol/L 4.0 3.8 3.9  Calcium 8.6 - 10.4 mg/dL 9.8 9.5 9.6  Phosphorus - (None) (None) (None)  Creatinine 0.60 - 0.88 mg/dL 0.97(H) 1.02(H) 0.87  AST 10 - 35 U/L 22 (None) 29  Alk Phos 33 - 130 U/L 48 (None) 50  Bilirubin 0.2 - 1.2 mg/dL 0.8 (None) 0.7  Glucose 65 - 99 mg/dL 110(H) 107(H) 100(H)  Cholesterol 125 - 200 mg/dL (None) 183 (None)  HDL cholesterol >=46 mg/dL (None) 41(L) 45  Triglycerides <150 mg/dL (None) 131 158(H)  LDL Direct - (None) (None) (None)  LDL Calc <130 mg/dL (None) 116 119(H)  Total protein 6.1 - 8.1 g/dL 6.5 (None) (None)  Albumin 3.6 - 5.1 g/dL  3.5(L) (None) 4.1  Some recent data might be hidden   Lab Results  Component Value Date   TSH 2.51 05/25/2016   Lab Results  Component Value Date   BUN 17 05/25/2016   BUN 18 01/18/2016   BUN 18 07/27/2015   Lab Results  Component Value Date   HGBA1C 6.0 (H) 01/18/2016   HGBA1C 6.1 (H) 07/27/2015   HGBA1C 6.0 (H) 01/06/2015    Assessment/Plan  1. Diarrhea, unspecified type -schedule colonoscopy  2. Essential hypertension controlled

## 2016-06-22 ENCOUNTER — Other Ambulatory Visit: Payer: Self-pay | Admitting: *Deleted

## 2016-06-22 ENCOUNTER — Other Ambulatory Visit: Payer: Self-pay | Admitting: Nurse Practitioner

## 2016-06-22 MED ORDER — HYDROCODONE-ACETAMINOPHEN 10-325 MG PO TABS: ORAL_TABLET | ORAL | 0 refills | Status: DC

## 2016-06-22 NOTE — Telephone Encounter (Signed)
Patient requested and will  Pick up

## 2016-07-19 ENCOUNTER — Encounter: Payer: Self-pay | Admitting: Nurse Practitioner

## 2016-07-19 ENCOUNTER — Ambulatory Visit (INDEPENDENT_AMBULATORY_CARE_PROVIDER_SITE_OTHER): Payer: Medicare HMO | Admitting: Nurse Practitioner

## 2016-07-19 VITALS — BP 118/68 | HR 76 | Temp 97.6°F | Resp 18 | Ht 66.0 in | Wt 141.0 lb

## 2016-07-19 DIAGNOSIS — R8290 Unspecified abnormal findings in urine: Secondary | ICD-10-CM

## 2016-07-19 DIAGNOSIS — J209 Acute bronchitis, unspecified: Secondary | ICD-10-CM | POA: Diagnosis not present

## 2016-07-19 DIAGNOSIS — R1903 Right lower quadrant abdominal swelling, mass and lump: Secondary | ICD-10-CM

## 2016-07-19 LAB — POCT URINALYSIS DIPSTICK
Blood, UA: NEGATIVE
GLUCOSE UA: 1000
Ketones, UA: NEGATIVE
NITRITE UA: POSITIVE
Spec Grav, UA: 1.01
Urobilinogen, UA: 0.2
pH, UA: 6

## 2016-07-19 NOTE — Patient Instructions (Signed)
Will get CT scan to evaluate mass in your lower quadrant

## 2016-07-19 NOTE — Progress Notes (Signed)
Careteam: Patient Care Team: Estill Dooms, MD as PCP - General (Internal Medicine) Yehuda Mao, MD as Attending Physician (Rheumatology) Frederik Pear, MD as Consulting Physician (Orthopedic Surgery) Crista Luria, MD as Consulting Physician (Dermatology) Gery Pray, MD as Consulting Physician (Radiation Oncology) Mosie Epstein, CRNA as Referring Physician (Anesthesiology) Clent Jacks, MD as Consulting Physician (Ophthalmology)  Advanced Directive information Does Patient Have a Medical Advance Directive?: No  Allergies  Allergen Reactions  . Daypro [Oxaprozin]   . Erythromycin   . Talwin [Pentazocine]     Chief Complaint  Patient presents with  . Acute Visit    aching, sweating, not feeling well x 1 week. Pt felt knot in stomach x 4 days ago.      HPI: Patient is a 83 y.o. female seen in the office today due to a recent cold and a knot in her stomach.   Her main concern is the knot in the right lower abdominal quadrant that she noticed 4 days ago when she felt tenderness in her stomach when coughing. She is unaware how long it has been there. Denies any pain at this time. Reports urgency and lower abdominal pressure with urination the past few days. States a lot of times she goes to the bathroom she doesn't make it and she sometimes goes in her panties. Has had a total hysterectomy. Has never had a colonoscopy.   Also c/o generalized achiness, night sweats, and a productive cough with clear sputum that began 1 week ago. Had nausea a few days ago, that has resolved. Reports a few nights she woke up and her whole body was so hot to the touch and her gown was soaked. She has not taken her temperature as she says she is too lazy. Her symptoms are improving and she is feeling better. She has been taking Mucinex DM 1 tablet twice a day for 3 days now and Aleve once for generalized aches.   Review of Systems:  Review of Systems  Constitutional: Positive for unexpected weight  change. Negative for appetite change, chills and fever.       30 lb weight loss in 1 year, 12 lb in 1 month   HENT: Positive for sneezing. Negative for congestion, rhinorrhea, sinus pain, sinus pressure and sore throat.   Respiratory: Positive for cough and shortness of breath. Negative for wheezing.   Cardiovascular: Negative for chest pain and palpitations.  Gastrointestinal: Positive for abdominal pain, diarrhea (off and on) and nausea. Negative for constipation and vomiting.  Genitourinary: Positive for urgency. Negative for difficulty urinating, frequency and hematuria.       Lower abdominal pressure when urinating.   Musculoskeletal: Positive for myalgias.  Neurological: Positive for weakness. Negative for dizziness, light-headedness and headaches.    Past Medical History:  Diagnosis Date  . Anxiety state, unspecified   . Carpal tunnel syndrome   . Degeneration of intervertebral disc, site unspecified   . Depressive disorder, not elsewhere classified   . Dysphagia, unspecified(787.20)   . Edema   . Headache(784.0)   . Insomnia, unspecified   . Lumbago   . Malignant neoplasm of breast (female), unspecified site   . Nonspecific elevation of levels of transaminase or lactic acid dehydrogenase (LDH)   . Osteoarthrosis, unspecified whether generalized or localized, unspecified site   . Otalgia, unspecified   . Other and unspecified hyperlipidemia   . Other chronic nonalcoholic liver disease   . Other malaise and fatigue   . Other malaise and fatigue   .  Other nonspecific abnormal serum enzyme levels   . Pain in joint, lower leg   . Pain in joint, pelvic region and thigh   . Pain in joint, shoulder region   . Palpitations   . Reflux esophagitis   . Type II or unspecified type diabetes mellitus without mention of complication, not stated as uncontrolled   . Unspecified essential hypertension   . Unspecified vitamin D deficiency    Past Surgical History:  Procedure Laterality  Date  . ABDOMINAL HYSTERECTOMY  1981  . BACK SURGERY  2003   L4-L5  . BREAST LUMPECTOMY Left 1994   lymph cancer   . BREAST SURGERY  1982   cyst removed, left  . CATARACT EXTRACTION, BILATERAL  2016   [facility]  . CHOLECYSTECTOMY  1987  . DILATION AND CURETTAGE OF UTERUS  1980  . LUMBAR SPINE SURGERY  2002  . TOTAL HIP ARTHROPLASTY  2003   bilateral   Social History:   reports that she has never smoked. She has never used smokeless tobacco. She reports that she does not drink alcohol or use drugs.  Family History  Problem Relation Age of Onset  . Heart disease Mother   . Cancer Father   . Diabetes Brother   . Cancer Brother     Medications: Patient's Medications  New Prescriptions   No medications on file  Previous Medications   AMLODIPINE (NORVASC) 5 MG TABLET    TAKE 1 TABLET BY MOUTH EVERY DAY   ASPIRIN 81 MG TABLET    Take 81 mg by mouth daily.   BUTALBITAL-ACETAMINOPHEN-CAFFEINE (FIORICET) 50-325-40 MG TABLET    Take one or 2 tablets every 6 hours if needed for headache   CHOLECALCIFEROL (VITAMIN D) 1000 UNITS TABLET    Take two tablets once daily   CYCLOBENZAPRINE (FLEXERIL) 10 MG TABLET    One up to 3 times daily as needed to help muscle spasm and pain   ENALAPRIL (VASOTEC) 20 MG TABLET    TAKE 1 TABLET BY MOUTH TWICE DAILY FOR BLOOD PRESSURE   HYDROCHLOROTHIAZIDE (HYDRODIURIL) 25 MG TABLET    TAKE 1 TABLET BY MOUTH EVERY DAY FOR SWELLING   HYDROCODONE-ACETAMINOPHEN (NORCO) 10-325 MG TABLET    Take one tablet by mouth up to four times a day as needed for pain.   LOPERAMIDE (IMODIUM A-D) 2 MG TABLET    Take one tablet by mouth with each loose stool up to 8 tablets in 24 hours   LORAZEPAM (ATIVAN) 1 MG TABLET    Take one tablet by mouth every 4 hours as needed for anxiety   METOPROLOL SUCCINATE (TOPROL-XL) 50 MG 24 HR TABLET    TAKE 1 TABLET BY MOUTH TWICE DAILY FOR BLOOD PRESSURE   OMEPRAZOLE (PRILOSEC) 20 MG CAPSULE    20 mg. Take one tablet at bedtime to reduce stomach  acid and to help heartburn.  Modified Medications   No medications on file  Discontinued Medications   HYDROCODONE-ACETAMINOPHEN (NORCO) 10-325 MG TABLET    Take one tablet by mouth up to four times a day as needed for pain.     Physical Exam:  Vitals:   07/19/16 1430  BP: 118/68  Pulse: 76  Resp: 18  Temp: 97.6 F (36.4 C)  TempSrc: Oral  SpO2: 93%  Weight: 141 lb (64 kg)  Height: 5\' 6"  (1.676 m)   Body mass index is 22.76 kg/m.  Physical Exam  Constitutional: She is oriented to person, place, and time. She appears well-developed  and well-nourished. No distress.  HENT:  Head: Normocephalic and atraumatic.  Eyes: Pupils are equal, round, and reactive to light.  Neck: Normal range of motion. Neck supple.  Cardiovascular: Normal rate, regular rhythm and normal heart sounds.   Pulmonary/Chest: Effort normal and breath sounds normal.  Abdominal: Bowel sounds are normal. She exhibits mass. There is tenderness in the right lower quadrant.  Firm mass that is tender upon palpation (~12 cm diameter).   Neurological: She is alert and oriented to person, place, and time.  Skin: Skin is warm and dry. She is not diaphoretic.  Psychiatric: She has a normal mood and affect. Her behavior is normal. Judgment and thought content normal.    Labs reviewed: Basic Metabolic Panel:  Recent Labs  07/27/15 0935 01/18/16 0921 05/25/16 1615  NA 143 139 140  K 3.9 3.8 4.0  CL 103 104 105  CO2 26 24 23   GLUCOSE 100* 107* 110*  BUN 18 18 17   CREATININE 0.87 1.02* 0.97*  CALCIUM 9.6 9.5 9.8  TSH  --   --  2.51   Liver Function Tests:  Recent Labs  07/27/15 0935 05/25/16 1615  AST 29 22  ALT 29 10  ALKPHOS 50 48  BILITOT 0.7 0.8  PROT 6.5 6.5  ALBUMIN 4.1 3.5*   No results for input(s): LIPASE, AMYLASE in the last 8760 hours. No results for input(s): AMMONIA in the last 8760 hours. CBC:  Recent Labs  05/25/16 1615  WBC 5.9  NEUTROABS 3,422  HGB 13.4  HCT 39.8  MCV  92.3  PLT 302   Lipid Panel:  Recent Labs  07/27/15 0935 01/18/16 0921  CHOL 196 183  HDL 45 41*  LDLCALC 119* 116  TRIG 158* 131  CHOLHDL 4.4 4.5   TSH:  Recent Labs  05/25/16 1615  TSH 2.51   A1C: Lab Results  Component Value Date   HGBA1C 6.0 (H) 01/18/2016     Assessment/Plan 1. Right lower quadrant abdominal mass - Mass in the RLQ ~12 cm diameter with 12 lb weight loss in one month 30 lb in 1 year Also with new onset urgency and lower abdominal pressure when urinating. - CT ABDOMEN PELVIS W WO CONTRAST to evaluate abdominal mass - POC Urinalysis Dipstick due to tenderness and urinary symptoms.   2. Acute bronchitis, unspecified organism - Self-limiting. Reassurance provided. Symptoms have almost completely resolved.  - Tylenol 325 mg (2 tablets) every 6 hours as needed for pain/fever. - Increase fluid intake and good nutrition.  - Notify if symptoms worsen or fail to improve further.   3. Abnormal urine -leukocytes in urine, will send for culture, hydration encouraged - Culture, Urine   Rayleigh Gillyard K. Harle Battiest  [facility] [phone] 8 am - 5 pm) [phone] (after hours)

## 2016-07-21 LAB — URINE CULTURE: Colony Count: >=100000

## 2016-07-22 ENCOUNTER — Other Ambulatory Visit: Payer: Medicare Other

## 2016-07-22 ENCOUNTER — Telehealth: Payer: Self-pay

## 2016-07-22 MED ORDER — NITROFURANTOIN MONOHYD MACRO 100 MG PO CAPS: 100.0000 mg | ORAL_CAPSULE | Freq: Two times a day (BID) | ORAL | 0 refills | Status: DC

## 2016-07-22 NOTE — Telephone Encounter (Signed)
    Pt with UTI, please have her start nitrofurantoin 100 mg by mouth twice daily for 7 days #14/0 refills to pharmacy of choice, increase hydration    Per Sherrie Mustache.  Refill sent to pharmacy.

## 2016-07-26 ENCOUNTER — Ambulatory Visit: Payer: Medicare Other | Admitting: Internal Medicine

## 2016-07-27 NOTE — Addendum Note (Signed)
Addended by: Rafael Bihari A on: 07/27/2016 01:08 PM   Modules accepted: Orders

## 2016-08-01 ENCOUNTER — Ambulatory Visit
Admission: RE | Admit: 2016-08-01 | Discharge: 2016-08-01 | Disposition: A | Discharge status: Home / Self Care | Payer: Medicare HMO | Source: Ambulatory Visit | Attending: Nurse Practitioner | Admitting: Nurse Practitioner

## 2016-08-01 DIAGNOSIS — R1903 Right lower quadrant abdominal swelling, mass and lump: Secondary | ICD-10-CM | POA: Diagnosis not present

## 2016-08-01 MED ORDER — IOPAMIDOL (ISOVUE-300) INJECTION 61%
100.0000 mL | Freq: Once | INTRAVENOUS | Status: AC | PRN
  Administered 2016-08-01: 100 mL via INTRAVENOUS

## 2016-08-02 ENCOUNTER — Ambulatory Visit (INDEPENDENT_AMBULATORY_CARE_PROVIDER_SITE_OTHER): Payer: Medicare HMO

## 2016-08-02 VITALS — BP 138/62 | HR 77 | Temp 98.3°F | Ht 66.0 in | Wt 147.8 lb

## 2016-08-02 DIAGNOSIS — Z0000 Encounter for general adult medical examination without abnormal findings: Secondary | ICD-10-CM

## 2016-08-02 NOTE — Progress Notes (Signed)
Subjective:   Tamara Yang is a 83 y.o. female who presents for an Initial Medicare Annual Wellness Visit.  Review of Systems     Cardiac Risk Factors include: advanced age (>35men, >25 women);family history of premature cardiovascular disease;hypertension     Objective:    Today's Vitals   08/02/16 1028  BP: 138/62  Pulse: 77  Temp: 98.3 F (36.8 C)  TempSrc: Oral  SpO2: 98%  Weight: 147 lb 12.8 oz (67 kg)  Height: 5\' 6"  (1.676 m)   Body mass index is 23.86 kg/m.   Current Medications (verified) Outpatient Encounter Prescriptions as of 08/02/2016  Medication Sig  . amLODipine (NORVASC) 5 MG tablet TAKE 1 TABLET BY MOUTH EVERY DAY  . aspirin 81 MG tablet Take 81 mg by mouth daily.  . butalbital-acetaminophen-caffeine (FIORICET) 50-325-40 MG tablet Take one or 2 tablets every 6 hours if needed for headache  . cholecalciferol (VITAMIN D) 1000 UNITS tablet Take two tablets once daily  . cyclobenzaprine (FLEXERIL) 10 MG tablet One up to 3 times daily as needed to help muscle spasm and pain  . enalapril (VASOTEC) 20 MG tablet TAKE 1 TABLET BY MOUTH TWICE DAILY FOR BLOOD PRESSURE  . hydrochlorothiazide (HYDRODIURIL) 25 MG tablet TAKE 1 TABLET BY MOUTH EVERY DAY FOR SWELLING  . HYDROcodone-acetaminophen (NORCO) 10-325 MG tablet Take one tablet by mouth up to four times a day as needed for pain.  Marland Kitchen loperamide (IMODIUM A-D) 2 MG tablet Take one tablet by mouth with each loose stool up to 8 tablets in 24 hours  . LORazepam (ATIVAN) 1 MG tablet Take one tablet by mouth every 4 hours as needed for anxiety  . metoprolol succinate (TOPROL-XL) 50 MG 24 hr tablet TAKE 1 TABLET BY MOUTH TWICE DAILY FOR BLOOD PRESSURE  . omeprazole (PRILOSEC) 20 MG capsule 20 mg. Take one tablet at bedtime to reduce stomach acid and to help heartburn.  . [DISCONTINUED] nitrofurantoin, macrocrystal-monohydrate, (MACROBID) 100 MG capsule Take 1 capsule (100 mg total) by mouth 2 (two) times daily.   No  facility-administered encounter medications on file as of 08/02/2016.     Allergies (verified) Daypro [oxaprozin]; Erythromycin; and Talwin [pentazocine]   History: Past Medical History:  Diagnosis Date  . Anxiety state, unspecified   . Carpal tunnel syndrome   . Degeneration of intervertebral disc, site unspecified   . Depressive disorder, not elsewhere classified   . Dysphagia, unspecified(787.20)   . Edema   . Headache(784.0)   . Insomnia, unspecified   . Lumbago   . Malignant neoplasm of breast (female), unspecified site   . Nonspecific elevation of levels of transaminase or lactic acid dehydrogenase (LDH)   . Osteoarthrosis, unspecified whether generalized or localized, unspecified site   . Otalgia, unspecified   . Other and unspecified hyperlipidemia   . Other chronic nonalcoholic liver disease   . Other malaise and fatigue   . Other malaise and fatigue   . Other nonspecific abnormal serum enzyme levels   . Pain in joint, lower leg   . Pain in joint, pelvic region and thigh   . Pain in joint, shoulder region   . Palpitations   . Reflux esophagitis   . Type II or unspecified type diabetes mellitus without mention of complication, not stated as uncontrolled   . Unspecified essential hypertension   . Unspecified vitamin D deficiency    Past Surgical History:  Procedure Laterality Date  . ABDOMINAL HYSTERECTOMY  1981  . BACK SURGERY  2003  L4-L5  . BREAST LUMPECTOMY Left 1994   lymph cancer   . BREAST SURGERY  1982   cyst removed, left  . CATARACT EXTRACTION, BILATERAL  2016   [place]  . CHOLECYSTECTOMY  1987  . DILATION AND CURETTAGE OF UTERUS  1980  . LUMBAR SPINE SURGERY  2002  . TOTAL HIP ARTHROPLASTY  2003   bilateral   Family History  Problem Relation Age of Onset  . Heart disease Mother   . Cancer Father   . Diabetes Brother   . Cancer Brother    Social History   Occupational History  . retired     Social History Main Topics  . Smoking status:  Never Smoker  . Smokeless tobacco: Never Used  . Alcohol use No  . Drug use: No  . Sexual activity: Not Currently    Tobacco Counseling Counseling given: No   Activities of Daily Living In your present state of health, do you have any difficulty performing the following activities: 08/02/2016 01/20/2016  Hearing? Tempie Donning  Vision? N N  Difficulty concentrating or making decisions? N N  Walking or climbing stairs? N N  Dressing or bathing? N N  Doing errands, shopping? N N  Preparing Food and eating ? N -  Using the Toilet? N -  In the past six months, have you accidently leaked urine? Y -  Do you have problems with loss of bowel control? N -  Managing your Medications? N -  Managing your Finances? N -  Housekeeping or managing your Housekeeping? N -  Some recent data might be hidden    Immunizations and Health Maintenance Immunization History  Administered Date(s) Administered  . Influenza,inj,Quad PF,36+ Mos 07/29/2015  . Pneumococcal Polysaccharide-23 02/26/2005  . Td 02/26/2005   Health Maintenance Due  Topic Date Due  . HEMOGLOBIN A1C  07/20/2016  . FOOT EXAM  07/28/2016    Patient Care Team: Estill Dooms, MD as PCP - General (Internal Medicine) Yehuda Mao, MD as Attending Physician (Rheumatology) Frederik Pear, MD as Consulting Physician (Orthopedic Surgery) Crista Luria, MD as Consulting Physician (Dermatology) Gery Pray, MD as Consulting Physician (Radiation Oncology) Mosie Epstein, CRNA as Referring Physician (Anesthesiology) Clent Jacks, MD as Consulting Physician (Ophthalmology)  Indicate any recent Medical Services you may have received from other than [facility] providers in the past year (date may be approximate).     Assessment:   This is a routine wellness examination for [place].  Hearing/Vision screen Hearing Screening Comments: Never had a hearing screen. Failed office hearing screen. Pt states she has had hearing loss but does not want pursue  anything at this time.  Vision Screening Comments: Last Eye exam with Dr. [name] in summer 2017. Hx of cataracts.  Dietary issues and exercise activities discussed: Current Exercise Habits: The patient does not participate in regular exercise at present, Exercise limited by: None identified  Goals    . <enter goal here>          Starting 08/02/16, I will maintain my current lifestyle.       Depression Screen PHQ 2/9 Scores 08/02/2016 01/20/2016 07/29/2015 01/07/2015 05/07/2014 07/10/2013 11/21/2012  PHQ - 2 Score 0 1 0 0 0 0 0    Fall Risk Fall Risk  08/02/2016 07/19/2016 01/20/2016 07/29/2015 01/07/2015  Falls in the past year? No No No No No    Cognitive Function: MMSE - Mini Mental State Exam 08/02/2016  Orientation to time 5  Orientation to Place 5  Registration  3  Attention/ Calculation 5  Recall 3  Language- name 2 objects 2  Language- repeat 1  Language- follow 3 step command 3  Language- read & follow direction 1  Write a sentence 1  Copy design 1  Total score 30        Screening Tests Health Maintenance  Topic Date Due  . HEMOGLOBIN A1C  07/20/2016  . FOOT EXAM  07/28/2016  . INFLUENZA VACCINE  08/04/2017 (Originally 02/02/2016)  . PNA vac Low Risk Adult (2 of 2 - PCV13) 08/04/2017 (Originally 02/26/2006)  . TETANUS/TDAP  07/04/2018 (Originally 02/27/2015)  . ZOSTAVAX  08/04/2018 (Originally 06/20/1995)  . DEXA SCAN  07/05/2023 (Originally 06/19/2000)  . OPHTHALMOLOGY EXAM  01/28/2017      Plan:    I have personally reviewed and addressed the Medicare Annual Wellness questionnaire and have noted the following in the patient's chart:  A. Medical and social history B. Use of alcohol, tobacco or illicit drugs  C. Current medications and supplements D. Functional ability and status E.  Nutritional status F.  Physical activity G. Advance directives H. List of other physicians I.  Hospitalizations, surgeries, and ER visits in previous 12 months J.  [place]  to include hearing, vision, cognitive, depression L. Referrals and appointments - none  In addition, I have reviewed and discussed with patient certain preventive protocols, quality metrics, and best practice recommendations. A written personalized care plan for preventive services as well as general preventive health recommendations were provided to patient.  See attached scanned questionnaire for additional information.   Signed,   Allyn Kenner, LPN Health Advisor   I have reviewed the information entered by the Health Advisor. I was present in the office during the time of patient interaction and was available for consultation. I agree with the documentation and advice.  Viviann Spare Nyoka Cowden, MD

## 2016-08-02 NOTE — Patient Instructions (Addendum)
Tamara Yang , Thank you for taking time to come for your Medicare Wellness Visit. I appreciate your ongoing commitment to your health goals. Please review the following plan we discussed and let me know if I can assist you in the future.   These are the goals we discussed: Goals    . <enter goal here>          Starting 08/02/16, I will maintain my current lifestyle.        This is a list of the screening recommended for you and due dates:  Health Maintenance  Topic Date Due  . Hemoglobin A1C  07/20/2016  . Complete foot exam   07/28/2016  . Flu Shot  08/04/2017*  . Pneumonia vaccines (2 of 2 - PCV13) 08/04/2017*  . Tetanus Vaccine  07/04/2018*  . Shingles Vaccine  08/04/2018*  . DEXA scan (bone density measurement)  07/05/2023*  . Eye exam for diabetics  01/28/2017  *Topic was postponed. The date shown is not the original due date.  Preventive Care for Adults  A healthy lifestyle and preventive care can promote health and wellness. Preventive health guidelines for adults include the following key practices.  . A routine yearly physical is a good way to check with your health care provider about your health and preventive screening. It is a chance to share any concerns and updates on your health and to receive a thorough exam.  . Visit your dentist for a routine exam and preventive care every 6 months. Brush your teeth twice a day and floss once a day. Good oral hygiene prevents tooth decay and gum disease.  . The frequency of eye exams is based on your age, health, family medical history, use  of contact lenses, and other factors. Follow your health care provider's ecommendations for frequency of eye exams.  . Eat a healthy diet. Foods like vegetables, fruits, whole grains, low-fat dairy products, and lean protein foods contain the nutrients you need without too many calories. Decrease your intake of foods high in solid fats, added sugars, and salt. Eat the right amount of calories  for you. Get information about a proper diet from your health care provider, if necessary.  . Regular physical exercise is one of the most important things you can do for your health. Most adults should get at least 150 minutes of moderate-intensity exercise (any activity that increases your heart rate and causes you to sweat) each week. In addition, most adults need muscle-strengthening exercises on 2 or more days a week.  Silver Sneakers may be a benefit available to you. To determine eligibility, you may visit the website: www.silversneakers.com or contact program at 7167996259 Mon-Fri between 8AM-8PM.   . Maintain a healthy weight. The body mass index (BMI) is a screening tool to identify possible weight problems. It provides an estimate of body fat based on height and weight. Your health care provider can find your BMI and can help you achieve or maintain a healthy weight.   For adults 20 years and older: ? A BMI below 18.5 is considered underweight. ? A BMI of 18.5 to 24.9 is normal. ? A BMI of 25 to 29.9 is considered overweight. ? A BMI of 30 and above is considered obese.   . Maintain normal blood lipids and cholesterol levels by exercising and minimizing your intake of saturated fat. Eat a balanced diet with plenty of fruit and vegetables. Blood tests for lipids and cholesterol should begin at age 25 and be repeated  every 5 years. If your lipid or cholesterol levels are high, you are over 50, or you are at high risk for heart disease, you may need your cholesterol levels checked more frequently. Ongoing high lipid and cholesterol levels should be treated with medicines if diet and exercise are not working.  . If you smoke, find out from your health care provider how to quit. If you do not use tobacco, please do not start.  . If you choose to drink alcohol, please do not consume more than 2 drinks per day. One drink is considered to be 12 ounces (355 mL) of beer, 5 ounces (148 mL) of  wine, or 1.5 ounces (44 mL) of liquor.  . If you are 31-108 years old, ask your health care provider if you should take aspirin to prevent strokes.  . Use sunscreen. Apply sunscreen liberally and repeatedly throughout the day. You should seek shade when your shadow is shorter than you. Protect yourself by wearing long sleeves, pants, a wide-brimmed hat, and sunglasses year round, whenever you are outdoors.  . Once a month, do a whole body skin exam, using a mirror to look at the skin on your back. Tell your health care provider of new moles, moles that have irregular borders, moles that are larger than a pencil eraser, or moles that have changed in shape or color.

## 2016-08-02 NOTE — Progress Notes (Signed)
Quick Notes   Health Maintenance:  Pt refused Pn13 and Flu.     Abnormal Screen: None; MMSE-30/30 Passed Clock test    Patient Concerns:  Pt had a CT scan done 08/01/16 for a knot in right lower stomach, she is anxious about getting the results back. She also needs a refill on her Norco.     Nurse Concerns:  None  I have reviewed the information entered by the Health Advisor. I was present in the office during the time of patient interaction and was available for consultation. I agree with the documentation and advice.  Viviann Spare Nyoka Cowden, MD

## 2016-08-03 ENCOUNTER — Other Ambulatory Visit: Payer: Self-pay | Admitting: *Deleted

## 2016-08-03 MED ORDER — HYDROCODONE-ACETAMINOPHEN 10-325 MG PO TABS: ORAL_TABLET | ORAL | 0 refills | Status: DC

## 2016-08-03 NOTE — Telephone Encounter (Signed)
Patient requested and will pick up 

## 2016-08-04 ENCOUNTER — Other Ambulatory Visit: Payer: Self-pay | Admitting: Internal Medicine

## 2016-08-04 ENCOUNTER — Telehealth: Payer: Self-pay | Admitting: Internal Medicine

## 2016-08-04 DIAGNOSIS — R1909 Other intra-abdominal and pelvic swelling, mass and lump: Secondary | ICD-10-CM

## 2016-08-04 DIAGNOSIS — R1900 Intra-abdominal and pelvic swelling, mass and lump, unspecified site: Secondary | ICD-10-CM | POA: Insufficient documentation

## 2016-08-04 NOTE — Telephone Encounter (Signed)
Reported abnormal CT of the abd results. Complex cystic and solid mass in the lower abd suspicious for ovarian cystadenocarcinoma. Recommend GYN consult. She accepts. Prior patient of Olena Mater. No current GYN.

## 2016-08-15 ENCOUNTER — Ambulatory Visit (INDEPENDENT_AMBULATORY_CARE_PROVIDER_SITE_OTHER): Payer: Medicare HMO | Admitting: Gynecology

## 2016-08-15 ENCOUNTER — Encounter: Payer: Self-pay | Admitting: Gynecology

## 2016-08-15 VITALS — BP 148/80 | Ht 66.0 in | Wt 148.0 lb

## 2016-08-15 DIAGNOSIS — R1900 Intra-abdominal and pelvic swelling, mass and lump, unspecified site: Secondary | ICD-10-CM | POA: Diagnosis not present

## 2016-08-15 DIAGNOSIS — K921 Melena: Secondary | ICD-10-CM

## 2016-08-15 NOTE — Patient Instructions (Addendum)
CA-125 Tumor Marker Test Why am I having this test? This test is used to check the level of cancer antigen 125 (CA-125) in your blood. The CA-125 tumor marker test can be helpful in detecting ovarian cancer. The test is only performed if you are considered at high risk for ovarian cancer. Your health care provider may recommend this test if:  You have a strong family history of ovarian cancer.  You have a breast cancer antigen (BRCA) genetic defect. If you have already been diagnosed with ovarian cancer, your health care provider may use this test to help identify the extent of the disease and to monitor your response to treatment. What kind of sample is taken? A blood sample is required for this test. It is usually collected by inserting a needle into a vein. How do I prepare for this test? There is no preparation required for this test. What are the reference ranges? Reference ranges are considered healthy ranges established after testing a large group of healthy people. Reference ranges may vary among different people, labs, and hospitals. It is your responsibility to obtain your test results. Ask the lab or department performing the test when and how you will get your results. The reference range for this test is 0-35 units/mL or less than 35 kunits/L (SI units). What do the results mean? Increased levels of CA-125 may indicate:  Certain types of cancer, including:  Ovarian cancer.  Pancreatic cancer.  Colon cancer.  Lung cancer.  Breast cancer.  Lymphoma.  Noncancerous (benign) disorders, including:  Cirrhosis.  Pregnancy.  Endometriosis.  Pancreatitis.  Pelvic inflammatory disease (PID). Talk with your health care provider to discuss your results, treatment options, and if necessary, the need for more tests. Talk with your health care provider if you have any questions about your results. Talk with your health care provider to discuss your results, treatment options,  and if necessary, the need for more tests. Talk with your health care provider if you have any questions about your results. This information is not intended to replace advice given to you by your health care provider. Make sure you discuss any questions you have with your health care provider. Document Released: 07/12/2004 Document Revised: 02/23/2016 Document Reviewed: 11/07/2013 Elsevier Interactive Patient Education  2017 [place]. Ovarian Cyst  An ovarian cyst is a fluid-filled sac that forms on an ovary. The ovaries are small organs that produce eggs in women. Various types of cysts can form on the ovaries. Some may cause symptoms and require treatment. Most ovarian cysts go away on their own, are not cancerous (are benign), and do not cause problems. Common types of ovarian cysts include:  Functional (follicle) cysts.  Occur during the menstrual cycle, and usually go away with the next menstrual cycle if you do not get pregnant.  Usually cause no symptoms.  Endometriomas.  Are cysts that form from the tissue that lines the uterus (endometrium).  Are sometimes called "chocolate cysts" because they become filled with blood that turns brown.  Can cause pain in the lower abdomen during intercourse and during your period.  Cystadenoma cysts.  Develop from cells on the outside surface of the ovary.  Can get very large and cause lower abdomen pain and pain with intercourse.  Can cause severe pain if they twist or break open (rupture).  Dermoid cysts.  Are sometimes found in both ovaries.  May contain different kinds of body tissue, such as skin, teeth, hair, or cartilage.  Usually do not  cause symptoms unless they get very big.  Theca lutein cysts.  Occur when too much of a certain hormone (human chorionic gonadotropin) is produced and overstimulates the ovaries to produce an egg.  Are most common after having procedures used to assist with the conception of a baby (in  vitro fertilization). What are the causes? Ovarian cysts may be caused by:  Ovarian hyperstimulation syndrome. This is a condition that can develop from taking fertility medicines. It causes multiple large ovarian cysts to form.  Polycystic ovarian syndrome (PCOS). This is a common hormonal disorder that can cause ovarian cysts, as well as problems with your period or fertility. What increases the risk? The following factors may make you more likely to develop ovarian cysts:  Being overweight or obese.  Taking fertility medicines.  Taking certain forms of hormonal birth control.  Smoking. What are the signs or symptoms? Many ovarian cysts do not cause symptoms. If symptoms are present, they may include:  Pelvic pain or pressure.  Pain in the lower abdomen.  Pain during sex.  Abdominal swelling.  Abnormal menstrual periods.  Increasing pain with menstrual periods. How is this diagnosed? These cysts are commonly found during a routine pelvic exam. You may have tests to find out more about the cyst, such as:  Ultrasound.  X-ray of the pelvis.  CT scan.  MRI.  Blood tests. How is this treated? Many ovarian cysts go away on their own without treatment. Your health care provider may want to check your cyst regularly for 2-3 months to see if it changes. If you are in menopause, it is especially important to have your cyst monitored closely because menopausal women have a higher rate of ovarian cancer. When treatment is needed, it may include:  Medicines to help relieve pain.  A procedure to drain the cyst (aspiration).  Surgery to remove the whole cyst.  Hormone treatment or birth control pills. These methods are sometimes used to help dissolve a cyst. Follow these instructions at home:  Take over-the-counter and prescription medicines only as told by your health care provider.  Do not drive or use heavy machinery while taking prescription pain medicine.  Get  regular pelvic exams and Pap tests as often as told by your health care provider.  Return to your normal activities as told by your health care provider. Ask your health care provider what activities are safe for you.  Do not use any products that contain nicotine or tobacco, such as cigarettes and e-cigarettes. If you need help quitting, ask your health care provider.  Keep all follow-up visits as told by your health care provider. This is important. Contact a health care provider if:  Your periods are late, irregular, or painful, or they stop.  You have pelvic pain that does not go away.  You have pressure on your bladder or trouble emptying your bladder completely.  You have pain during sex.  You have any of the following in your abdomen:  A feeling of fullness.  Pressure.  Discomfort.  Pain that does not go away.  Swelling.  You feel generally ill.  You become constipated.  You lose your appetite.  You develop severe acne.  You start to have more body hair and facial hair.  You are gaining weight or losing weight without changing your exercise and eating habits.  You think you may be pregnant. Get help right away if:  You have abdominal pain that is severe or gets worse.  You cannot eat  or drink without vomiting.  You suddenly develop a fever.  Your menstrual period is much heavier than usual. This information is not intended to replace advice given to you by your health care provider. Make sure you discuss any questions you have with your health care provider. Document Released: 06/20/2005 Document Revised: 01/08/2016 Document Reviewed: 11/22/2015 Elsevier Interactive Patient Education  2017 [employer].

## 2016-08-15 NOTE — Addendum Note (Signed)
Addended by: Burnett Kanaris on: 08/15/2016 03:56 PM   Modules accepted: Orders

## 2016-08-15 NOTE — Progress Notes (Signed)
   Patient is a 83 year old who was referred to our practice as a courtesy of Dr. Jani Gravel her internist as a result of patient's abdominal pelvic mass. She had ordered a CT scan because of patient's low abdominal discomfort which started back in November at which time she had had 1 episode of vomiting and has had loose stools on and off since then. Several years ago another practitioner had done a total abdominal hysterectomy with left salpingo-oophorectomy for benign pathology. We have no records. Patient denied any prior history of any abnormal Pap smears. She has never had a colonoscopy. Patient denies any GU complaints.  Exam: Back: No CVA tenderness Abdomen abdominal pelvic mass palpable from midline to right adnexa and mobile nontender proximally 16 week size Pelvic: Bartholin urethra Skene was within normal limits atrophic changes Vagina: Atrophic changes vaginal cuff intact Rectovaginal exam: No masses palpated between the vagina and the rectum but pelvic mass mobile 16 cm nontender noted.  Fecal Hemoccult blood test in the office today was positive  CT scan report from 08/01/2016: IMPRESSION: 17 cm complex cystic and solid right pelvic and lower abdominal mass, highly suspicious for ovarian cystadenocarcinoma.  Soft tissue stranding in lower abdominal and pelvic omental fat. Although there is no evidence of ascites, early peritoneal carcinomatosis cannot be excluded.  Tiny right pleural effusion.  Assessment/plan: 83 year old patient with right adnexal mass 16 cm in size highly suspicious for ovarian cancer. A ROMA-1 ovarian cancer screening blood test will be ordered. Patient will be referred to GYN oncologist for further evaluation and staging. Of note fecal Hemoccult testing in the office was positive. I have sent a message to the GYN oncologist before this patient's consultation.

## 2016-08-16 ENCOUNTER — Telehealth: Payer: Self-pay | Admitting: *Deleted

## 2016-08-16 NOTE — Telephone Encounter (Signed)
-----   Message from Terrance Mass, MD sent at 08/15/2016  2:36 PM EST ----- Please make an appointment for this patient with Dr. Everitt Amber. Diagnosis pelvic mass, CT has been done ROMA-! Ordered today.

## 2016-08-16 NOTE — Telephone Encounter (Signed)
Appointment scheduled on 08/29/16 @ 10:15am at [facility], left message for pt to call.

## 2016-08-16 NOTE — Telephone Encounter (Signed)
Pt informed with time and date.  

## 2016-08-18 ENCOUNTER — Encounter: Payer: Self-pay | Admitting: Internal Medicine

## 2016-08-21 ENCOUNTER — Emergency Department (HOSPITAL_BASED_OUTPATIENT_CLINIC_OR_DEPARTMENT_OTHER): Payer: Medicare HMO

## 2016-08-21 ENCOUNTER — Encounter (HOSPITAL_BASED_OUTPATIENT_CLINIC_OR_DEPARTMENT_OTHER): Payer: Self-pay | Admitting: Emergency Medicine

## 2016-08-21 ENCOUNTER — Emergency Department (HOSPITAL_BASED_OUTPATIENT_CLINIC_OR_DEPARTMENT_OTHER)
Admission: EM | Admit: 2016-08-21 | Discharge: 2016-08-21 | Disposition: A | Discharge status: Home / Self Care | Payer: Medicare HMO | Attending: Emergency Medicine | Admitting: Emergency Medicine

## 2016-08-21 DIAGNOSIS — R609 Edema, unspecified: Secondary | ICD-10-CM | POA: Insufficient documentation

## 2016-08-21 DIAGNOSIS — Y999 Unspecified external cause status: Secondary | ICD-10-CM | POA: Insufficient documentation

## 2016-08-21 DIAGNOSIS — E119 Type 2 diabetes mellitus without complications: Secondary | ICD-10-CM | POA: Diagnosis not present

## 2016-08-21 DIAGNOSIS — S0990XA Unspecified injury of head, initial encounter: Secondary | ICD-10-CM | POA: Diagnosis present

## 2016-08-21 DIAGNOSIS — M25552 Pain in left hip: Secondary | ICD-10-CM | POA: Insufficient documentation

## 2016-08-21 DIAGNOSIS — W108XXA Fall (on) (from) other stairs and steps, initial encounter: Secondary | ICD-10-CM | POA: Insufficient documentation

## 2016-08-21 DIAGNOSIS — Y9389 Activity, other specified: Secondary | ICD-10-CM | POA: Insufficient documentation

## 2016-08-21 DIAGNOSIS — S7002XA Contusion of left hip, initial encounter: Secondary | ICD-10-CM

## 2016-08-21 DIAGNOSIS — Y92009 Unspecified place in unspecified non-institutional (private) residence as the place of occurrence of the external cause: Secondary | ICD-10-CM | POA: Diagnosis not present

## 2016-08-21 DIAGNOSIS — S0101XA Laceration without foreign body of scalp, initial encounter: Secondary | ICD-10-CM | POA: Diagnosis not present

## 2016-08-21 DIAGNOSIS — Z23 Encounter for immunization: Secondary | ICD-10-CM | POA: Diagnosis not present

## 2016-08-21 DIAGNOSIS — Z853 Personal history of malignant neoplasm of breast: Secondary | ICD-10-CM | POA: Insufficient documentation

## 2016-08-21 DIAGNOSIS — Z7982 Long term (current) use of aspirin: Secondary | ICD-10-CM | POA: Insufficient documentation

## 2016-08-21 DIAGNOSIS — S0003XA Contusion of scalp, initial encounter: Secondary | ICD-10-CM

## 2016-08-21 DIAGNOSIS — R51 Headache: Secondary | ICD-10-CM | POA: Diagnosis not present

## 2016-08-21 DIAGNOSIS — Z79899 Other long term (current) drug therapy: Secondary | ICD-10-CM | POA: Insufficient documentation

## 2016-08-21 DIAGNOSIS — I10 Essential (primary) hypertension: Secondary | ICD-10-CM | POA: Insufficient documentation

## 2016-08-21 DIAGNOSIS — S79912A Unspecified injury of left hip, initial encounter: Secondary | ICD-10-CM | POA: Diagnosis not present

## 2016-08-21 LAB — OVARIAN MALIGNANCY RISK-ROMA
CA125: 78 U/mL — ABNORMAL HIGH (ref <35)
HE4: 111 pmol (ref <151)
ROMA POSTMENOPAUSAL: 4.99 — AB (ref <2.77)
ROMA PREMENOPAUSAL: 3.73 — AB (ref <1.31)

## 2016-08-21 MED ORDER — TETANUS-DIPHTH-ACELL PERTUSSIS 5-2.5-18.5 LF-MCG/0.5 IM SUSP
0.5000 mL | Freq: Once | INTRAMUSCULAR | Status: AC
  Administered 2016-08-21: 0.5 mL via INTRAMUSCULAR
  Filled 2016-08-21: qty 0.5

## 2016-08-21 NOTE — ED Triage Notes (Addendum)
[address] about 45 min ago, struck head on concrete floor. No LOC, pain to left hip. Lost  balance upon stepping up to step. ?superficial lac to left scalp, bleeding controlled.

## 2016-08-21 NOTE — ED Provider Notes (Signed)
[place] DEPT [facility] Provider Note   CSN: [id number] Arrival date & time: 08/21/16  1315     History   Chief Complaint Chief Complaint  Patient presents with  . Fall    HPI Tamara Yang is a 83 y.o. female.  Patient is an 83 year old female presenting today after a fall. Patient stepped up 1 step at her home and lost her balance falling backwards hitting her head on the cement. She denied any dizziness, lightheadedness or vertiginous symptoms prior to fall. She is having pain where she hit her head and left hip pain but denies any difficulty walking, chest or abdominal pain, visual changes or neck pain. She does not take anticoagulation. Pain is currently a 5 out of 10 sharp in nature.   The history is provided by the patient.  Fall  This is a new problem. The current episode started 1 to 2 hours ago. The problem occurs constantly. The problem has been resolved. Associated symptoms include headaches. Associated symptoms comments: Left hip pain.  No LOC and no trouble walking.  No dizziness or SOB.  NO chest or abd pain.  .    Past Medical History:  Diagnosis Date  . Anxiety state, unspecified   . Carpal tunnel syndrome   . Degeneration of intervertebral disc, site unspecified   . Depressive disorder, not elsewhere classified   . Dysphagia, unspecified(787.20)   . Edema   . Headache(784.0)   . Insomnia, unspecified   . Lumbago   . Malignant neoplasm of breast (female), unspecified site   . Nonspecific elevation of levels of transaminase or lactic acid dehydrogenase (LDH)   . Osteoarthrosis, unspecified whether generalized or localized, unspecified site   . Otalgia, unspecified   . Other and unspecified hyperlipidemia   . Other chronic nonalcoholic liver disease   . Other malaise and fatigue   . Other malaise and fatigue   . Other nonspecific abnormal serum enzyme levels   . Pain in joint, lower leg   . Pain in joint, pelvic region and thigh   . Pain in joint,  shoulder region   . Palpitations   . Reflux esophagitis   . Type II or unspecified type diabetes mellitus without mention of complication, not stated as uncontrolled   . Unspecified essential hypertension   . Unspecified vitamin D deficiency     Patient Active Problem List   Diagnosis Date Noted  . Abdominal mass 08/04/2016  . Diarrhea 05/25/2016  . Blood in stool 05/25/2016  . Loss of weight 05/25/2016  . Headache 01/20/2016  . Other malaise and fatigue 11/12/2013  . Unspecified vitamin D deficiency 11/21/2012  . Pain in joint, shoulder region   . Pain in joint, lower leg   . Insomnia, unspecified   . DM type 2 (diabetes mellitus, type 2) ([place])   . Hyperlipidemia   . Essential hypertension   . Nonalcoholic steatohepatitis (NA[facility])   . Liver enzyme elevation     Past Surgical History:  Procedure Laterality Date  . ABDOMINAL HYSTERECTOMY  1981  . BACK SURGERY  2003   L4-L5  . BREAST LUMPECTOMY Left 1994   lymph cancer   . BREAST SURGERY  1982   cyst removed, left  . CATARACT EXTRACTION, BILATERAL  2016   [facility]  . CHOLECYSTECTOMY  1987  . DILATION AND CURETTAGE OF UTERUS  1980  . LUMBAR SPINE SURGERY  2002  . TOTAL HIP ARTHROPLASTY  2003   bilateral    OB History  Gravida Para Term Preterm AB Living   3       0 3   SAB TAB Ectopic Multiple Live Births       0           Home Medications    Prior to Admission medications   Medication Sig Start Date End Date Taking? Authorizing Provider  amLODipine (NORVASC) 5 MG tablet TAKE 1 TABLET BY MOUTH EVERY DAY 05/12/16  Yes Estill Dooms, MD  aspirin 81 MG tablet Take 81 mg by mouth daily.   Yes Historical Provider, MD  butalbital-acetaminophen-caffeine (FIORICET) [phone] MG tablet Take one or 2 tablets every 6 hours if needed for headache 01/20/16  Yes Estill Dooms, MD  cholecalciferol (VITAMIN D) 1000 UNITS tablet Take two tablets once daily   Yes Historical Provider, MD  cyclobenzaprine (FLEXERIL) 10 MG tablet  One up to 3 times daily as needed to help muscle spasm and pain 01/21/15  Yes Estill Dooms, MD  enalapril (VASOTEC) 20 MG tablet TAKE 1 TABLET BY MOUTH TWICE DAILY FOR BLOOD PRESSURE 06/22/16  Yes Estill Dooms, MD  hydrochlorothiazide (HYDRODIURIL) 25 MG tablet TAKE 1 TABLET BY MOUTH EVERY DAY FOR SWELLING 05/23/16  Yes Estill Dooms, MD  HYDROcodone-acetaminophen (NORCO) 10-325 MG tablet Take one tablet by mouth up to four times a day as needed for pain. 08/03/16  Yes Gildardo Cranker, DO  loperamide (IMODIUM A-D) 2 MG tablet Take one tablet by mouth with each loose stool up to 8 tablets in 24 hours 05/10/16  Yes Estill Dooms, MD  LORazepam (ATIVAN) 1 MG tablet Take one tablet by mouth every 4 hours as needed for anxiety 05/25/16  Yes Estill Dooms, MD  metoprolol succinate (TOPROL-XL) 50 MG 24 hr tablet TAKE 1 TABLET BY MOUTH TWICE DAILY FOR BLOOD PRESSURE 05/16/16  Yes Estill Dooms, MD  omeprazole (PRILOSEC) 20 MG capsule 20 mg. Take one tablet at bedtime to reduce stomach acid and to help heartburn.   Yes Historical Provider, MD    Family History Family History  Problem Relation Age of Onset  . Heart disease Mother   . Cancer Father   . Diabetes Brother   . Cancer Brother     Social History Social History  Substance Use Topics  . Smoking status: Never Smoker  . Smokeless tobacco: Never Used  . Alcohol use No     Allergies   Daypro [oxaprozin]; Erythromycin; and Talwin [pentazocine]   Review of Systems Review of Systems  Musculoskeletal:       Patient has noticed swelling in the right ankle for the last week or more. She also has pain in her right pelvic area which is from an ovary that might have cancer.  Neurological: Positive for headaches.  All other systems reviewed and are negative.    Physical Exam Updated Vital Signs BP 146/67 (BP Location: Left Arm)   Pulse 66   Temp 97.9 F (36.6 C) (Oral)   Resp 18   Ht 5\' 6"  (1.676 m)   Wt 148 lb (67.1 kg)   SpO2  97%   BMI 23.89 kg/m   Physical Exam  Constitutional: She is oriented to person, place, and time. She appears well-developed and well-nourished. No distress.  HENT:  Head: Normocephalic. Head is with contusion and with laceration.    Mouth/Throat: Oropharynx is clear and moist.  Eyes: Conjunctivae and EOM are normal. Pupils are equal, round, and reactive to light.  Neck: Normal  range of motion. Neck supple. No spinous process tenderness and no muscular tenderness present.  Cardiovascular: Normal rate, regular rhythm and intact distal pulses.   No murmur heard. Pulmonary/Chest: Effort normal and breath sounds normal. No respiratory distress. She has no wheezes. She has no rales.  Abdominal: Soft. She exhibits no distension. There is no tenderness. There is no rebound and no guarding.  Musculoskeletal: Normal range of motion. She exhibits tenderness. She exhibits no edema.       Left hip: She exhibits tenderness and swelling. She exhibits normal range of motion, normal strength and no bony tenderness.       Legs: 1+ pitting edema and the right ankle. No calf tenderness  Neurological: She is alert and oriented to person, place, and time. She has normal strength. No cranial nerve deficit or sensory deficit.  Skin: Skin is warm and dry. No rash noted. No erythema.  Psychiatric: She has a normal mood and affect. Her behavior is normal.  Nursing note and vitals reviewed.    ED Treatments / Results  Labs (all labs ordered are listed, but only abnormal results are displayed) Labs Reviewed - No data to display  EKG  EKG Interpretation None       Radiology Ct Head Wo Contrast  Result Date: 08/21/2016 CLINICAL DATA:  Fall with scalp laceration.  Headache and dizziness. EXAM: CT HEAD WITHOUT CONTRAST TECHNIQUE: Contiguous axial images were obtained from the base of the skull through the vertex without intravenous contrast. COMPARISON:  None. FINDINGS: Brain: Normal for age. No evidence  of acute infarction, hemorrhage, hydrocephalus, extra-axial collection or mass lesion/mass effect. Vascular: No hyperdense vessel or unexpected calcification. Skull: Normal. Negative for fracture or focal lesion. Sinuses/Orbits: Bilateral cataract resection.  No acute finding. IMPRESSION: No evidence of intracranial injury or fracture. Electronically Signed   By: Monte Fantasia M.D.   On: 08/21/2016 14:28   Dg Hip Unilat W Or Wo Pelvis 2-3 Views Left  Result Date: 08/21/2016 CLINICAL DATA:  Pain following fall EXAM: DG HIP (WITH OR WITHOUT PELVIS) 2-3V LEFT COMPARISON:  None. FINDINGS: Frontal pelvis as well as frontal and lateral left hip images were obtained. Bones are osteoporotic. The patient has had total hip replacements bilaterally. Prosthetic components bilaterally appear well seated. There is no evident acute fracture or dislocation. No erosive change. IMPRESSION: Status post total hip replacements bilaterally with prosthetic components well-seated bilaterally. No acute fracture or dislocation. Bones diffusely osteoporotic. Electronically Signed   By: Lowella Grip III M.D.   On: 08/21/2016 14:48    Procedures Procedures (including critical care time)  Medications Ordered in ED Medications  Tdap (BOOSTRIX) injection 0.5 mL (not administered)   LACERATION REPAIR Performed by: Blanchie Dessert Authorized byBlanchie Dessert Consent: Verbal consent obtained. Risks and benefits: risks, benefits and alternatives were discussed Consent given by: patient Patient identity confirmed: provided demographic data Prepped and Draped in normal sterile fashion Wound explored  Laceration Location: left parietal scalp  Laceration Length: 2cm  No Foreign Bodies seen or palpated  Anesthesia:none Irrigation method: scrub Amount of cleaning: standard  Skin closure: dermabond  Patient tolerance: Patient tolerated the procedure well with no immediate complications.   Initial Impression  / Assessment and Plan / ED Course  I have reviewed the triage vital signs and the nursing notes.  Pertinent labs & imaging results that were available during my care of the patient were reviewed by me and considered in my medical decision making (see chart for details).    Patient with  a mechanical fall today with head injury and left hip pain. Patient was able to ambulate into the department without difficulty. She denies any LOC and has a normal neurologic exam. She is not on anticoagulation. Tetanus shot updated today. CT of the head and hip x-ray pending. Patient has no cervical tenderness and is neurovascularly intact.   Imaging neg and pt d/ced home.  Final Clinical Impressions(s) / ED Diagnoses   Final diagnoses:  Contusion of scalp, initial encounter  Laceration of scalp, initial encounter  Contusion of left hip, initial encounter    New Prescriptions New Prescriptions   No medications on file     Blanchie Dessert, MD 08/21/16 1458

## 2016-08-29 ENCOUNTER — Encounter: Payer: Self-pay | Admitting: Gynecologic Oncology

## 2016-08-29 ENCOUNTER — Telehealth: Payer: Self-pay | Admitting: Gynecologic Oncology

## 2016-08-29 ENCOUNTER — Telehealth: Payer: Self-pay

## 2016-08-29 ENCOUNTER — Other Ambulatory Visit (HOSPITAL_BASED_OUTPATIENT_CLINIC_OR_DEPARTMENT_OTHER): Payer: Medicare HMO

## 2016-08-29 ENCOUNTER — Ambulatory Visit: Payer: Medicare HMO | Attending: Gynecologic Oncology | Admitting: Gynecologic Oncology

## 2016-08-29 ENCOUNTER — Ambulatory Visit (HOSPITAL_COMMUNITY)
Admission: RE | Admit: 2016-08-29 | Discharge: 2016-08-29 | Disposition: A | Discharge status: Home / Self Care | Payer: Medicare HMO | Source: Ambulatory Visit | Attending: Gynecologic Oncology | Admitting: Gynecologic Oncology

## 2016-08-29 VITALS — BP 150/53 | HR 65 | Temp 98.1°F | Resp 18 | Ht 66.0 in | Wt 144.4 lb

## 2016-08-29 DIAGNOSIS — Z833 Family history of diabetes mellitus: Secondary | ICD-10-CM | POA: Insufficient documentation

## 2016-08-29 DIAGNOSIS — Z853 Personal history of malignant neoplasm of breast: Secondary | ICD-10-CM | POA: Diagnosis not present

## 2016-08-29 DIAGNOSIS — E559 Vitamin D deficiency, unspecified: Secondary | ICD-10-CM | POA: Insufficient documentation

## 2016-08-29 DIAGNOSIS — Z923 Personal history of irradiation: Secondary | ICD-10-CM | POA: Insufficient documentation

## 2016-08-29 DIAGNOSIS — Z803 Family history of malignant neoplasm of breast: Secondary | ICD-10-CM | POA: Insufficient documentation

## 2016-08-29 DIAGNOSIS — Z881 Allergy status to other antibiotic agents status: Secondary | ICD-10-CM | POA: Insufficient documentation

## 2016-08-29 DIAGNOSIS — I10 Essential (primary) hypertension: Secondary | ICD-10-CM | POA: Insufficient documentation

## 2016-08-29 DIAGNOSIS — N83209 Unspecified ovarian cyst, unspecified side: Secondary | ICD-10-CM | POA: Diagnosis not present

## 2016-08-29 DIAGNOSIS — R1900 Intra-abdominal and pelvic swelling, mass and lump, unspecified site: Secondary | ICD-10-CM

## 2016-08-29 DIAGNOSIS — M1990 Unspecified osteoarthritis, unspecified site: Secondary | ICD-10-CM | POA: Diagnosis not present

## 2016-08-29 DIAGNOSIS — R971 Elevated cancer antigen 125 [CA 125]: Secondary | ICD-10-CM | POA: Diagnosis not present

## 2016-08-29 DIAGNOSIS — E785 Hyperlipidemia, unspecified: Secondary | ICD-10-CM | POA: Insufficient documentation

## 2016-08-29 DIAGNOSIS — R195 Other fecal abnormalities: Secondary | ICD-10-CM

## 2016-08-29 DIAGNOSIS — R600 Localized edema: Secondary | ICD-10-CM | POA: Insufficient documentation

## 2016-08-29 DIAGNOSIS — Z888 Allergy status to other drugs, medicaments and biological substances status: Secondary | ICD-10-CM | POA: Insufficient documentation

## 2016-08-29 DIAGNOSIS — F329 Major depressive disorder, single episode, unspecified: Secondary | ICD-10-CM | POA: Insufficient documentation

## 2016-08-29 DIAGNOSIS — Z7982 Long term (current) use of aspirin: Secondary | ICD-10-CM | POA: Diagnosis not present

## 2016-08-29 DIAGNOSIS — R970 Elevated carcinoembryonic antigen [CEA]: Secondary | ICD-10-CM

## 2016-08-29 DIAGNOSIS — Z79899 Other long term (current) drug therapy: Secondary | ICD-10-CM | POA: Diagnosis not present

## 2016-08-29 DIAGNOSIS — I82811 Embolism and thrombosis of superficial veins of right lower extremities: Secondary | ICD-10-CM | POA: Diagnosis not present

## 2016-08-29 DIAGNOSIS — K769 Liver disease, unspecified: Secondary | ICD-10-CM | POA: Insufficient documentation

## 2016-08-29 DIAGNOSIS — Z90710 Acquired absence of both cervix and uterus: Secondary | ICD-10-CM | POA: Insufficient documentation

## 2016-08-29 DIAGNOSIS — R69 Illness, unspecified: Secondary | ICD-10-CM | POA: Diagnosis not present

## 2016-08-29 DIAGNOSIS — E119 Type 2 diabetes mellitus without complications: Secondary | ICD-10-CM | POA: Diagnosis not present

## 2016-08-29 DIAGNOSIS — Z8249 Family history of ischemic heart disease and other diseases of the circulatory system: Secondary | ICD-10-CM | POA: Insufficient documentation

## 2016-08-29 LAB — CEA (IN HOUSE-CHCC): CEA (CHCC-IN HOUSE): 172.52 ng/mL — AB (ref 0.00–5.00)

## 2016-08-29 NOTE — Telephone Encounter (Signed)
Called to inform patient of very elevated CEA (170+) and therefore concern for occult GI malignancy.  Recommend preoperative colonoscopy/EGD with GI.  Will schedule this for her.  Continue with plan to schedule surgery at [facility] for her for removal of adnexal mass pending these results.

## 2016-08-29 NOTE — Telephone Encounter (Signed)
-----   Message from Everitt Amber, MD sent at 08/29/2016  4:39 PM EST ----- No evidence of DVT on scan. No intervention necessary for the leg swelling.

## 2016-08-29 NOTE — Telephone Encounter (Signed)
LM for ms Vassar to notify her that the Vascular study of her right leg was negative.

## 2016-08-29 NOTE — Patient Instructions (Signed)
Today you will have Doppler study on your right lower leg, we will contact you with the results of that test. After completion of the doppler study you will need to return to the Lab at the [facility] and check in at registration desk. Our office will coordinate your surgical procedure of Exploratory Laparotomy with unilateral salpingo-oophorectomy possible BSO and possible staging. Katharine Look from [facility] will contact you about the surgical date and time. Your preoperative appointment will be at [place] will inform you about that appointment

## 2016-08-29 NOTE — Progress Notes (Addendum)
Consult Note: Gyn-Onc  Consult was requested by Dr. Toney Yang for the evaluation of Tamara Yang 83 y.o. female  CC:  Chief Complaint  Patient presents with  . Pelvic Mass    Assessment/Plan:  [name]  is a 83 y.o.  year old with a 17cm cystic and solid ovarian mass and elevated CA 125 (78) and ROMA score and positive fecal occult testing.  I performed a history, physical examination, and personally reviewed the patient's imaging films including the CT abdo/pelvis from 08/01/16.  It is unclear if this represents a malignancy or not (it is uniform, mostly cystic, and not associated with gross extra-ovarian disease). However, given its size, symptom profile and elevated tumor markers I recommend its surgical removal.   I am recommending exploratory laparotomy, unilateral (likely right) salpingo-oophorectomy, possible bilateral salpingo-oophorectomy, possible staging, possible bowel resection.   We will check CEA today given the positive fecal occult testing. Close attention will be made to the appendix intraoperatively given the right sided nature of this lesion.  Lower extremity dopplers today was negative for DVT.   I recommending that she discontinue her daily aspirin 81mg  prior to surgery as of today.  In order to expedite the surgical date, we have made arrangements for a surgery to be performed by one of my partners in [place].  I recommended she reach out to her orthopedic surgeon regarding her left hip pain since the fall to ensure her hip prosthesis is in the correct alignment.   HPI: Tamara Yang is an 82 year old woman (P3) who is seen in consultation at the request of Dr Tamara Yang for a 17cm right pelvic mass.   The patient has a remote history of stage I breast cancer (postmenopausal) in 1994 treated with surgery and radiation.  She experienced a sudden pain in her right side on 05/07/16 and was seen by her PCP. It was felt to be a GI issue due to the  associated symptoms of diarrhea. She has had no pain since that episode. However, her weight has dropped 4 lbs and she has some fatigue. She reports her appetite is good and she denies early satiety or bloating. In January, 2018 she noticed that there was a mass in her right lower abdomen which was apparent in supine positioning. She reported this to her PCP, Dr Tamara Yang, who performed a CT abdo/pelvis on 08/01/16 revealing: a tiny right pleural effusion, a 16.7x10.2x12.3cm cystic and solid mass in the right pelvis and lower abdomen. Surgically absent uterus, no ascites or adenopathy. There is mild soft tissue stranding in the lower abdominal pelvic omental fat but no discrete peritoneal metastases.   A ROMA score was drawn on 08/15/16 which was elevated (4.99) for a postmenopausal woman, with the CA 125 elevated at 78. Interestingly, HE4 was normal at 111.  Her family history is significant for multiple paternal aunts and cousins with breast cancer. Her brother has a history of prostate cancer.  Her only abdominal surgeries were a total abdominal hysterectomy and USO (unclear which side) for menorrhagia and an open cholecystectomy. She has had 3 vaginal deliveries. She has had multiple spinal (posterior approach) and hip replacement surgeries.  Interval History: On 08/20/16 she fell on her left hip and developed a hematoma. This has shown minimal improvement since the fall. She has noted right lower extremity edema for several weeks (preceeding the fall).  Lower extremity doppler of the right leg today (08/29/16) was negative for DVT.  Current  Meds:  Outpatient Encounter Prescriptions as of 08/29/2016  Medication Sig  . amLODipine (NORVASC) 5 MG tablet TAKE 1 TABLET BY MOUTH EVERY DAY  . aspirin 81 MG tablet Take 81 mg by mouth daily.  . cholecalciferol (VITAMIN D) 1000 UNITS tablet Take two tablets once daily  . enalapril (VASOTEC) 20 MG tablet TAKE 1 TABLET BY MOUTH TWICE DAILY FOR BLOOD  PRESSURE  . hydrochlorothiazide (HYDRODIURIL) 25 MG tablet TAKE 1 TABLET BY MOUTH EVERY DAY FOR SWELLING  . HYDROcodone-acetaminophen (NORCO) 10-325 MG tablet Take one tablet by mouth up to four times a day as needed for pain.  Marland Kitchen LORazepam (ATIVAN) 1 MG tablet Take one tablet by mouth every 4 hours as needed for anxiety  . metoprolol succinate (TOPROL-XL) 50 MG 24 hr tablet TAKE 1 TABLET BY MOUTH TWICE DAILY FOR BLOOD PRESSURE  . omeprazole (PRILOSEC) 20 MG capsule 20 mg. Take one tablet at bedtime to reduce stomach acid and to help heartburn.  . butalbital-acetaminophen-caffeine (FIORICET) 50-325-40 MG tablet Take one or 2 tablets every 6 hours if needed for headache (Patient not taking: Reported on 08/29/2016)  . cyclobenzaprine (FLEXERIL) 10 MG tablet One up to 3 times daily as needed to help muscle spasm and pain (Patient not taking: Reported on 08/29/2016)  . loperamide (IMODIUM A-D) 2 MG tablet Take one tablet by mouth with each loose stool up to 8 tablets in 24 hours (Patient not taking: Reported on 08/29/2016)   No facility-administered encounter medications on file as of 08/29/2016.     Allergy:  Allergies  Allergen Reactions  . Daypro [Oxaprozin]   . Erythromycin   . Talwin [Pentazocine]     Social Hx:   Social History   Social History  . Marital status: Widowed    Spouse name: N/A  . Number of children: N/A  . Years of education: N/A   Occupational History  . retired     Social History Main Topics  . Smoking status: Never Smoker  . Smokeless tobacco: Never Used  . Alcohol use No  . Drug use: No  . Sexual activity: Not Currently   Other Topics Concern  . Not on file   Social History Narrative   Lives with son and 2 grandsons   Widowed   Never smoked   Alcohol none   Exercise none   No Advance Directive   Still drives       Past Surgical Hx:  Past Surgical History:  Procedure Laterality Date  . ABDOMINAL HYSTERECTOMY  1981  . BACK SURGERY  2003   L4-L5   . BREAST LUMPECTOMY Left 1994   lymph cancer   . BREAST SURGERY  1982   cyst removed, left  . CATARACT EXTRACTION, BILATERAL  2016   [facility]  . CHOLECYSTECTOMY  1987  . DILATION AND CURETTAGE OF UTERUS  1980  . LUMBAR SPINE SURGERY  2002  . TOTAL HIP ARTHROPLASTY  2003   bilateral    Past Medical Hx:  Past Medical History:  Diagnosis Date  . Anxiety state, unspecified   . Carpal tunnel syndrome   . Degeneration of intervertebral disc, site unspecified   . Depressive disorder, not elsewhere classified   . Dysphagia, unspecified(787.20)   . Edema   . Headache(784.0)   . Insomnia, unspecified   . Lumbago   . Malignant neoplasm of breast (female), unspecified site   . Nonspecific elevation of levels of transaminase or lactic acid dehydrogenase (LDH)   . Osteoarthrosis, unspecified whether generalized  or localized, unspecified site   . Otalgia, unspecified   . Other and unspecified hyperlipidemia   . Other chronic nonalcoholic liver disease   . Other malaise and fatigue   . Other malaise and fatigue   . Other nonspecific abnormal serum enzyme levels   . Pain in joint, lower leg   . Pain in joint, pelvic region and thigh   . Pain in joint, shoulder region   . Palpitations   . Reflux esophagitis   . Type II or unspecified type diabetes mellitus without mention of complication, not stated as uncontrolled   . Unspecified essential hypertension   . Unspecified vitamin D deficiency     Past Gynecological History:  SVD x 3 No LMP recorded. Patient has had a hysterectomy.  Family Hx:  Family History  Problem Relation Age of Onset  . Heart disease Mother   . Cancer Father   . Diabetes Brother   . Cancer Brother     Review of Systems:  Constitutional  Feels well,   fatigued ENT Normal appearing ears and nares bilaterally Skin/Breast  No rash, sores, jaundice, itching, dryness Cardiovascular  No chest pain, shortness of breath, or edema  Pulmonary  No cough or  wheeze.  Gastro Intestinal  No nausea, vomitting, or diarrhoea. No bright red blood per rectum, no abdominal pain, change in bowel movement, or constipation.  Genito Urinary  No frequency, urgency, dysuria,  Musculo Skeletal  No myalgia, arthralgia, joint swelling or pain  Neurologic  No weakness, numbness, change in gait,  Psychology  No depression, anxiety, insomnia.   Vitals:  Blood pressure (!) 150/53, pulse 65, temperature 98.1 F (36.7 C), temperature source Oral, resp. rate 18, height 5\' 6"  (1.676 m), weight 144 lb 6.4 oz (65.5 kg), SpO2 99 %.  Physical Exam: WD in NAD Neck  Supple NROM, without any enlargements.  Lymph Node Survey No cervical supraclavicular or inguinal adenopathy Cardiovascular  Pulse normal rate, regularity and rhythm. S1 and S2 normal.  Lungs  Clear to auscultation bilateraly, without wheezes/crackles/rhonchi. Good air movement.  Skin  No rash/lesions/breakdown  Psychiatry  Alert and oriented to person, place, and time  Abdomen  Normoactive bowel sounds, abdomen soft, non-tender and thin without evidence of hernia. The mass is palpable in the lower abdomen on the right, minimally mobile, slightly tender, filling pelvis and lower abdomen to level of umbilicus. Back No CVA tenderness Genito Urinary  Vulva/vagina: Normal external female genitalia.  No lesions. No discharge or bleeding.  Bladder/urethra:  No lesions or masses, well supported bladder  Vagina: normal  Cervix and uterus: surgically absent  Adnexa: Smooth, firm, cystic mass filling right pelvis extending into abdomen. Minimally mobile, somewhat fixed to vaginal cuff. Rectal  Good tone, no masses no cul de sac nodularity. Can appreciated right pelvic mass - smooth and not apparently attached to rectum. Extremities  No bilateral cyanosis, clubbing or edema.   Donaciano Eva, MD  08/29/2016, 11:05 AM  Addendum: CEA resulted at 172. Concerning for metastatic GI tumor (versus  ovarian mucinous neoplasm). Recommend preoperative colonoscopy and EGD. This has been ordered in [place] at [place].

## 2016-08-29 NOTE — Progress Notes (Addendum)
*  Preliminary Results* Right lower extremity venous duplex completed. Right lower extremity is negative for deep vein thrombosis. There is evidence of acute superficial vein thrombosis involving a small segment of the lesser saphenous vein at the origin. There is no evidence of right Baker's cyst.  Preliminary results discussed with Dr. Denman George.  08/29/2016 11:51 AM  Maudry Mayhew, BS, RVT, RDCS, RDMS

## 2016-08-30 ENCOUNTER — Encounter: Payer: Self-pay | Admitting: *Deleted

## 2016-08-30 ENCOUNTER — Telehealth: Payer: Self-pay

## 2016-08-30 NOTE — Telephone Encounter (Signed)
Surgical information sent to [facility] GYN/[facility], surgical date on hold pending completion of GI consult at [facility] and colonoscopy. GI appt is 08/31/16. Pt is aware of this appointment.

## 2016-08-31 ENCOUNTER — Ambulatory Visit (INDEPENDENT_AMBULATORY_CARE_PROVIDER_SITE_OTHER): Payer: Medicare HMO | Admitting: Physician Assistant

## 2016-08-31 ENCOUNTER — Encounter: Payer: Self-pay | Admitting: Gastroenterology

## 2016-08-31 ENCOUNTER — Encounter: Payer: Self-pay | Admitting: Physician Assistant

## 2016-08-31 VITALS — BP 144/78 | HR 68 | Ht 64.5 in | Wt 147.4 lb

## 2016-08-31 DIAGNOSIS — N838 Other noninflammatory disorders of ovary, fallopian tube and broad ligament: Secondary | ICD-10-CM

## 2016-08-31 DIAGNOSIS — R970 Elevated carcinoembryonic antigen [CEA]: Secondary | ICD-10-CM

## 2016-08-31 DIAGNOSIS — N839 Noninflammatory disorder of ovary, fallopian tube and broad ligament, unspecified: Secondary | ICD-10-CM

## 2016-08-31 DIAGNOSIS — R195 Other fecal abnormalities: Secondary | ICD-10-CM

## 2016-08-31 MED ORDER — NA SULFATE-K SULFATE-MG SULF 17.5-3.13-1.6 GM/177ML PO SOLN
ORAL | 0 refills | Status: DC
Start: 2016-08-31 — End: 2016-09-07

## 2016-08-31 NOTE — Progress Notes (Signed)
Subjective:    Patient ID: Tamara Yang, female    DOB: [birth year], 83 y.o.   MRN: [id number]  HPI  Tamara Yang is a pleasant 83 year old white female, new to GI today referred by Dr. Everitt Amber for endoscopic evaluation with EGD and colonoscopy to further investigate heme-positive stool and elevated CEA in the 170 range. Patient has not had any prior endoscopic evaluation. She is undergoing preoperative workup, and anticipates surgery within the next 3 weeks for a recently identified large right ovarian mass. The patient had undergone CT of the abdomen and pelvis on 08/01/2016 with finding of prior cholecystectomy, prior hysterectomy and a large cystic and solid mass in the right pelvis and lower abdomen measuring 16.7 x 10.2 x 12.3 cm highly suspicious for ovarian cyst adenocarcinoma was also noted to have mild soft tissue stranding in the lower abdomen and pelvic omental fat no ascites. She's also been documented to have heme positive stool,, CA 125 elevated at 78, last CBC in November 2017 with hemoglobin of 13.4 hematocrit of 39.8. He denies any real abdominal pain no she is aware of the mass in her right lower abdomen. She says when she initially became ill in November 2017 she had crampy abdominal pain nausea and vomiting and did see blood a couple of times in her stool. Over the past month or so her appetite has been fairly good her weight has been staying stable and she's not been having any abdominal pain. She has not noticed any melena or hematochezia and says her bowel movements have been regular.  Review of Systems Pertinent positive and negative review of systems were noted in the above HPI section.  All other review of systems was otherwise negative.  Outpatient Encounter Prescriptions as of 08/31/2016  Medication Sig  . amLODipine (NORVASC) 5 MG tablet TAKE 1 TABLET BY MOUTH EVERY DAY  . aspirin 81 MG tablet Take 81 mg by mouth daily.  . butalbital-acetaminophen-caffeine (FIORICET)  50-325-40 MG tablet Take one or 2 tablets every 6 hours if needed for headache  . cholecalciferol (VITAMIN D) 1000 UNITS tablet Take two tablets once daily  . cyclobenzaprine (FLEXERIL) 10 MG tablet One up to 3 times daily as needed to help muscle spasm and pain  . enalapril (VASOTEC) 20 MG tablet TAKE 1 TABLET BY MOUTH TWICE DAILY FOR BLOOD PRESSURE  . hydrochlorothiazide (HYDRODIURIL) 25 MG tablet TAKE 1 TABLET BY MOUTH EVERY DAY FOR SWELLING  . HYDROcodone-acetaminophen (NORCO) 10-325 MG tablet Take one tablet by mouth up to four times a day as needed for pain.  Marland Kitchen loperamide (IMODIUM A-D) 2 MG tablet Take one tablet by mouth with each loose stool up to 8 tablets in 24 hours  . LORazepam (ATIVAN) 1 MG tablet Take one tablet by mouth every 4 hours as needed for anxiety  . metoprolol succinate (TOPROL-XL) 50 MG 24 hr tablet TAKE 1 TABLET BY MOUTH TWICE DAILY FOR BLOOD PRESSURE  . omeprazole (PRILOSEC) 20 MG capsule 20 mg. Take one tablet at bedtime to reduce stomach acid and to help heartburn.  . Na Sulfate-K Sulfate-Mg Sulf 17.5-3.13-1.6 GM/180ML SOLN Suprep-Use as directed   No facility-administered encounter medications on file as of 08/31/2016.    Allergies  Allergen Reactions  . Daypro [Oxaprozin]   . Erythromycin   . Talwin [Pentazocine]    Patient Active Problem List   Diagnosis Date Noted  . Leg edema, right 08/29/2016  . Occult blood in stools 08/29/2016  . Pelvic mass 08/29/2016  .  Abdominal mass 08/04/2016  . Diarrhea 05/25/2016  . Blood in stool 05/25/2016  . Loss of weight 05/25/2016  . Headache 01/20/2016  . Other malaise and fatigue 11/12/2013  . Unspecified vitamin D deficiency 11/21/2012  . Pain in joint, shoulder region   . Pain in joint, lower leg   . Insomnia, unspecified   . DM type 2 (diabetes mellitus, type 2) ([place])   . Hyperlipidemia   . Essential hypertension   . Nonalcoholic steatohepatitis ([facility])   . Liver enzyme elevation    Social History    Social History  . Marital status: Widowed    Spouse name: N/A  . Number of children: 3  . Years of education: N/A   Occupational History  . retired     Social History Main Topics  . Smoking status: Never Smoker  . Smokeless tobacco: Never Used  . Alcohol use No  . Drug use: No  . Sexual activity: Not Currently   Other Topics Concern  . Not on file   Social History Narrative   Lives with son and 2 grandsons   Widowed   Never smoked   Alcohol none   Exercise none   No Advance Directive   Still drives       Tamara Yang's family history includes Arthritis in her mother; Diabetes in her brother; Heart disease in her mother; Prostate cancer in her brother and father.      Objective:    Vitals:   08/31/16 1022  BP: (!) 144/78  Pulse: 68    Physical Exam well-developed elderly white female in no acute distress, pleasant accompanied by her sister blood pressure 144/78, pulse 68, height 5 foot 4, weight 147, BMI 24.9. 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 CT also suggests some omental changes, I cannot rule out early carcinomatosis. Patient is anticipating surgery within the next 2-3 weeks. #2 heme positive stool #3 elevated CEA-rule out GI primary, though doubt. Patient needs colon esophagus and stomach cleared preoperatively #4 adult-onset diabetes mellitus #5 [facility]; CT scan findings as well as Hemoccult-positive stool and  elevated CEA all discussed in detail with patient today. Colonoscopy may be technically difficult secondary to large abdominal/pelvic mass. Will schedule for colonoscopy and EGD with Dr. Silverio Decamp. Both procedures discussed in detail with the patient including risks and benefits and she is agreeable to proceed.  ( Dr Silverio Decamp, not supervising today, scheduled with you as open slots ,and  pt needs procedures done with in next couple weeks)   Tamara Steidle Genia Harold PA-C 08/31/2016   Cc: Everitt Amber, MD

## 2016-08-31 NOTE — Patient Instructions (Signed)
You have been scheduled for an endoscopy and colonoscopy. Please follow written instructions given to you at your visit today.  Please pick up your prep supplies at the pharmacy within the next 1-3 days. If you use inhalers (even only as needed), please bring them with you on the day of your procedure. Your physician has requested that you go to www.startemmi.com and enter the access code given to you at your visit today. This web site gives a general overview about your procedure. However, you should still follow specific instructions given to you by our office regarding your preparation for the procedure.  If you are age 22 or older, your body mass index should be between 23-30. Your Body mass index is 24.91 kg/m. If this is out of the aforementioned range listed, please consider follow up with your Primary Care Provider.  If you are age 52 or younger, your body mass index should be between 19-25. Your Body mass index is 24.91 kg/m. If this is out of the aformentioned range listed, please consider follow up with your Primary Care Provider.

## 2016-09-06 ENCOUNTER — Other Ambulatory Visit: Payer: Self-pay | Admitting: *Deleted

## 2016-09-06 MED ORDER — AMLODIPINE BESYLATE 5 MG PO TABS: ORAL_TABLET | ORAL | 3 refills | Status: DC

## 2016-09-06 MED ORDER — HYDROCODONE-ACETAMINOPHEN 10-325 MG PO TABS: ORAL_TABLET | ORAL | 0 refills | Status: DC

## 2016-09-06 NOTE — Telephone Encounter (Signed)
Patient requested and will pick up. Also wants to pick up Rx for BP medication to take to pharmacy. Printed.

## 2016-09-06 NOTE — Progress Notes (Signed)
Reviewed and agree with documentation and assessment and plan. K. Veena Larue Lightner , MD   

## 2016-09-07 ENCOUNTER — Ambulatory Visit (AMBULATORY_SURGERY_CENTER): Payer: Medicare HMO | Admitting: Gastroenterology

## 2016-09-07 ENCOUNTER — Encounter: Payer: Self-pay | Admitting: Gastroenterology

## 2016-09-07 VITALS — BP 157/62 | HR 88 | Temp 97.1°F | Resp 15 | Ht 64.5 in | Wt 147.0 lb

## 2016-09-07 DIAGNOSIS — R1900 Intra-abdominal and pelvic swelling, mass and lump, unspecified site: Secondary | ICD-10-CM | POA: Diagnosis not present

## 2016-09-07 DIAGNOSIS — K921 Melena: Secondary | ICD-10-CM

## 2016-09-07 DIAGNOSIS — K2950 Unspecified chronic gastritis without bleeding: Secondary | ICD-10-CM | POA: Diagnosis not present

## 2016-09-07 DIAGNOSIS — K639 Disease of intestine, unspecified: Secondary | ICD-10-CM

## 2016-09-07 DIAGNOSIS — R195 Other fecal abnormalities: Secondary | ICD-10-CM | POA: Diagnosis not present

## 2016-09-07 DIAGNOSIS — K2970 Gastritis, unspecified, without bleeding: Secondary | ICD-10-CM | POA: Diagnosis not present

## 2016-09-07 DIAGNOSIS — I10 Essential (primary) hypertension: Secondary | ICD-10-CM | POA: Diagnosis not present

## 2016-09-07 DIAGNOSIS — C19 Malignant neoplasm of rectosigmoid junction: Secondary | ICD-10-CM | POA: Diagnosis not present

## 2016-09-07 HISTORY — PX: COLONOSCOPY WITH ESOPHAGOGASTRODUODENOSCOPY (EGD): SHX5779

## 2016-09-07 MED ORDER — SODIUM CHLORIDE 0.9 % IV SOLN: 500.0000 mL | INTRAVENOUS | Status: DC

## 2016-09-07 NOTE — Progress Notes (Signed)
1334 upon admission pt. Noted with irregular heart beat,Crna brought to admitting to assess and pt. Will be placed  on moinitor.1349 pt.hooked up to 12 lead ekg and reports given to doctor and CRNA,they stated go ahead and proceed with procedures.

## 2016-09-07 NOTE — Op Note (Addendum)
[place] Patient Name: Tamara Yang Procedure Date: 09/07/2016 2:27 PM MRN: [id number] Endoscopist: Mauri Pole , MD Age: 83 Referring MD:  Date of Birth: 29-Aug-1932 Gender: Female Account #: [id number] Procedure:                Colonoscopy Indications:              Evaluation of unexplained GI bleeding Medicines:                Monitored Anesthesia Care Procedure:                Pre-Anesthesia Assessment:                           - Prior to the procedure, a History and Physical                            was performed, and patient medications and                            allergies were reviewed. The patient's tolerance of                            previous anesthesia was also reviewed. The risks                            and benefits of the procedure and the sedation                            options and risks were discussed with the patient.                            All questions were answered, and informed consent                            was obtained. Prior Anticoagulants: The patient has                            taken no previous anticoagulant or antiplatelet                            agents. ASA Grade Assessment: III - A patient with                            severe systemic disease. After reviewing the risks                            and benefits, the patient was deemed in                            satisfactory condition to undergo the procedure.                           After obtaining informed consent, the colonoscope  was passed under direct vision. Throughout the                            procedure, the patient's blood pressure, pulse, and                            oxygen saturations were monitored continuously. The                            Colonoscope was introduced through the anus and                            advanced to the the sigmoid colon to examine a                            stenosis. This was  the intended extent. The                            colonoscopy was performed without difficulty. The                            patient tolerated the procedure well. The quality                            of the bowel preparation was good. The rectum was                            photographed. Scope In: 2:50:32 PM Scope Out: 3:04:26 PM Total Procedure Duration: 0 hours 13 minutes 54 seconds  Findings:                 The perianal and digital rectal examinations were                            normal.                           An extrinsic compression vs intrinsic with friable                            granular mucosa, severe stenosis , near obstructive                            measuring of unknown length was found in the                            recto-sigmoid colon and was non-traversed.Scope                            could not be advanced beyond 20cm from anal verge.                            Biopsies were taken with a cold forceps for  histology to exclude malignancy.                           Rectum appeared normal Complications:            No immediate complications. Estimated Blood Loss:     Estimated blood loss was minimal. Impression:               - Stricture in the recto-sigmoid colon likely                            extrinsic compression from large overian mass                            lesion. Biopsied. Its causing near obstruction of                            colon near recto sigmoid junction Recommendation:           - Patient has a contact number available for                            emergencies. The signs and symptoms of potential                            delayed complications were discussed with the                            patient. Return to normal activities tomorrow.                            Written discharge instructions were provided to the                            patient.                           - Full liquid  diet. Slowly advance to low residue                            soft diet as tolerated                           - Avoid raw vegetable, salads and high fiber diet                           - No aspirin, ibuprofen, naproxen, or other                            non-steroidal anti-inflammatory drugs.                           - Await pathology results.                           - Follow up with Dr Denman George for debulking surgery                           -  No repeat colonoscopy due to age. Mauri Pole, MD 09/07/2016 3:20:50 PM This report has been signed electronically. CC Letter to:             Everitt Amber, MD

## 2016-09-07 NOTE — Op Note (Addendum)
[place] Patient Name: Tamara Yang Procedure Date: 09/07/2016 2:27 PM MRN: [id number] Endoscopist: Mauri Pole , MD Age: 83 Referring MD:  Date of Birth: 04-17-1933 Gender: Female Account #: [id number] Procedure:                Upper GI endoscopy Indications:              Gastrointestinal bleeding of unknown origin Medicines:                Monitored Anesthesia Care Procedure:                Pre-Anesthesia Assessment:                           - Prior to the procedure, a History and Physical                            was performed, and patient medications and                            allergies were reviewed. The patient's tolerance of                            previous anesthesia was also reviewed. The risks                            and benefits of the procedure and the sedation                            options and risks were discussed with the patient.                            All questions were answered, and informed consent                            was obtained. Prior Anticoagulants: The patient has                            taken no previous anticoagulant or antiplatelet                            agents. ASA Grade Assessment: III - A patient with                            severe systemic disease. After reviewing the risks                            and benefits, the patient was deemed in                            satisfactory condition to undergo the procedure.                           After obtaining informed consent, the endoscope was  passed under direct vision. Throughout the                            procedure, the patient's blood pressure, pulse, and                            oxygen saturations were monitored continuously. The                            Endoscope was introduced through the mouth, and                            advanced to the second part of duodenum. The upper                            GI  endoscopy was accomplished without difficulty.                            The patient tolerated the procedure well. Scope In: Scope Out: Findings:                 The esophagus was normal.                           Scattered mild inflammation with hemorrhage                            characterized by adherent blood, erosions, erythema                            and friability was found in the gastric antrum.                            Biopsies were taken with a cold forceps for                            histology.                           The examined duodenum was normal. Complications:            No immediate complications. Estimated Blood Loss:     Estimated blood loss was minimal. Impression:               - Normal esophagus.                           - Gastritis with hemorrhage. Biopsied.                           - Normal examined duodenum. Recommendation:           - Full liquid diet                           - Continue present medications.                           -  Await pathology results.                           - No repeat upper endoscopy.                           -Proceed with colonoscopy Mauri Pole, MD 09/07/2016 3:22:44 PM This report has been signed electronically.

## 2016-09-07 NOTE — Progress Notes (Signed)
Called to room to assist during endoscopic procedure.  Patient ID and intended procedure confirmed with present staff. Received instructions for my participation in the procedure from the performing physician.  

## 2016-09-07 NOTE — Patient Instructions (Signed)
YOU HAD AN ENDOSCOPIC PROCEDURE TODAY AT [facility]:   Refer to the procedure report that was given to you for any specific questions about what was found during the examination.  If the procedure report does not answer your questions, please call your gastroenterologist to clarify.  If you requested that your care partner not be given the details of your procedure findings, then the procedure report has been included in a sealed envelope for you to review at your convenience later.  YOU SHOULD EXPECT: Some feelings of bloating in the abdomen. Passage of more gas than usual.  Walking can help get rid of the air that was put into your GI tract during the procedure and reduce the bloating. If you had a lower endoscopy (such as a colonoscopy or flexible sigmoidoscopy) you may notice spotting of blood in your stool or on the toilet paper. If you underwent a bowel prep for your procedure, you may not have a normal bowel movement for a few days.  Please Note:  You might notice some irritation and congestion in your nose or some drainage.  This is from the oxygen used during your procedure.  There is no need for concern and it should clear up in a day or so.  SYMPTOMS TO REPORT IMMEDIATELY:   Following lower endoscopy (colonoscopy or flexible sigmoidoscopy):  Excessive amounts of blood in the stool  Significant tenderness or worsening of abdominal pains  Swelling of the abdomen that is new, acute  Fever of 100F or higher   Following upper endoscopy (EGD)  Vomiting of blood or coffee ground material  New chest pain or pain under the shoulder blades  Painful or persistently difficult swallowing  New shortness of breath  Fever of 100F or higher  Black, tarry-looking stools  For urgent or emergent issues, a gastroenterologist can be reached at any hour by calling [phone].   DIET:  We do recommend a small meal at first, but then you may proceed to your regular diet.  Drink  plenty of fluids but you should avoid alcoholic beverages for 24 hours.  ACTIVITY:  You should plan to take it easy for the rest of today and you should NOT DRIVE or use heavy machinery until tomorrow (because of the sedation medicines used during the test).    FOLLOW UP: Our staff will call the number listed on your records the next business day following your procedure to check on you and address any questions or concerns that you may have regarding the information given to you following your procedure. If we do not reach you, we will leave a message.  However, if you are feeling well and you are not experiencing any problems, there is no need to return our call.  We will assume that you have returned to your regular daily activities without incident.  If any biopsies were taken you will be contacted by phone or by letter within the next 1-3 weeks.  Please call us at [phone] if you have not heard about the biopsies in 3 weeks.    SIGNATURES/CONFIDENTIALITY: You and/or your care partner have signed paperwork which will be entered into your electronic medical record.  These signatures attest to the fact that that the information above on your After Visit Summary has been reviewed and is understood.  Full responsibility of the confidentiality of this discharge information lies with you and/or your care-partner.YOU HAD AN ENDOSCOPIC PROCEDURE TODAY AT [facility]:  Refer to the procedure report that was given to you for any specific questions about what was found during the examination.  If the procedure report does not answer your questions, please call your gastroenterologist to clarify.  If you requested that your care partner not be given the details of your procedure findings, then the procedure report has been included in a sealed envelope for you to review at your convenience later.  YOU SHOULD EXPECT: Some feelings of bloating in the abdomen. Passage of more gas than  usual.  Walking can help get rid of the air that was put into your GI tract during the procedure and reduce the bloating. If you had a lower endoscopy (such as a colonoscopy or flexible sigmoidoscopy) you may notice spotting of blood in your stool or on the toilet paper. If you underwent a bowel prep for your procedure, you may not have a normal bowel movement for a few days.  Please Note:  You might notice some irritation and congestion in your nose or some drainage.  This is from the oxygen used during your procedure.  There is no need for concern and it should clear up in a day or so.  SYMPTOMS TO REPORT IMMEDIATELY:   Following lower endoscopy (colonoscopy or flexible sigmoidoscopy):  Excessive amounts of blood in the stool  Significant tenderness or worsening of abdominal pains  Swelling of the abdomen that is new, acute  Fever of 100F or higher   Following upper endoscopy (EGD)  Vomiting of blood or coffee ground material  New chest pain or pain under the shoulder blades  Painful or persistently difficult swallowing  New shortness of breath  Fever of 100F or higher  Black, tarry-looking stools  For urgent or emergent issues, a gastroenterologist can be reached at any hour by calling [phone].   DIET:  We do recommend a small meal at first, but then you may proceed to your regular diet.  Drink plenty of fluids but you should avoid alcoholic beverages for 24 hours.  ACTIVITY:  You should plan to take it easy for the rest of today and you should NOT DRIVE or use heavy machinery until tomorrow (because of the sedation medicines used during the test).    FOLLOW UP: Our staff will call the number listed on your records the next business day following your procedure to check on you and address any questions or concerns that you may have regarding the information given to you following your procedure. If we do not reach you, we will leave a message.  However, if you are feeling  well and you are not experiencing any problems, there is no need to return our call.  We will assume that you have returned to your regular daily activities without incident.  If any biopsies were taken you will be contacted by phone or by letter within the next 1-3 weeks.  Please call us at [phone] if you have not heard about the biopsies in 3 weeks.    SIGNATURES/CONFIDENTIALITY: You and/or your care partner have signed paperwork which will be entered into your electronic medical record.  These signatures attest to the fact that that the information above on your After Visit Summary has been reviewed and is understood.  Full responsibility of the confidentiality of this discharge information lies with you and/or your care-partner.YOU HAD AN ENDOSCOPIC PROCEDURE TODAY AT [facility]:   Refer to the procedure report that was given to you for any  specific questions about what was found during the examination.  If the procedure report does not answer your questions, please call your gastroenterologist to clarify.  If you requested that your care partner not be given the details of your procedure findings, then the procedure report has been included in a sealed envelope for you to review at your convenience later.  YOU SHOULD EXPECT: Some feelings of bloating in the abdomen. Passage of more gas than usual.  Walking can help get rid of the air that was put into your GI tract during the procedure and reduce the bloating. If you had a lower endoscopy (such as a colonoscopy or flexible sigmoidoscopy) you may notice spotting of blood in your stool or on the toilet paper. If you underwent a bowel prep for your procedure, you may not have a normal bowel movement for a few days.  Please Note:  You might notice some irritation and congestion in your nose or some drainage.  This is from the oxygen used during your procedure.  There is no need for concern and it should clear up in a day or  so.  SYMPTOMS TO REPORT IMMEDIATELY:   Following lower endoscopy (colonoscopy or flexible sigmoidoscopy):  Excessive amounts of blood in the stool  Significant tenderness or worsening of abdominal pains  Swelling of the abdomen that is new, acute  Fever of 100F or higher   Following upper endoscopy (EGD)  Vomiting of blood or coffee ground material  New chest pain or pain under the shoulder blades  Painful or persistently difficult swallowing  New shortness of breath  Fever of 100F or higher  Black, tarry-looking stools  For urgent or emergent issues, a gastroenterologist can be reached at any hour by calling [phone].   DIET:  We do recommend a small meal at first, but then you may proceed to your regular diet.  Drink plenty of fluids but you should avoid alcoholic beverages for 24 hours.  ACTIVITY:  You should plan to take it easy for the rest of today and you should NOT DRIVE or use heavy machinery until tomorrow (because of the sedation medicines used during the test).    FOLLOW UP: Our staff will call the number listed on your records the next business day following your procedure to check on you and address any questions or concerns that you may have regarding the information given to you following your procedure. If we do not reach you, we will leave a message.  However, if you are feeling well and you are not experiencing any problems, there is no need to return our call.  We will assume that you have returned to your regular daily activities without incident.  If any biopsies were taken you will be contacted by phone or by letter within the next 1-3 weeks.  Please call us at [phone] if you have not heard about the biopsies in 3 weeks.    SIGNATURES/CONFIDENTIALITY: You and/or your care partner have signed paperwork which will be entered into your electronic medical record.  These signatures attest to the fact that that the information above on your After  Visit Summary has been reviewed and is understood.  Full responsibility of the confidentiality of this discharge information lies with you and/or your care-partner.YOU HAD AN ENDOSCOPIC PROCEDURE TODAY AT [facility]:   Refer to the procedure report that was given to you for any specific questions about what was found during the examination.  If the  procedure report does not answer your questions, please call your gastroenterologist to clarify.  If you requested that your care partner not be given the details of your procedure findings, then the procedure report has been included in a sealed envelope for you to review at your convenience later.  YOU SHOULD EXPECT: Some feelings of bloating in the abdomen. Passage of more gas than usual.  Walking can help get rid of the air that was put into your GI tract during the procedure and reduce the bloating. If you had a lower endoscopy (such as a colonoscopy or flexible sigmoidoscopy) you may notice spotting of blood in your stool or on the toilet paper. If you underwent a bowel prep for your procedure, you may not have a normal bowel movement for a few days.  Please Note:  You might notice some irritation and congestion in your nose or some drainage.  This is from the oxygen used during your procedure.  There is no need for concern and it should clear up in a day or so.  SYMPTOMS TO REPORT IMMEDIATELY:   Following lower endoscopy (colonoscopy or flexible sigmoidoscopy):  Excessive amounts of blood in the stool  Significant tenderness or worsening of abdominal pains  Swelling of the abdomen that is new, acute  Fever of 100F or higher   Following upper endoscopy (EGD)  Vomiting of blood or coffee ground material  New chest pain or pain under the shoulder blades  Painful or persistently difficult swallowing  New shortness of breath  Fever of 100F or higher  Black, tarry-looking stools  For urgent or emergent issues, a  gastroenterologist can be reached at any hour by calling [phone].   DIET:  We do recommend a small meal at first, but then you may proceed to your regular diet.  Drink plenty of fluids but you should avoid alcoholic beverages for 24 hours.  ACTIVITY:  You should plan to take it easy for the rest of today and you should NOT DRIVE or use heavy machinery until tomorrow (because of the sedation medicines used during the test).    FOLLOW UP: Our staff will call the number listed on your records the next business day following your procedure to check on you and address any questions or concerns that you may have regarding the information given to you following your procedure. If we do not reach you, we will leave a message.  However, if you are feeling well and you are not experiencing any problems, there is no need to return our call.  We will assume that you have returned to your regular daily activities without incident.  If any biopsies were taken you will be contacted by phone or by letter within the next 1-3 weeks.  Please call us at [phone] if you have not heard about the biopsies in 3 weeks.    SIGNATURES/CONFIDENTIALITY: You and/or your care partner have signed paperwork which will be entered into your electronic medical record.  These signatures attest to the fact that that the information above on your After Visit Summary has been reviewed and is understood.  Full responsibility of the confidentiality of this discharge information lies with you and/or your care-partner.  Gastritis information given.  Do full liquid diet today.  Tomorrow gradually add low residue diet as tolerated. May refer to Low fiber food choices

## 2016-09-07 NOTE — Progress Notes (Signed)
Pt's states no medical or surgical changes since previsit or office visit. 

## 2016-09-08 ENCOUNTER — Telehealth: Payer: Self-pay | Admitting: *Deleted

## 2016-09-08 NOTE — Telephone Encounter (Signed)
  Follow up Call-  Call back number 09/07/2016  Post procedure Call Back phone  # [phone]  Permission to leave phone message Yes  Some recent data might be hidden     Patient questions:  Do you have a fever, pain , or abdominal swelling? No. Pain Score  0 *  Have you tolerated food without any problems? Yes.    Have you been able to return to your normal activities? Yes.    Do you have any questions about your discharge instructions: Diet   No. Medications  No. Follow up visit  No.  Do you have questions or concerns about your Care? No.  Actions:  If pain score is 4 or above: none No action needed, pain <4.

## 2016-09-09 ENCOUNTER — Telehealth: Payer: Self-pay

## 2016-09-09 NOTE — Telephone Encounter (Signed)
Tamara Yang was calling to see if Dr. Denman George is  available to discuss colonoscopy results with Dr. Burna Mortimer. Dr. Burna Mortimer concerned that patient is at high risk  For a bowel obstruction.  Dr. Denman George is off today.  [name] Dr. Serita Grit cell number for Dr. Burna Mortimer to call Dr. Denman George directly.

## 2016-09-09 NOTE — Telephone Encounter (Signed)
Call to the Oncology office and asked to speak with Dr. Serita Grit nurse. Put into voicemail. Asked for a call back and left office number and my name. Also asked a message be given to Dr Denman George that Dr Silverio Decamp would like to speak with her about this patient. Advised to see the procedure note in EPIC. Concern for high risk of obstruction.

## 2016-09-12 ENCOUNTER — Other Ambulatory Visit: Payer: Medicare Other

## 2016-09-13 ENCOUNTER — Other Ambulatory Visit: Payer: Medicare HMO

## 2016-09-13 DIAGNOSIS — E119 Type 2 diabetes mellitus without complications: Secondary | ICD-10-CM | POA: Diagnosis not present

## 2016-09-13 DIAGNOSIS — E785 Hyperlipidemia, unspecified: Secondary | ICD-10-CM

## 2016-09-13 LAB — LIPID PANEL
CHOL/HDL RATIO: 4 ratio (ref <5.0)
CHOLESTEROL: 187 mg/dL (ref <200)
HDL: 47 mg/dL — AB (ref >50)
LDL Cholesterol: 119 mg/dL — ABNORMAL HIGH (ref <100)
Triglycerides: 105 mg/dL (ref <150)
VLDL: 21 mg/dL (ref <30)

## 2016-09-14 ENCOUNTER — Other Ambulatory Visit: Payer: Self-pay | Admitting: *Deleted

## 2016-09-14 ENCOUNTER — Ambulatory Visit: Payer: Medicare Other | Admitting: Internal Medicine

## 2016-09-14 LAB — HEMOGLOBIN A1C
Hgb A1c MFr Bld: 5 % (ref <5.7)
Mean Plasma Glucose: 97 mg/dL

## 2016-09-14 MED ORDER — LORAZEPAM 1 MG PO TABS: ORAL_TABLET | ORAL | 1 refills | Status: DC

## 2016-09-14 NOTE — Telephone Encounter (Signed)
Patient requested Refill to be faxed to pharmacy.

## 2016-09-19 ENCOUNTER — Telehealth: Payer: Self-pay | Admitting: *Deleted

## 2016-09-19 DIAGNOSIS — E119 Type 2 diabetes mellitus without complications: Secondary | ICD-10-CM | POA: Diagnosis not present

## 2016-09-19 DIAGNOSIS — R1900 Intra-abdominal and pelvic swelling, mass and lump, unspecified site: Secondary | ICD-10-CM

## 2016-09-19 DIAGNOSIS — Z01810 Encounter for preprocedural cardiovascular examination: Secondary | ICD-10-CM

## 2016-09-19 DIAGNOSIS — Z01818 Encounter for other preprocedural examination: Secondary | ICD-10-CM

## 2016-09-19 DIAGNOSIS — K219 Gastro-esophageal reflux disease without esophagitis: Secondary | ICD-10-CM | POA: Diagnosis not present

## 2016-09-19 DIAGNOSIS — R69 Illness, unspecified: Secondary | ICD-10-CM | POA: Diagnosis not present

## 2016-09-19 DIAGNOSIS — I10 Essential (primary) hypertension: Secondary | ICD-10-CM | POA: Diagnosis not present

## 2016-09-19 DIAGNOSIS — I4891 Unspecified atrial fibrillation: Secondary | ICD-10-CM | POA: Diagnosis not present

## 2016-09-19 HISTORY — DX: Intra-abdominal and pelvic swelling, mass and lump, unspecified site: R19.00

## 2016-09-19 NOTE — Telephone Encounter (Signed)
Schedule her with [facility]

## 2016-09-19 NOTE — Telephone Encounter (Signed)
What should I put as a diagnosis?

## 2016-09-19 NOTE — Telephone Encounter (Signed)
Tamara Yang with [facility] called and stated that patient was there today for Pre Op for upcoming surgery on the 30th. They found something concerning on patient's EKG and wants her to see a Cardiologist. She doesn't want to see one there, she wants Dr. Nyoka Cowden to refer her to one here in [place]. Tamara Yang is not sure of the diagnosis and will fax [place] the notes once transcribed along with the EKG. She thinks she will be able to send the records tomorrow to my attention. Please Advise.

## 2016-09-20 NOTE — Telephone Encounter (Signed)
Preoperative cardiac clearance

## 2016-09-20 NOTE — Telephone Encounter (Signed)
Order placed for cardiology Referral.

## 2016-09-21 ENCOUNTER — Encounter: Payer: Self-pay | Admitting: Internal Medicine

## 2016-09-21 ENCOUNTER — Ambulatory Visit (INDEPENDENT_AMBULATORY_CARE_PROVIDER_SITE_OTHER): Payer: Medicare HMO | Admitting: Internal Medicine

## 2016-09-21 VITALS — BP 130/78 | HR 65 | Temp 97.9°F | Ht 65.0 in | Wt 138.0 lb

## 2016-09-21 DIAGNOSIS — R634 Abnormal weight loss: Secondary | ICD-10-CM | POA: Diagnosis not present

## 2016-09-21 DIAGNOSIS — R1909 Other intra-abdominal and pelvic swelling, mass and lump: Secondary | ICD-10-CM | POA: Diagnosis not present

## 2016-09-21 DIAGNOSIS — I4891 Unspecified atrial fibrillation: Secondary | ICD-10-CM

## 2016-09-21 DIAGNOSIS — I480 Paroxysmal atrial fibrillation: Secondary | ICD-10-CM | POA: Diagnosis not present

## 2016-09-21 DIAGNOSIS — R600 Localized edema: Secondary | ICD-10-CM | POA: Diagnosis not present

## 2016-09-21 DIAGNOSIS — R197 Diarrhea, unspecified: Secondary | ICD-10-CM | POA: Diagnosis not present

## 2016-09-21 DIAGNOSIS — I10 Essential (primary) hypertension: Secondary | ICD-10-CM

## 2016-09-21 NOTE — Patient Instructions (Signed)
Stop amlodipine.

## 2016-09-21 NOTE — Progress Notes (Signed)
Facility  [facility]    Place of Service:   OFFICE    Allergies  Allergen Reactions  . Daypro [Oxaprozin]   . Erythromycin   . Talwin [Pentazocine]     Chief Complaint  Patient presents with  . Medical Management of Chronic Issues    3 month medication management blood pressure, blood sugar, cholesterol, review labs. Here with sister Hassan Rowan and grandson Cheri Rous.  . 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 Clarene Essex at [facility]. In the preop work up they found a new onset AF with RVR at a rate of 150. Cardiology consult is requested .  Patient says that she was told there was some kind of heart irregularity ot the time of her colonoscopy, but the procedure went ahead as planned on 09/07/16  She had an abd mass noted on CT abd and pelvis 08/01/16. 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 She has lost 9 3 in the lst 3 weeks and 42# since 11/12/13.  She had acute episode of lower abd pain, vomiting,  and diarrhea on 09/17/16. No blood in vomitus or stool.  It is resolved.  Has chronic edema of the right leg.   Medications: Patient's Medications  New Prescriptions   No medications on file  Previous Medications   AMLODIPINE (NORVASC) 5 MG TABLET    Take one tablet by mouth once daily to control blood pressure   ASPIRIN 81 MG TABLET    Take 81 mg by mouth daily.   BUTALBITAL-ACETAMINOPHEN-CAFFEINE (FIORICET) 50-325-40 MG TABLET    Take one or 2 tablets every 6 hours if needed for headache   CHOLECALCIFEROL (VITAMIN D) 1000 UNITS TABLET    Take two tablets once daily   CYCLOBENZAPRINE (FLEXERIL) 10 MG TABLET    One up to 3 times daily as needed to help muscle spasm and pain   ENALAPRIL (VASOTEC) 20 MG TABLET    TAKE 1 TABLET BY MOUTH TWICE DAILY FOR BLOOD PRESSURE   HYDROCHLOROTHIAZIDE (HYDRODIURIL) 25 MG TABLET    TAKE 1 TABLET BY MOUTH EVERY DAY FOR SWELLING   HYDROCODONE-ACETAMINOPHEN (NORCO) 10-325  MG TABLET    Take one tablet by mouth up to four times a day as needed for pain.   LOPERAMIDE (IMODIUM A-D) 2 MG TABLET    Take one tablet by mouth with each loose stool up to 8 tablets in 24 hours   LORAZEPAM (ATIVAN) 1 MG TABLET    Take one tablet by mouth every 4 hours as needed for anxiety   METOPROLOL SUCCINATE (TOPROL-XL) 50 MG 24 HR TABLET    TAKE 1 TABLET BY MOUTH TWICE DAILY FOR BLOOD PRESSURE   OMEPRAZOLE (PRILOSEC) 20 MG CAPSULE    20 mg. Take one tablet at bedtime to reduce stomach acid and to help heartburn.  Modified Medications   No medications on file  Discontinued Medications   No medications on file    Review of Systems  Constitutional: Negative for activity change, appetite change, fatigue, fever and unexpected weight change.  HENT: Positive for hearing loss and tinnitus. Negative for sore throat.   Eyes:       Corrective lenses. Normal diabetic eye exam by Dr. [name] 07/13/2010. Cataract surgery bilaterally in June 2016.  Respiratory: Positive for cough.   Cardiovascular: Positive for leg swelling (right).  Gastrointestinal: Positive for diarrhea.       Small amount of  blood in the stool on 05/21/16.  Endocrine:       History of diabetes  Genitourinary: Negative.   Musculoskeletal: Positive for arthralgias, back pain, neck pain and neck stiffness.       Diffuse joint pains. Bilateral shoulder pain R>L. Generalized arthritis is present. Has right knee pains. Gait stable.  Skin: Negative.   Allergic/Immunologic: Negative.   Neurological: Positive for headaches. Negative for dizziness, tremors, seizures, facial asymmetry, speech difficulty, weakness and numbness.  Hematological: Negative.   Psychiatric/Behavioral: Negative.        "Feeling tremulous inside"    Vitals:   09/21/16 1126  BP: 130/78  Pulse: 65  Temp: 97.9 F (36.6 C)  TempSrc: Oral  SpO2: 98%  Weight: 138 lb (62.6 kg)  Height: '5\' 5"'$  (1.651 m)   Body mass index is 22.96 kg/m. Wt Readings from  Last 3 Encounters:  09/21/16 138 lb (62.6 kg)  09/07/16 147 lb (66.7 kg)  08/31/16 147 lb 6 oz (66.8 kg)      Physical Exam  Constitutional: She is oriented to person, place, and time. She appears well-developed and well-nourished. No distress.  Mildly overweight.  HENT:  Head: Normocephalic and atraumatic.  Bilateral partial deafness.  Eyes: Conjunctivae and EOM are normal. Pupils are equal, round, and reactive to light. Left eye exhibits no discharge. No scleral icterus.  Neck: Normal range of motion. Neck supple. No JVD present. No tracheal deviation present. No thyromegaly present.  Cardiovascular: Normal rate, regular rhythm, normal heart sounds and intact distal pulses.  Exam reveals no gallop and no friction rub.   No murmur heard. Pulmonary/Chest: Effort normal and breath sounds normal. No respiratory distress. She has no wheezes. She has no rales.  Abdominal: Soft. Bowel sounds are normal. She exhibits mass. She exhibits no distension. There is tenderness in the right lower quadrant.  Firm mass that is tender upon palpation (~12 cm diameter).   Genitourinary: Rectal exam shows guaiac positive stool. Vaginal discharge found.  Genitourinary Comments: Small internal hemorrhoid  Musculoskeletal: She exhibits tenderness. She exhibits no edema (1+ right lower leg).  Reduced ability to raise her arms. Bilateral shoulder pain. Right knee pains. Right knee effusion.  Lymphadenopathy:    She has no cervical adenopathy.  Neurological: She is alert and oriented to person, place, and time. No cranial nerve deficit. Coordination normal.  01/20/16 MMSE 30/30  Skin: Skin is warm and dry. No rash noted. She is not diaphoretic. No erythema. No pallor.  Psychiatric: She has a normal mood and affect. Her behavior is normal. Judgment and thought content normal.    Labs reviewed: Lab Summary Latest Ref Rng & Units 09/13/2016 05/25/2016  Hemoglobin 11.7 - 15.5 g/dL (None) 13.4  Hematocrit 35.0 -  45.0 % (None) 39.8  White count 3.8 - 10.8 K/uL (None) 5.9  Platelet count 140 - 400 K/uL (None) 302  Sodium 135 - 146 mmol/L (None) 140  Potassium 3.5 - 5.3 mmol/L (None) 4.0  Calcium 8.6 - 10.4 mg/dL (None) 9.8  Phosphorus - (None) (None)  Creatinine 0.60 - 0.88 mg/dL (None) 0.97(H)  AST 10 - 35 U/L (None) 22  Alk Phos 33 - 130 U/L (None) 48  Bilirubin 0.2 - 1.2 mg/dL (None) 0.8  Glucose 65 - 99 mg/dL (None) 110(H)  Cholesterol <200 mg/dL 187 (None)  HDL cholesterol >50 mg/dL 47(L) (None)  Triglycerides <150 mg/dL 105 (None)  LDL Direct - (None) (None)  LDL Calc <100 mg/dL 119(H) (None)  Total protein 6.1 - 8.1  g/dL (None) 6.5  Albumin 3.6 - 5.1 g/dL (None) 3.5(L)  Some recent data might be hidden   Lab Results  Component Value Date   TSH 2.51 05/25/2016   Lab Results  Component Value Date   BUN 17 05/25/2016   BUN 18 01/18/2016   BUN 18 07/27/2015   Lab Results  Component Value Date   HGBA1C 5.0 09/13/2016   HGBA1C 6.0 (H) 01/18/2016   HGBA1C 6.1 (H) 07/27/2015    Assessment/Plan  1. PAF (paroxysmal atrial fibrillation) (HCC) Currently in NSR. She is not anticoagulated due to pending abdominal surgery. - EKG 12-Lead - Ambulatory referral to Cardiology  2. Diarrhea, unspecified type resolved  3. Essential hypertension Controlled Stop amlodipine.  4. Abdominal mass of other site Scheduled for surgery  5. Loss of weight Related to abd mass/ cancer  6. Leg edema, right chronic and unchanged

## 2016-09-22 ENCOUNTER — Telehealth: Payer: Self-pay

## 2016-09-22 NOTE — Telephone Encounter (Signed)
Spoke with sister in law Manus Gunning to notify her that she has an appt with Dr. Einar Gip on 09/26/2016 at 9:00 am. She expressed understanding.

## 2016-09-26 DIAGNOSIS — I480 Paroxysmal atrial fibrillation: Secondary | ICD-10-CM | POA: Diagnosis not present

## 2016-09-26 DIAGNOSIS — Z01810 Encounter for preprocedural cardiovascular examination: Secondary | ICD-10-CM | POA: Diagnosis not present

## 2016-09-26 DIAGNOSIS — I10 Essential (primary) hypertension: Secondary | ICD-10-CM | POA: Diagnosis not present

## 2016-09-30 DIAGNOSIS — Z853 Personal history of malignant neoplasm of breast: Secondary | ICD-10-CM | POA: Diagnosis not present

## 2016-09-30 DIAGNOSIS — R1900 Intra-abdominal and pelvic swelling, mass and lump, unspecified site: Secondary | ICD-10-CM | POA: Diagnosis not present

## 2016-09-30 DIAGNOSIS — C189 Malignant neoplasm of colon, unspecified: Secondary | ICD-10-CM | POA: Diagnosis not present

## 2016-09-30 DIAGNOSIS — Z90710 Acquired absence of both cervix and uterus: Secondary | ICD-10-CM | POA: Diagnosis not present

## 2016-09-30 DIAGNOSIS — C7961 Secondary malignant neoplasm of right ovary: Secondary | ICD-10-CM | POA: Diagnosis not present

## 2016-09-30 DIAGNOSIS — K219 Gastro-esophageal reflux disease without esophagitis: Secondary | ICD-10-CM | POA: Diagnosis not present

## 2016-09-30 DIAGNOSIS — C784 Secondary malignant neoplasm of small intestine: Secondary | ICD-10-CM | POA: Diagnosis not present

## 2016-09-30 DIAGNOSIS — R600 Localized edema: Secondary | ICD-10-CM | POA: Diagnosis not present

## 2016-09-30 DIAGNOSIS — Z7982 Long term (current) use of aspirin: Secondary | ICD-10-CM | POA: Diagnosis not present

## 2016-09-30 DIAGNOSIS — R69 Illness, unspecified: Secondary | ICD-10-CM | POA: Diagnosis not present

## 2016-09-30 DIAGNOSIS — C772 Secondary and unspecified malignant neoplasm of intra-abdominal lymph nodes: Secondary | ICD-10-CM | POA: Diagnosis not present

## 2016-09-30 DIAGNOSIS — E119 Type 2 diabetes mellitus without complications: Secondary | ICD-10-CM | POA: Diagnosis not present

## 2016-09-30 DIAGNOSIS — R1909 Other intra-abdominal and pelvic swelling, mass and lump: Secondary | ICD-10-CM | POA: Diagnosis not present

## 2016-09-30 DIAGNOSIS — C187 Malignant neoplasm of sigmoid colon: Secondary | ICD-10-CM | POA: Diagnosis not present

## 2016-09-30 DIAGNOSIS — C786 Secondary malignant neoplasm of retroperitoneum and peritoneum: Secondary | ICD-10-CM | POA: Diagnosis not present

## 2016-09-30 DIAGNOSIS — N839 Noninflammatory disorder of ovary, fallopian tube and broad ligament, unspecified: Secondary | ICD-10-CM | POA: Diagnosis not present

## 2016-09-30 DIAGNOSIS — Z01810 Encounter for preprocedural cardiovascular examination: Secondary | ICD-10-CM | POA: Diagnosis not present

## 2016-10-04 DIAGNOSIS — C189 Malignant neoplasm of colon, unspecified: Secondary | ICD-10-CM | POA: Insufficient documentation

## 2016-10-05 ENCOUNTER — Ambulatory Visit (INDEPENDENT_AMBULATORY_CARE_PROVIDER_SITE_OTHER): Payer: Medicare HMO | Admitting: Internal Medicine

## 2016-10-05 ENCOUNTER — Encounter: Payer: Self-pay | Admitting: Internal Medicine

## 2016-10-05 VITALS — BP 132/76 | HR 64 | Temp 98.0°F | Ht 65.0 in | Wt 146.0 lb

## 2016-10-05 DIAGNOSIS — H269 Unspecified cataract: Secondary | ICD-10-CM | POA: Insufficient documentation

## 2016-10-05 DIAGNOSIS — I10 Essential (primary) hypertension: Secondary | ICD-10-CM

## 2016-10-05 DIAGNOSIS — E119 Type 2 diabetes mellitus without complications: Secondary | ICD-10-CM

## 2016-10-05 DIAGNOSIS — F419 Anxiety disorder, unspecified: Secondary | ICD-10-CM | POA: Insufficient documentation

## 2016-10-05 DIAGNOSIS — C187 Malignant neoplasm of sigmoid colon: Secondary | ICD-10-CM

## 2016-10-05 DIAGNOSIS — R1909 Other intra-abdominal and pelvic swelling, mass and lump: Secondary | ICD-10-CM

## 2016-10-05 DIAGNOSIS — I480 Paroxysmal atrial fibrillation: Secondary | ICD-10-CM

## 2016-10-05 DIAGNOSIS — K219 Gastro-esophageal reflux disease without esophagitis: Secondary | ICD-10-CM | POA: Insufficient documentation

## 2016-10-05 NOTE — Progress Notes (Signed)
Facility  [facility]    Place of Service:   OFFICE    Allergies  Allergen Reactions  . Daypro [Oxaprozin]   . Erythromycin   . Talwin [Pentazocine]     Chief Complaint  Patient presents with  . TOC    09/30/16 TO 10/03/16 ovarian mass surgery, at [facility]. Here with sister Steward Drone    HPI:  Dr. Kyla Balzarine did surgery to remove abd mass during her hospital stay from 09/30/16 to 10/03/16 in [facility]. Notes indicate there was colon cancer spread to the ovary and abd. Resection (Initial) Ovarian/Tubal/Primary Peritoneal Malignancy W/Bilat Salpingo-Oophorectomy/Omentectomy Colectomy, Partial; With Coloproctostomy (Low Pelvic Anastomosis)  Path:   A: Ovary and tube, right, salpingo-oophorectomy Ovary - Metastatic colon adenocarcinoma, 17 cm in greatest dimension (please see comment)  Fallopian tube - No specific pathologic abnormality  B: Sigmoid colon and rectum, sigmoidectomy and partial rectal resection - Mucinous adenocarcinoma (please see synoptic report) - Two of fifteen lymph nodes positive for metastatic adenocarcinoma (2/15) - Largest metastatic focus measures 6 mm  C: Omentum, partial omentectomy  - Metastatic colon adenocarcinoma  D: Small bowel mesentery, biopsy  - Metastatic colon adenocarcinoma  E: Distal rectal margin, biopsy  - Bowel mucosa with no carcinoma identified  F: Proximal colon margin, biopsy - Bowel mucosa with no carcinoma identified   Synoptic Report COLON AND RECTUM: Resection, Including Transanal Disk Excision of Rectal Neoplasms(Colon Res - All Specimens)   SPECIMEN  Procedure:Sigmoidectomy and partial rectal resection   Macroscopic Intactness of Mesorectum:Complete   TUMOR  Tumor Site:Sigmoid colon   Histologic Type:Mucinous adenocarcinoma   Histologic Grade:G2: Moderately differentiated   Tumor Size:Greatest dimension in Centimeters (cm): 3.3 Centimeters (cm)  Tumor Deposits:Present     Number of Deposits:3   Tumor Extension:Tumor invades the visceral peritoneum   Macroscopic Tumor Perforation:Cannot be determined: Numerous cauterized adhesions, but no exudate or definite perforation   Lymphovascular Invasion:Present   :Small vessel lymphovascular invasion   Perineural Invasion:Present   Type of Polyp in Which Invasive Carcinoma Arose:Tubular adenoma   Treatment Effect:No known presurgical therapy   MARGINS  Margins:  Proximal Margin:Uninvolved by invasive carcinoma   Distance of Tumor from Margin:8 Centimeters (cm)  Distal Margin:Uninvolved by invasive carcinoma   Distance of Tumor from Margin:4.5 Centimeters (cm)  Radial or Mesenteric Margin:Involved by invasive carcinoma (tumor present 0-1 mm from margin)   LYMPH NODES  Regional Lymph Nodes:  Number of Lymph Nodes Involved:2   Number of Lymph Nodes Examined:15   PATHOLOGIC STAGE CLASSIFICATION (pTNM, AJCC 8th Edition)  Primary Tumor (pT):pT4a   Regional Lymph Nodes (pN):pN1b   Distant Metastasis (pM):pM1c   ADDITIONAL FINDINGS  Additional Pathologic Findings:Adenoma(s)    Comment Immunohistochemical stains were performed on block A3 (ovary) and block B17 (colon). At both sites, tumor is strongly and diffusely positive for CK20 and CDX2, and negative for CK7, supporting the diagnosis of primary colon adenocarcinoma. Dr. Aliene Altes, GI Pathology, reviewed diagnostic slides from this case and concurs with the interpretation and staging of this carcinoma.   Clinical History 83 year old female with a 16.7 cm right pelvic mass and CA-125 of 78 and CEA of 172.       Medications: Patient's Medications  New Prescriptions   No medications on file  Previous Medications   ASPIRIN 81 MG TABLET    Take 81 mg by mouth daily.    BUTALBITAL-ACETAMINOPHEN-CAFFEINE (FIORICET) 50-325-40 MG TABLET    Take one or 2 tablets every 6 hours if needed for  headache   CHOLECALCIFEROL (VITAMIN D) 1000 UNITS TABLET    Take two tablets once daily   CYCLOBENZAPRINE (FLEXERIL) 10 MG TABLET    One up to 3 times daily as needed to help muscle spasm and pain   ENALAPRIL (VASOTEC) 20 MG TABLET    TAKE 1 TABLET BY MOUTH TWICE DAILY FOR BLOOD PRESSURE   HYDROCHLOROTHIAZIDE (HYDRODIURIL) 25 MG TABLET    TAKE 1 TABLET BY MOUTH EVERY DAY FOR SWELLING   HYDROCODONE-ACETAMINOPHEN (NORCO) 10-325 MG TABLET    Take one tablet by mouth up to four times a day as needed for pain.   LOPERAMIDE (IMODIUM A-D) 2 MG TABLET    Take one tablet by mouth with each loose stool up to 8 tablets in 24 hours   LORAZEPAM (ATIVAN) 1 MG TABLET    Take one tablet by mouth every 4 hours as needed for anxiety   METOPROLOL SUCCINATE (TOPROL-XL) 50 MG 24 HR TABLET    TAKE 1 TABLET BY MOUTH TWICE DAILY FOR BLOOD PRESSURE   OMEPRAZOLE (PRILOSEC) 20 MG CAPSULE    20 mg. Take one tablet at bedtime to reduce stomach acid and to help heartburn.  Modified Medications   No medications on file  Discontinued Medications   No medications on file    Review of Systems  Constitutional: Negative for activity change, appetite change, fatigue, fever and unexpected weight change.  HENT: Positive for hearing loss and tinnitus. Negative for sore throat.   Eyes:       Corrective lenses. Normal diabetic eye exam by Dr. [name] 07/13/2010. Cataract surgery bilaterally in June 2016.  Respiratory: Positive for cough.   Cardiovascular: Positive for leg swelling (right).  Gastrointestinal: Positive for diarrhea.       Colon cancer with metastasis to abd cavity and ovary  Endocrine:       History of diabetes  Genitourinary: Negative.   Musculoskeletal: Positive for arthralgias, back pain, neck pain and neck stiffness.       Diffuse joint pains. Bilateral shoulder pain R>L. Generalized arthritis is  present. Has right knee pains. Gait stable.  Skin: Negative.   Allergic/Immunologic: Negative.   Neurological: Positive for headaches. Negative for dizziness, tremors, seizures, facial asymmetry, speech difficulty, weakness and numbness.  Hematological: Negative.   Psychiatric/Behavioral: Negative.        "Feeling tremulous inside"    Vitals:   10/05/16 1353  BP: 132/76  Pulse: 64  Temp: 98 F (36.7 C)  TempSrc: Oral  SpO2: 98%  Weight: 146 lb (66.2 kg)  Height: '5\' 5"'$  (1.651 m)   Body mass index is 24.3 kg/m. Wt Readings from Last 3 Encounters:  10/05/16 146 lb (66.2 kg)  09/21/16 138 lb (62.6 kg)  09/07/16 147 lb (66.7 kg)      Physical Exam  Constitutional: She is oriented to person, place, and time. She appears well-developed and well-nourished. No distress.  Mildly overweight.  HENT:  Head: Normocephalic and atraumatic.  Bilateral partial deafness.  Eyes: Conjunctivae and EOM are normal. Pupils are equal, round, and reactive to light. Left eye exhibits no discharge. No scleral icterus.  Neck: Normal range of motion. Neck supple. No JVD present. No tracheal deviation present. No thyromegaly present.  Cardiovascular: Normal rate, regular rhythm, normal heart sounds and intact distal pulses.  Exam reveals no gallop and no friction rub.   No murmur heard. Pulmonary/Chest: Effort normal and breath sounds normal. No respiratory distress. She has no wheezes. She has no rales.  Abdominal: Soft.  Bowel sounds are normal. She exhibits no distension. There is tenderness in the right lower quadrant.  Healing midline incision from xiphoid to pubis.  Genitourinary:  Genitourinary Comments: HX small internal hemorrhoid  Musculoskeletal: She exhibits tenderness. She exhibits no edema (1+ right lower leg).  Reduced ability to raise her arms. Bilateral shoulder pain. Right knee pains. Right knee effusion.  Lymphadenopathy:    She has no cervical adenopathy.  Neurological: She is  alert and oriented to person, place, and time. No cranial nerve deficit. Coordination normal.  01/20/16 MMSE 30/30  Skin: Skin is warm and dry. No rash noted. She is not diaphoretic. No erythema. No pallor.  Psychiatric: She has a normal mood and affect. Her behavior is normal. Judgment and thought content normal.    Labs reviewed: Lab Summary Latest Ref Rng & Units 09/13/2016 05/25/2016  Hemoglobin 11.7 - 15.5 g/dL (None) 13.4  Hematocrit 35.0 - 45.0 % (None) 39.8  White count 3.8 - 10.8 K/uL (None) 5.9  Platelet count 140 - 400 K/uL (None) 302  Sodium 135 - 146 mmol/L (None) 140  Potassium 3.5 - 5.3 mmol/L (None) 4.0  Calcium 8.6 - 10.4 mg/dL (None) 9.8  Phosphorus - (None) (None)  Creatinine 0.60 - 0.88 mg/dL (None) 0.97(H)  AST 10 - 35 U/L (None) 22  Alk Phos 33 - 130 U/L (None) 48  Bilirubin 0.2 - 1.2 mg/dL (None) 0.8  Glucose 65 - 99 mg/dL (None) 110(H)  Cholesterol <200 mg/dL 187 (None)  HDL cholesterol >50 mg/dL 47(L) (None)  Triglycerides <150 mg/dL 105 (None)  LDL Direct - (None) (None)  LDL Calc <100 mg/dL 119(H) (None)  Total protein 6.1 - 8.1 g/dL (None) 6.5  Albumin 3.6 - 5.1 g/dL (None) 3.5(L)  Some recent data might be hidden   Lab Results  Component Value Date   TSH 2.51 05/25/2016   Lab Results  Component Value Date   BUN 17 05/25/2016   BUN 18 01/18/2016   BUN 18 07/27/2015   Lab Results  Component Value Date   HGBA1C 5.0 09/13/2016   HGBA1C 6.0 (H) 01/18/2016   HGBA1C 6.1 (H) 07/27/2015    Assessment/Plan 1. Essential hypertension controlled  2. PAF (paroxysmal atrial fibrillation) (HCC) NSR at this time  3. Abdominal mass of other site Colon cancer  4. Malignant neoplasm of sigmoid colon ([place]) Removed. Has appt with oncology.  5. Type 2 diabetes mellitus without complication, without long-term current use of insulin ([place]) Controlled by diet

## 2016-10-05 NOTE — Patient Instructions (Signed)
Senokot-S   2-4 tablets at bed for laxative.

## 2016-10-07 DIAGNOSIS — R1900 Intra-abdominal and pelvic swelling, mass and lump, unspecified site: Secondary | ICD-10-CM | POA: Diagnosis not present

## 2016-10-17 DIAGNOSIS — C187 Malignant neoplasm of sigmoid colon: Secondary | ICD-10-CM | POA: Diagnosis not present

## 2016-10-17 DIAGNOSIS — Z09 Encounter for follow-up examination after completed treatment for conditions other than malignant neoplasm: Secondary | ICD-10-CM | POA: Diagnosis not present

## 2016-10-18 ENCOUNTER — Other Ambulatory Visit: Payer: Self-pay | Admitting: *Deleted

## 2016-10-18 ENCOUNTER — Telehealth: Payer: Self-pay

## 2016-10-18 MED ORDER — HYDROCODONE-ACETAMINOPHEN 10-325 MG PO TABS: ORAL_TABLET | ORAL | 0 refills | Status: DC

## 2016-10-18 MED ORDER — HYDROCHLOROTHIAZIDE 25 MG PO TABS: ORAL_TABLET | ORAL | 3 refills | Status: DC

## 2016-10-18 NOTE — Telephone Encounter (Signed)
Patient requested pain medication refill and will pick up. Also requested refill on her HCTZ.

## 2016-10-18 NOTE — Telephone Encounter (Signed)
I called patient to let her know that she has a prescription ready to be picked up at the office. Patient verbalized understanding. Rx was placed in filing cabinet at front desk.

## 2016-10-20 DIAGNOSIS — R1900 Intra-abdominal and pelvic swelling, mass and lump, unspecified site: Secondary | ICD-10-CM | POA: Diagnosis not present

## 2016-10-20 DIAGNOSIS — C187 Malignant neoplasm of sigmoid colon: Secondary | ICD-10-CM | POA: Diagnosis not present

## 2016-10-21 DIAGNOSIS — I480 Paroxysmal atrial fibrillation: Secondary | ICD-10-CM | POA: Diagnosis not present

## 2016-10-21 DIAGNOSIS — I10 Essential (primary) hypertension: Secondary | ICD-10-CM | POA: Diagnosis not present

## 2016-11-02 ENCOUNTER — Encounter: Payer: Self-pay | Admitting: Oncology

## 2016-11-02 ENCOUNTER — Telehealth: Payer: Self-pay | Admitting: Oncology

## 2016-11-02 NOTE — Telephone Encounter (Signed)
Appt has been scheduled for the pt to see Dr. Benay Spice on 5/15 at 2pm. Unable to reach the pt. Will mail the pt a letter and fax to the referring.

## 2016-11-04 ENCOUNTER — Telehealth: Payer: Self-pay | Admitting: Oncology

## 2016-11-04 NOTE — Telephone Encounter (Signed)
Requested  Cd from [facility] to be fedex'ed to Dr. Benay Spice

## 2016-11-10 DIAGNOSIS — I480 Paroxysmal atrial fibrillation: Secondary | ICD-10-CM | POA: Diagnosis not present

## 2016-11-15 ENCOUNTER — Telehealth: Payer: Self-pay | Admitting: Oncology

## 2016-11-15 ENCOUNTER — Ambulatory Visit (HOSPITAL_BASED_OUTPATIENT_CLINIC_OR_DEPARTMENT_OTHER): Payer: Medicare HMO | Admitting: Oncology

## 2016-11-15 VITALS — BP 168/72 | HR 69 | Temp 98.9°F | Resp 18 | Ht 65.0 in | Wt 132.2 lb

## 2016-11-15 DIAGNOSIS — Z853 Personal history of malignant neoplasm of breast: Secondary | ICD-10-CM | POA: Diagnosis not present

## 2016-11-15 DIAGNOSIS — C187 Malignant neoplasm of sigmoid colon: Secondary | ICD-10-CM | POA: Diagnosis not present

## 2016-11-15 DIAGNOSIS — I10 Essential (primary) hypertension: Secondary | ICD-10-CM | POA: Diagnosis not present

## 2016-11-15 DIAGNOSIS — J90 Pleural effusion, not elsewhere classified: Secondary | ICD-10-CM | POA: Diagnosis not present

## 2016-11-15 DIAGNOSIS — C7961 Secondary malignant neoplasm of right ovary: Secondary | ICD-10-CM

## 2016-11-15 NOTE — Telephone Encounter (Signed)
Gave patient AVS and calender per 5/15 los. [facility] to contact patient with CT schedule.  

## 2016-11-15 NOTE — Progress Notes (Signed)
[facility] Patient Consult   Referring MD: Estill Dooms, Md [address], [place] [address]   BENISHA HADAWAY 83 y.o.  11-04-1932    Reason for Referral: Metastatic colon cancer   HPI: Ms. Utley developed an abdominal mass in January 2018. She saw Dr. Nyoka Cowden. A CT of the abdomen and pelvis on 08/01/2016 revealed a tiny right pleural effusion. No liver mass. No pathologically enlarged lymph nodes. A large cystic and solid mass was seen in the right pelvis and lower abdomen suspicious for an ovarian tumor. Mild soft tissue stranding noted in the lower abdominal and pelvic omental fat. She was referred to gastroenterology and underwent an upper endoscopy 09/07/2016. Gastritis with hemorrhage was noted. A colonoscopy the same day revealed extrinsic compression versus intrinsic friable mucosa with severe stenosis was noted at the rectosigmoid colon and could not be traversed. Biopsies were obtained. A biopsy from the stomach revealed chronic gastritis, H. pylori positive. The biopsy from the rectosigmoid colon revealed intramucosal carcinoma.  She was referred to Dr. Clarene Essex [facility] was taken to the operating room on 09/30/2016. A mass was adherent to the sigmoid colon and posterior sacral peritoneum. The mass ruptured during manipulation. 2-3 subcentimeter nodules were noted and small bowel mesentery. A frozen section revealed a poorly differentiated carcinoma initially thought to represent a high-grade serous carcinoma. The sigmoid colon was resected. The rectal staple line was at 7 cm from the anal verge. 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 The sigmoid colon resection revealed a mucinous adenocarcinoma with tumor involving 2 of 15 lymph nodes. Tumor was present 1 mm from the radial margin. Lymphovascular and perineural invasion were present. There were 3 tumor deposits and tumor invaded the visceral peritoneum. Metastatic colon  adenocarcinoma was noted to involve the small bowel mesentery biopsy and omentum. There was intact expression of mismatch repair proteins. The tumor is MSI-stable.  She was referred to Dr. Altamease Oiler. She recommends observation and a restaging CT evaluation after further recovery from surgery. Past Medical History:  Diagnosis Date  . Anxiety state, unspecified   . Carpal tunnel syndrome   . Degeneration of intervertebral disc, site unspecified   . Depressive disorder, not elsewhere classified   . Dysphagia, unspecified(787.20)   . Edema   . Headache(784.0)   . Insomnia, unspecified   . Internal hemorrhoids   . Liver cyst   . Lumbago   . Malignant neoplasm of breast (female), unspecified site Acute 94   . Nonspecific elevation of levels of transaminase or lactic acid dehydrogenase (LDH)   . Osteoarthrosis, unspecified whether generalized or localized, unspecified site   . Otalgia, unspecified   . Other and unspecified hyperlipidemia   . Other chronic nonalcoholic liver disease   . Other malaise and fatigue   . Other malaise and fatigue   . Other nonspecific abnormal serum enzyme levels   . Pain in joint, lower leg   . Pain in joint, pelvic region and thigh   . Pain in joint, shoulder region   . Palpitations   . Reflux esophagitis   . Type II or unspecified type diabetes mellitus without mention of complication, not stated as uncontrolled   . Unspecified essential hypertension   . Unspecified vitamin D deficiency     .  G3 P3  Past Surgical History:  Procedure Laterality Date  . ABDOMINAL HYSTERECTOMY  1981   with appendix removal  . APPENDECTOMY     with hysterectomy  .  BREAST CYST EXCISION Left 1982  . BREAST LUMPECTOMY Left 1994   lymph cancer   . CATARACT EXTRACTION, BILATERAL  2016   [place]  . CHOLECYSTECTOMY  1987  . DILATION AND CURETTAGE OF UTERUS  1980  . LUMBAR DISC SURGERY  2003   L4-L5  . LUMBAR SPINE SURGERY  2002  . TOTAL HIP ARTHROPLASTY Bilateral 2003     Medications: Reviewed  Allergies:  Allergies  Allergen Reactions  . Daypro [Oxaprozin]   . Erythromycin   . Talwin [Pentazocine]     Family history: A brother had prostate cancer. A paternal niece had breast cancer. A paternal aunt had a brain tumor. No other family history of cancer  Social History:   She lives with her son in [place]. She is a Agricultural engineer. She does not use cigarettes or alcohol. She reports receiving a red cell transfusion at [facility]. No risk factor for HIV or hepatitis.  ROS:   Positives include: Anorexia, 40 pound weight loss, constipation relieved with prunes, rectal bleeding prior to surgery, "floaters" in the left eye, right knee pain  A complete ROS was otherwise negative.  Physical Exam:  There were no vitals taken for this visit.  HEENT: Upper denture plate, oropharynx without visible mass, neck without mass Lungs: Clear bilaterally Cardiac: Regular rate and rhythm Abdomen: No hepatosplenomegaly, no mass, nontender, healed midline incision Breasts: No mass in either breast. Status post left lumpectomy. No evidence for local tumor recurrence.  Vascular: No leg edema Lymph nodes: No cervical, supraclavicular, axillary, or inguinal nodes Neurologic: Alert and oriented, the motor exam appears intact in the upper and lower extremities Skin: No rash Musculoskeletal: No spine tenderness, mild swelling of the right knee   LAB:  CBC  Lab Results  Component Value Date   WBC 5.9 05/25/2016   HGB 13.4 05/25/2016   HCT 39.8 05/25/2016   MCV 92.3 05/25/2016   PLT 302 05/25/2016   NEUTROABS 3,422 05/25/2016        CMP     Component Value Date/Time   NA 140 05/25/2016 1615   NA 143 07/27/2015 0935   K 4.0 05/25/2016 1615   CL 105 05/25/2016 1615   CO2 23 05/25/2016 1615   GLUCOSE 110 (H) 05/25/2016 1615   BUN 17 05/25/2016 1615   BUN 18 07/27/2015 0935   CREATININE 0.97 (H) 05/25/2016 1615   CALCIUM 9.8 05/25/2016 1615   PROT 6.5  05/25/2016 1615   PROT 6.5 07/27/2015 0935   ALBUMIN 3.5 (L) 05/25/2016 1615   ALBUMIN 4.1 07/27/2015 0935   AST 22 05/25/2016 1615   ALT 10 05/25/2016 1615   ALKPHOS 48 05/25/2016 1615   BILITOT 0.8 05/25/2016 1615   BILITOT 0.7 07/27/2015 0935   GFRNONAA 52 (L) 01/18/2016 0921   GFRAA 60 01/18/2016 0921     Lab Results  Component Value Date   CEA1 172.52 (H) 08/29/2016    Imaging:  As per history of present illness, CT abdomen/pelvis 08/01/2016-images were reviewed with Mrs. Mahlum and her sister   Assessment/Plan:   1. Metastatic colon cancer  Sigmoid colectomy, right oophorectomy, omentectomy, and mesenteric implant biopsies at [facility] 09/30/2016  Sigmoid colon cancer (T4a,N1b,M1c), MSI-stable, mismatch repair protein expression intact  Metastatic colon cancer involving the omentum, right ovary, mesenteric nodule biopsies 2. Left breast cancer 1994  3.   Hypertension  4.   Arthritis   Disposition:   Ms. Spicer has been diagnosed with metastatic colon cancer. She presented with an abdominal/pelvic mass that  proved to be a right ovarian metastasis from a sigmoid colon primary tumor.  She underwent debulking of visible metastatic disease at the time of the sigmoid colectomy and right oophorectomy.  Ms. Jennel Mara is recovering from surgery. She saw Dr. Altamease Oiler and observation was recommended.  I discussed the surgical/pathology findings with Ms. Mamone and her sister. She understands no therapy will be curative. We decided to continue observation. She will return for a restaging CT evaluation, CEA, and office visit in approximately 2 weeks.  We will discuss systemic therapy options if she develops clinical or radiologic evidence of disease progression.  We will request [facility] testing on the resected colon tumor.  50 minutes were spent with the patient today. The majority of the time was used for counseling and coordination of care.  Betsy Coder, MD   11/15/2016, 2:17 PM

## 2016-11-16 ENCOUNTER — Encounter: Payer: Self-pay | Admitting: Gynecology

## 2016-11-18 DIAGNOSIS — I10 Essential (primary) hypertension: Secondary | ICD-10-CM | POA: Diagnosis not present

## 2016-11-18 DIAGNOSIS — I480 Paroxysmal atrial fibrillation: Secondary | ICD-10-CM | POA: Diagnosis not present

## 2016-11-24 ENCOUNTER — Telehealth: Payer: Self-pay

## 2016-11-24 NOTE — Telephone Encounter (Signed)
Sister Hassan Rowan called to get appt on 5/30 and 5/31 both changed to later in the day on their respective days, about 1 pm or later. In basket sent.

## 2016-11-30 ENCOUNTER — Encounter (HOSPITAL_COMMUNITY): Payer: Self-pay

## 2016-11-30 ENCOUNTER — Ambulatory Visit (HOSPITAL_COMMUNITY)
Admission: RE | Admit: 2016-11-30 | Discharge: 2016-11-30 | Disposition: A | Discharge status: Home / Self Care | Payer: Medicare HMO | Source: Ambulatory Visit | Attending: Oncology | Admitting: Oncology

## 2016-11-30 ENCOUNTER — Telehealth: Payer: Self-pay | Admitting: Oncology

## 2016-11-30 ENCOUNTER — Telehealth: Payer: Self-pay | Admitting: *Deleted

## 2016-11-30 ENCOUNTER — Other Ambulatory Visit: Payer: Medicare HMO

## 2016-11-30 ENCOUNTER — Other Ambulatory Visit (HOSPITAL_BASED_OUTPATIENT_CLINIC_OR_DEPARTMENT_OTHER): Payer: Medicare HMO

## 2016-11-30 DIAGNOSIS — R933 Abnormal findings on diagnostic imaging of other parts of digestive tract: Secondary | ICD-10-CM | POA: Insufficient documentation

## 2016-11-30 DIAGNOSIS — C187 Malignant neoplasm of sigmoid colon: Secondary | ICD-10-CM

## 2016-11-30 DIAGNOSIS — R599 Enlarged lymph nodes, unspecified: Secondary | ICD-10-CM | POA: Diagnosis not present

## 2016-11-30 DIAGNOSIS — C189 Malignant neoplasm of colon, unspecified: Secondary | ICD-10-CM | POA: Diagnosis not present

## 2016-11-30 LAB — COMPREHENSIVE METABOLIC PANEL
ALT: 24 U/L (ref 0–55)
ANION GAP: 6 meq/L (ref 3–11)
AST: 36 U/L — AB (ref 5–34)
Albumin: 3.6 g/dL (ref 3.5–5.0)
Alkaline Phosphatase: 49 U/L (ref 40–150)
BUN: 14.1 mg/dL (ref 7.0–26.0)
CALCIUM: 10.1 mg/dL (ref 8.4–10.4)
CHLORIDE: 107 meq/L (ref 98–109)
CO2: 29 mEq/L (ref 22–29)
Creatinine: 0.8 mg/dL (ref 0.6–1.1)
EGFR: 69 mL/min/{1.73_m2} — AB (ref >90)
Glucose: 97 mg/dl (ref 70–140)
POTASSIUM: 4.3 meq/L (ref 3.5–5.1)
Sodium: 142 mEq/L (ref 136–145)
Total Bilirubin: 0.72 mg/dL (ref 0.20–1.20)
Total Protein: 6.8 g/dL (ref 6.4–8.3)

## 2016-11-30 LAB — CBC WITH DIFFERENTIAL/PLATELET
BASO%: 0.3 % (ref 0.0–2.0)
BASOS ABS: 0 10*3/uL (ref 0.0–0.1)
EOS%: 1.9 % (ref 0.0–7.0)
Eosinophils Absolute: 0.1 10*3/uL (ref 0.0–0.5)
HEMATOCRIT: 34.5 % — AB (ref 34.8–46.6)
HGB: 11.3 g/dL — ABNORMAL LOW (ref 11.6–15.9)
LYMPH#: 2.3 10*3/uL (ref 0.9–3.3)
LYMPH%: 40.4 % (ref 14.0–49.7)
MCH: 30.4 pg (ref 25.1–34.0)
MCHC: 32.8 g/dL (ref 31.5–36.0)
MCV: 92.6 fL (ref 79.5–101.0)
MONO#: 0.3 10*3/uL (ref 0.1–0.9)
MONO%: 5.1 % (ref 0.0–14.0)
NEUT#: 3 10*3/uL (ref 1.5–6.5)
NEUT%: 52.3 % (ref 38.4–76.8)
PLATELETS: 173 10*3/uL (ref 145–400)
RBC: 3.72 10*6/uL (ref 3.70–5.45)
RDW: 15.7 % — ABNORMAL HIGH (ref 11.2–14.5)
WBC: 5.7 10*3/uL (ref 3.9–10.3)

## 2016-11-30 LAB — CEA (IN HOUSE-CHCC): CEA (CHCC-IN HOUSE): 8.2 ng/mL — AB (ref 0.00–5.00)

## 2016-11-30 MED ORDER — IOPAMIDOL (ISOVUE-300) INJECTION 61%
INTRAVENOUS | Status: AC
  Filled 2016-11-30: qty 100

## 2016-11-30 MED ORDER — IOPAMIDOL (ISOVUE-300) INJECTION 61%
100.0000 mL | Freq: Once | INTRAVENOUS | Status: AC | PRN
  Administered 2016-11-30: 100 mL via INTRAVENOUS

## 2016-11-30 NOTE — Telephone Encounter (Signed)
Received update via fax: [facility] is working on obtaining specimen from [facility] pathology. (They began working on this 5/23.)

## 2016-11-30 NOTE — Telephone Encounter (Signed)
R/s appt per patient - patient wanted a later time. Patient aware of time change.

## 2016-12-01 ENCOUNTER — Telehealth: Payer: Self-pay | Admitting: Nurse Practitioner

## 2016-12-01 ENCOUNTER — Ambulatory Visit: Payer: Medicare HMO | Admitting: Oncology

## 2016-12-01 ENCOUNTER — Ambulatory Visit (HOSPITAL_BASED_OUTPATIENT_CLINIC_OR_DEPARTMENT_OTHER): Payer: Medicare HMO | Admitting: Nurse Practitioner

## 2016-12-01 VITALS — BP 164/66 | HR 59 | Temp 98.3°F | Resp 18 | Ht 65.0 in | Wt 137.5 lb

## 2016-12-01 DIAGNOSIS — C7961 Secondary malignant neoplasm of right ovary: Secondary | ICD-10-CM | POA: Diagnosis not present

## 2016-12-01 DIAGNOSIS — C187 Malignant neoplasm of sigmoid colon: Secondary | ICD-10-CM

## 2016-12-01 DIAGNOSIS — I10 Essential (primary) hypertension: Secondary | ICD-10-CM

## 2016-12-01 DIAGNOSIS — C189 Malignant neoplasm of colon, unspecified: Secondary | ICD-10-CM

## 2016-12-01 NOTE — Telephone Encounter (Signed)
Gave patient AVS and calender per 5/31 los. 

## 2016-12-01 NOTE — Progress Notes (Addendum)
[place] OFFICE PROGRESS NOTE   Diagnosis:  Metastatic colon cancer  INTERVAL HISTORY:   Tamara Yang returns as scheduled. She feels well. She has a good appetite and good energy level. She is gaining weight. No fever or chills. No abdominal pain. She has occasional "burning" low back pain. The pain resolves with a heating pad. She denies any bleeding. No shortness of breath.  Objective:  Vital signs in last 24 hours:  Blood pressure (!) 164/66, pulse (!) 59, temperature 98.3 F (36.8 C), temperature source Oral, resp. rate 18, height _0  (1.651 m), weight 137 lb 8 oz (62.4 kg), SpO2 99 %.    HEENT: No thrush or ulcers. Lymphatics: No palpable cervical, supra clavicular, axillary or inguinal lymph nodes. Resp: Lungs clear bilaterally. Cardio: Regular rate and rhythm. GI: Abdomen soft and nontender. No hepatomegaly. No mass. Vascular: No leg edema.   Lab Results:  Lab Results  Component Value Date   WBC 5.7 11/30/2016   HGB 11.3 (L) 11/30/2016   HCT 34.5 (L) 11/30/2016   MCV 92.6 11/30/2016   PLT 173 11/30/2016   NEUTROABS 3.0 11/30/2016    Imaging:  Ct Chest W Contrast  Result Date: 12/01/2016 CLINICAL DATA:  Colon cancer diagnosis 09/02/2016. EXAM: CT CHEST, ABDOMEN, AND PELVIS WITH CONTRAST TECHNIQUE: Multidetector CT imaging of the chest, abdomen and pelvis was performed following the standard protocol during bolus administration of intravenous contrast. CONTRAST:  152m ISOVUE-300 IOPAMIDOL (ISOVUE-300) INJECTION 61% COMPARISON:  CT 08/01/2016 FINDINGS: CT CHEST FINDINGS Cardiovascular: No significant vascular findings. Normal heart size. No pericardial effusion. Mediastinum/Nodes: RIGHT axillary nodal dissection. No mediastinal or hilar lymphadenopathy. Lungs/Pleura: No suspicious pulmonary nodules. Musculoskeletal: No aggressive osseous lesion. CT ABDOMEN AND PELVIS FINDINGS Hepatobiliary: Low-density lesions in the liver simple fluid attenuation  consistent benign cysts. Post cholecystectomy. Pancreas: Pancreas is normal. No ductal dilatation. No pancreatic inflammation. Spleen: Normal spleen Adrenals/urinary tract: Adrenal glands and kidneys are normal. The ureters and bladder normal. Stomach/Bowel: Stomach, small-bowel cecum normal. Appendix not identified. Contrast travels the entirety of the colon to the descending colon. There is a anastomosis in the rectosigmoid junction. Posterior to anastomosis, there is a fluid collection measuring 3.3 x 2.8 cm (image 101, series 2). Fluid collection along the presacral space immediately adjacent to the anastomosis. The fluid collection measures 6.4 cm in craniocaudad dimension in the presacral space. There is a surgical clip in the posterior RIGHT aspect of the fluid collection. Vascular/Lymphatic: Abdominal aorta is normal caliber with atherosclerotic calcification. There is no retroperitoneal or periportal lymphadenopathy. No pelvic lymphadenopathy. 5 mm lymph node RIGHT and the IVC (image 81, series 2). Reproductive: Post hysterectomy anatomy Other: No peritoneal metastasis Musculoskeletal: Bilateral hip prosthetics. No aggressive osseous lesion. IMPRESSION: Chest Impression: 1. No evidence thoracic metastasis. Abdomen / Pelvis Impression: 1. No evidence of metastatic disease in the abdomen or pelvis. 2. A 5 mm lymph node RIGHT of the IVC noted. 3. Postsurgical fluid collection in the presacral space immediately adjacent to the rectal anastomosis with an air-fluid level. Recommend clinical correlation with signs or symptoms of infection. 4. Mid rectum anastomosis. Electronically Signed   By: SSuzy BouchardM.D.   On: 12/01/2016 09:21   Ct Abdomen Pelvis W Contrast  Result Date: 12/01/2016 CLINICAL DATA:  Colon cancer diagnosis 09/02/2016. EXAM: CT CHEST, ABDOMEN, AND PELVIS WITH CONTRAST TECHNIQUE: Multidetector CT imaging of the chest, abdomen and pelvis was performed following the standard protocol  during bolus administration of intravenous contrast. CONTRAST:  1070mISOVUE-300  IOPAMIDOL (ISOVUE-300) INJECTION 61% COMPARISON:  CT 08/01/2016 FINDINGS: CT CHEST FINDINGS Cardiovascular: No significant vascular findings. Normal heart size. No pericardial effusion. Mediastinum/Nodes: RIGHT axillary nodal dissection. No mediastinal or hilar lymphadenopathy. Lungs/Pleura: No suspicious pulmonary nodules. Musculoskeletal: No aggressive osseous lesion. CT ABDOMEN AND PELVIS FINDINGS Hepatobiliary: Low-density lesions in the liver simple fluid attenuation consistent benign cysts. Post cholecystectomy. Pancreas: Pancreas is normal. No ductal dilatation. No pancreatic inflammation. Spleen: Normal spleen Adrenals/urinary tract: Adrenal glands and kidneys are normal. The ureters and bladder normal. Stomach/Bowel: Stomach, small-bowel cecum normal. Appendix not identified. Contrast travels the entirety of the colon to the descending colon. There is a anastomosis in the rectosigmoid junction. Posterior to anastomosis, there is a fluid collection measuring 3.3 x 2.8 cm (image 101, series 2). Fluid collection along the presacral space immediately adjacent to the anastomosis. The fluid collection measures 6.4 cm in craniocaudad dimension in the presacral space. There is a surgical clip in the posterior RIGHT aspect of the fluid collection. Vascular/Lymphatic: Abdominal aorta is normal caliber with atherosclerotic calcification. There is no retroperitoneal or periportal lymphadenopathy. No pelvic lymphadenopathy. 5 mm lymph node RIGHT and the IVC (image 81, series 2). Reproductive: Post hysterectomy anatomy Other: No peritoneal metastasis Musculoskeletal: Bilateral hip prosthetics. No aggressive osseous lesion. IMPRESSION: Chest Impression: 1. No evidence thoracic metastasis. Abdomen / Pelvis Impression: 1. No evidence of metastatic disease in the abdomen or pelvis. 2. A 5 mm lymph node RIGHT of the IVC noted. 3. Postsurgical  fluid collection in the presacral space immediately adjacent to the rectal anastomosis with an air-fluid level. Recommend clinical correlation with signs or symptoms of infection. 4. Mid rectum anastomosis. Electronically Signed   By: Suzy Bouchard M.D.   On: 12/01/2016 09:21    Medications: I have reviewed the patient's current medications.  Assessment/Plan: 1. Metastatic colon cancer ? Sigmoid colectomy, right oophorectomy, omentectomy, and mesenteric implant biopsies at [facility] 09/30/2016 ? Sigmoid colon cancer (T4a,N1b,M1c), MSI-stable, mismatch repair protein expression intact ? Metastatic colon cancer involving the omentum, right ovary, mesenteric nodule biopsies\ ? CT chest/abdomen/pelvis 12/01/2011-no evidence of metastatic disease in the chest, abdomen, pelvis. 5 mm lymph node right of the IVC. Postsurgical fluid collection in the presacral space immediately adjacent to the rectal anastomosis with an air-fluid level. 2. Left breast cancer 1994  3.   Hypertension  4.   Arthritis   Disposition: Tamara Yang appears stable. The CEA is improved but remains mildly elevated. The recent restaging CT scans shows no evidence of metastatic disease. A fluid collection was noted in the presacral space adjacent to the rectal anastomosis. She has no signs or symptoms to suggest an abscess. The plan is to continue to follow with observation. She will return for a follow-up visit and CEA in 2 months. She will contact the office in the interim with any problems. We specifically discussed fever, chills, pain.  Patient seen with Dr. Benay Spice.  Ned Card ANP/GNP-BC   12/01/2016  1:04 PM  This was a shared visit with Ned Card. We reviewed the CT findings with Tamara Yang. There is no measurable disease. The CEA is mildly elevated. She understands the metastatic colon cancer will progress in the future. The plan is to continue observation. She will return for an office visit in 2 months.  Julieanne Manson, M.D.

## 2016-12-14 ENCOUNTER — Ambulatory Visit (INDEPENDENT_AMBULATORY_CARE_PROVIDER_SITE_OTHER): Payer: Medicare HMO | Admitting: Internal Medicine

## 2016-12-14 ENCOUNTER — Encounter: Payer: Self-pay | Admitting: Internal Medicine

## 2016-12-14 VITALS — BP 162/86 | HR 48 | Temp 98.3°F | Ht 65.0 in | Wt 137.0 lb

## 2016-12-14 DIAGNOSIS — C189 Malignant neoplasm of colon, unspecified: Secondary | ICD-10-CM | POA: Diagnosis not present

## 2016-12-14 DIAGNOSIS — I480 Paroxysmal atrial fibrillation: Secondary | ICD-10-CM

## 2016-12-14 DIAGNOSIS — I10 Essential (primary) hypertension: Secondary | ICD-10-CM

## 2016-12-14 DIAGNOSIS — G8929 Other chronic pain: Secondary | ICD-10-CM

## 2016-12-14 DIAGNOSIS — M546 Pain in thoracic spine: Secondary | ICD-10-CM

## 2016-12-14 DIAGNOSIS — E785 Hyperlipidemia, unspecified: Secondary | ICD-10-CM | POA: Diagnosis not present

## 2016-12-14 DIAGNOSIS — E119 Type 2 diabetes mellitus without complications: Secondary | ICD-10-CM

## 2016-12-14 DIAGNOSIS — R51 Headache: Secondary | ICD-10-CM | POA: Diagnosis not present

## 2016-12-14 DIAGNOSIS — R519 Headache, unspecified: Secondary | ICD-10-CM

## 2016-12-14 DIAGNOSIS — F419 Anxiety disorder, unspecified: Secondary | ICD-10-CM

## 2016-12-14 DIAGNOSIS — R69 Illness, unspecified: Secondary | ICD-10-CM | POA: Diagnosis not present

## 2016-12-14 DIAGNOSIS — M549 Dorsalgia, unspecified: Secondary | ICD-10-CM | POA: Insufficient documentation

## 2016-12-14 MED ORDER — METOPROLOL SUCCINATE ER 50 MG PO TB24: ORAL_TABLET | ORAL | 3 refills | Status: AC

## 2016-12-14 MED ORDER — HYDROCODONE-ACETAMINOPHEN 10-325 MG PO TABS: ORAL_TABLET | ORAL | 0 refills | Status: DC

## 2016-12-14 MED ORDER — LORAZEPAM 1 MG PO TABS: ORAL_TABLET | ORAL | 1 refills | Status: DC

## 2016-12-14 MED ORDER — AMLODIPINE BESYLATE 5 MG PO TABS: ORAL_TABLET | ORAL | 3 refills | Status: DC

## 2016-12-14 MED ORDER — BUTALBITAL-APAP-CAFFEINE 50-325-40 MG PO TABS: ORAL_TABLET | ORAL | 1 refills | Status: DC

## 2016-12-14 MED ORDER — ENALAPRIL MALEATE 20 MG PO TABS: ORAL_TABLET | ORAL | 3 refills | Status: DC

## 2016-12-14 MED ORDER — ELIQUIS 5 MG PO TABS: ORAL_TABLET | ORAL | 3 refills | Status: AC

## 2016-12-14 NOTE — Progress Notes (Signed)
Facility  [facility]    Place of Service:   OFFICE    Allergies  Allergen Reactions  . Daypro [Oxaprozin]   . Erythromycin   . Talwin [Pentazocine]     Chief Complaint  Patient presents with  . Medical Management of Chronic Issues    2 month medication management blood pressure, blood sugar, A-Fib. Here with sister Tamara Yang     HPI:   Burning in the right flank. Heating pad helps. Hurts with certain movements. Vicodin helps  Type 2 diabetes mellitus without complication, without long-term current use of insulin (HCC) - Last glucose was normal at 97  Nonintractable headache, unspecified chronicity pattern, unspecified headache type - [name] helps  Essential hypertension - Systolic BP is running high. Was taken off amlodipine in March 2018.  Hyperlipidemia, unspecified hyperlipidemia type - needs follow up  PAF (paroxysmal atrial fibrillation) ([place]) - anticaoagulated  Malignant neoplasm of colon, unspecified part of colon ([name]) - Seen by Dr. Benay Spice on 12/01/16. Remains on observation for her metastatic colon cancer. Weight 146 on 10/05/16, now 137. Has been as low as 132#.   Medications: Patient's Medications  New Prescriptions   No medications on file  Previous Medications   BUTALBITAL-ACETAMINOPHEN-CAFFEINE (FIORICET) 50-325-40 MG TABLET    Take one or 2 tablets every 6 hours if needed for headache   CHOLECALCIFEROL (VITAMIN D) 1000 UNITS TABLET    Take two tablets once daily   CYCLOBENZAPRINE (FLEXERIL) 10 MG TABLET    One up to 3 times daily as needed to help muscle spasm and pain   ELIQUIS 5 MG TABS TABLET    Take 1 tablet by mouth daily.   ENALAPRIL (VASOTEC) 20 MG TABLET    TAKE 1 TABLET BY MOUTH TWICE DAILY FOR BLOOD PRESSURE   HYDROCODONE-ACETAMINOPHEN (NORCO) 10-325 MG TABLET    Take one tablet by mouth up to four times a day as needed for pain.   LOPERAMIDE (IMODIUM A-D) 2 MG TABLET    Take one tablet by mouth with each loose stool up to 8 tablets in 24 hours   LORAZEPAM (ATIVAN) 1 MG TABLET    Take one tablet by mouth every 4 hours as needed for anxiety   METOPROLOL SUCCINATE (TOPROL-XL) 50 MG 24 HR TABLET    TAKE 1 TABLET BY MOUTH TWICE DAILY FOR BLOOD PRESSURE   OMEGA-3 FATTY ACIDS (FISH OIL PO)    Take 1,040 Units by mouth. Take one tablet once daily   OMEPRAZOLE (PRILOSEC) 20 MG CAPSULE    20 mg. Take one tablet at bedtime to reduce stomach acid and to help heartburn.  Modified Medications   No medications on file  Discontinued Medications   No medications on file    Review of Systems  Constitutional: Negative for activity change, appetite change, fatigue, fever and unexpected weight change.  HENT: Positive for hearing loss and tinnitus. Negative for sore throat.   Eyes:       Corrective lenses. Normal diabetic eye exam by Dr. [name] 07/13/2010. Cataract surgery bilaterally in June 2016.  Respiratory: Positive for cough.   Cardiovascular: Positive for leg swelling (right).  Gastrointestinal: Positive for diarrhea.       Colon cancer with metastasis to abd cavity and ovary  Endocrine:       History of diabetes  Genitourinary: Negative.   Musculoskeletal: Positive for arthralgias, back pain, neck pain and neck stiffness.       Diffuse joint pains. Bilateral shoulder pain R>L. Generalized arthritis is  present. Has right knee pains. Gait stable.  Skin: Negative.   Allergic/Immunologic: Negative.   Neurological: Positive for headaches. Negative for dizziness, tremors, seizures, facial asymmetry, speech difficulty, weakness and numbness.  Hematological: Negative.   Psychiatric/Behavioral: Negative.        "Feeling tremulous inside"    Vitals:   12/14/16 1505  BP: (!) 162/86  Pulse: (!) 48  Temp: 98.3 F (36.8 C)  TempSrc: Oral  SpO2: 97%  Weight: 137 lb (62.1 kg)  Height: _0  (1.651 m)   Body mass index is 22.8 kg/m. Wt Readings from Last 3 Encounters:  12/14/16 137 lb (62.1 kg)  12/01/16 137 lb 8 oz (62.4 kg)  11/15/16 132  lb 3.2 oz (60 kg)      Physical Exam  Constitutional: She is oriented to person, place, and time. She appears well-developed and well-nourished. No distress.  HENT:  Head: Normocephalic and atraumatic.  Bilateral partial deafness.  Eyes: Conjunctivae and EOM are normal. Pupils are equal, round, and reactive to light. Left eye exhibits no discharge. No scleral icterus.  Neck: Normal range of motion. Neck supple. No JVD present. No tracheal deviation present. No thyromegaly present.  Cardiovascular: Normal rate, regular rhythm, normal heart sounds and intact distal pulses.  Exam reveals no gallop and no friction rub.   No murmur heard. Pulmonary/Chest: Effort normal and breath sounds normal. No respiratory distress. She has no wheezes. She has no rales.  Abdominal: Soft. Bowel sounds are normal. She exhibits no distension. There is tenderness in the right lower quadrant.  Healed midline incision from xiphoid to pubis.  Genitourinary:  Genitourinary Comments: HX small internal hemorrhoid  Musculoskeletal: She exhibits tenderness. She exhibits no edema (1+ right lower leg).  Reduced ability to raise her arms. Bilateral shoulder pain. Right knee pains. Right knee effusion.  Lymphadenopathy:    She has no cervical adenopathy.  Neurological: She is alert and oriented to person, place, and time. No cranial nerve deficit. Coordination normal.  01/20/16 MMSE 30/30  Skin: Skin is warm and dry. No rash noted. She is not diaphoretic. No erythema. No pallor.  Psychiatric: She has a normal mood and affect. Her behavior is normal. Judgment and thought content normal.    Labs reviewed: Lab Summary Latest Ref Rng & Units 11/30/2016 09/13/2016  Hemoglobin 11.6 - 15.9 g/dL 11.3(L) (None)  Hematocrit 34.8 - 46.6 % 34.5(L) (None)  White count 3.9 - 10.3 10e3/uL 5.7 (None)  Platelet count 145 - 400 10e3/uL 173 (None)  Sodium 136 - 145 mEq/L 142 (None)  Potassium 3.5 - 5.1 mEq/L 4.3 (None)  Calcium 8.4 -  10.4 mg/dL 10.1 (None)  Phosphorus - (None) (None)  Creatinine 0.6 - 1.1 mg/dL 0.8 (None)  AST 5 - 34 U/L 36(H) (None)  Alk Phos 40 - 150 U/L 49 (None)  Bilirubin 0.20 - 1.20 mg/dL 0.72 (None)  Glucose 70 - 140 mg/dl 97 (None)  Cholesterol <200 mg/dL (None) 187  HDL cholesterol >50 mg/dL (None) 47(L)  Triglycerides <150 mg/dL (None) 105  LDL Direct - (None) (None)  LDL Calc <100 mg/dL (None) 119(H)  Total protein 6.4 - 8.3 g/dL 6.8 (None)  Albumin 3.5 - 5.0 g/dL 3.6 (None)  Some recent data might be hidden   Lab Results  Component Value Date   TSH 2.51 05/25/2016   Lab Results  Component Value Date   BUN 14.1 11/30/2016   BUN 17 05/25/2016   BUN 18 01/18/2016   Lab Results  Component Value Date  HGBA1C 5.0 09/13/2016   HGBA1C 6.0 (H) 01/18/2016   HGBA1C 6.1 (H) 07/27/2015    Assessment/Plan  1. Nonintractable headache, unspecified chronicity pattern, unspecified headache type - butalbital-acetaminophen-caffeine (FIORICET) 50-325-40 MG tablet; Take one or 2 tablets every 6 hours if needed for headache  Dispense: 100 tablet; Refill: 1  2. Essential hypertension - enalapril (VASOTEC) 20 MG tablet; TAKE 1 TABLET BY MOUTH TWICE DAILY FOR BLOOD PRESSURE  Dispense: 180 tablet; Refill: 3 - metoprolol succinate (TOPROL-XL) 50 MG 24 hr tablet; TAKE 1 TABLET BY MOUTH TWICE DAILY FOR BLOOD PRESSURE  Dispense: 180 tablet; Refill: 3 - amLODipine (NORVASC) 5 MG tablet; One daily to help control BP  Dispense: 90 tablet; Refill: 3 - Comprehensive metabolic panel; Future  3. Type 2 diabetes mellitus without complication, without long-term current use of insulin (HCC) - Comprehensive metabolic panel; Future - Hemoglobin A1c; Future  4. Hyperlipidemia, unspecified hyperlipidemia type - Lipid panel; Future  5. PAF (paroxysmal atrial fibrillation) (HCC) anticoagulated  6. Malignant neoplasm of colon, unspecified part of colon ([place]) Continue with Dr. Benay Spice  7. Anxiety -  LORazepam (ATIVAN) 1 MG tablet; Take one tablet by mouth every 4 hours as needed for anxiety  Dispense: 180 tablet; Refill: 1  8. Chronic right-sided thoracic back pain - HYDROcodone-acetaminophen (NORCO) 10-325 MG tablet; Take one tablet by mouth up to four times a day as needed for pain.  Dispense: 120 tablet; Refill: 0

## 2016-12-20 DIAGNOSIS — C772 Secondary and unspecified malignant neoplasm of intra-abdominal lymph nodes: Secondary | ICD-10-CM | POA: Diagnosis not present

## 2016-12-20 DIAGNOSIS — C19 Malignant neoplasm of rectosigmoid junction: Secondary | ICD-10-CM | POA: Diagnosis not present

## 2016-12-21 ENCOUNTER — Telehealth: Payer: Self-pay | Admitting: *Deleted

## 2016-12-21 NOTE — Telephone Encounter (Signed)
PA required for Fioricet. Completed via Cover My Meds.

## 2016-12-21 NOTE — Telephone Encounter (Signed)
PA approved and pharmacy notified. 

## 2016-12-28 NOTE — Addendum Note (Signed)
Addended by: Royann Shivers A on: 12/28/2016 03:41 PM   Modules accepted: Orders

## 2017-01-24 ENCOUNTER — Ambulatory Visit (HOSPITAL_BASED_OUTPATIENT_CLINIC_OR_DEPARTMENT_OTHER): Payer: Medicare HMO | Admitting: Nurse Practitioner

## 2017-01-24 ENCOUNTER — Telehealth: Payer: Self-pay | Admitting: Nurse Practitioner

## 2017-01-24 ENCOUNTER — Other Ambulatory Visit (HOSPITAL_BASED_OUTPATIENT_CLINIC_OR_DEPARTMENT_OTHER): Payer: Medicare HMO

## 2017-01-24 VITALS — BP 170/62 | HR 57 | Temp 98.1°F | Resp 18 | Ht 65.0 in | Wt 139.8 lb

## 2017-01-24 DIAGNOSIS — C189 Malignant neoplasm of colon, unspecified: Secondary | ICD-10-CM

## 2017-01-24 DIAGNOSIS — I10 Essential (primary) hypertension: Secondary | ICD-10-CM | POA: Diagnosis not present

## 2017-01-24 DIAGNOSIS — Z853 Personal history of malignant neoplasm of breast: Secondary | ICD-10-CM

## 2017-01-24 DIAGNOSIS — C187 Malignant neoplasm of sigmoid colon: Secondary | ICD-10-CM

## 2017-01-24 LAB — CEA (IN HOUSE-CHCC): CEA (CHCC-IN HOUSE): 19.32 ng/mL — AB (ref 0.00–5.00)

## 2017-01-24 NOTE — Telephone Encounter (Signed)
Scheduled appt per 7/24 los - Gave patient AVS and calender per los. - GBS not available 9/4

## 2017-01-24 NOTE — Progress Notes (Addendum)
  [place] OFFICE PROGRESS NOTE   Diagnosis:  Metastatic colon cancer  INTERVAL HISTORY:   Ms. Corea returns as scheduled. She overall feels well. She reports a good appetite. No abdominal pain. No nausea or vomiting. No diarrhea. She is gaining weight. She reports dizziness at nighttime since beginning Eliquis. No headache or visual disturbance.  Objective:  Vital signs in last 24 hours:  Blood pressure (!) 170/62, pulse (!) 57, temperature 98.1 F (36.7 C), temperature source Oral, resp. rate 18, height _0  (1.651 m), weight 139 lb 12.8 oz (63.4 kg), SpO2 98 %.    HEENT: Neck without mass. Lymphatics: No palpable cervical, supraclavicular, axillary or inguinal lymph nodes. Resp: Lungs clear bilaterally. Cardio: Regular rate and rhythm. GI: Abdomen soft and nontender. No hepatosplenomegaly. No mass. No apparent ascites. Vascular: No leg edema.   Lab Results:  Lab Results  Component Value Date   WBC 5.7 11/30/2016   HGB 11.3 (L) 11/30/2016   HCT 34.5 (L) 11/30/2016   MCV 92.6 11/30/2016   PLT 173 11/30/2016   NEUTROABS 3.0 11/30/2016    Imaging:  No results found.  Medications: I have reviewed the patient's current medications.  Assessment/Plan: 1. Metastatic colon cancer ? Sigmoid colectomy, right oophorectomy, omentectomy, and mesenteric implant biopsies at [facility] 09/30/2016 ? Sigmoid colon cancer (T4a,N1b,M1c), MSI-stable, mismatch repair protein expression intact ? Metastatic colon cancer involving the omentum, right ovary, mesenteric nodule biopsies\ ? CT chest/abdomen/pelvis 11/30/2016-no evidence of metastatic disease in the chest, abdomen, pelvis. 5 mm lymph node right of the IVC. Postsurgical fluid collection in the presacral space immediately adjacent to the rectal anastomosis with an air-fluid level. ? Foundation 1-no RAS mutation; ERBB2 amplification; microsatellite stable; low tumor mutational burden 2. Left breast cancer 1994  3.  Hypertension  4. Arthritis   Disposition:Ms. Millirons appears stable. There is no clinical evidence of disease progression. We will follow-up on the CEA from today. The plan is for restaging CT scans at an approximate four-month interval from the previous cans.  She will return for labs and a follow-up visit in 6 weeks. She will contact the office in the interim with any problems. She will contact her PCP regarding the nighttime dizziness.  Plan reviewed with Dr. Benay Spice.  Ned Card ANP/GNP-BC   01/24/2017  2:32 PM

## 2017-02-06 ENCOUNTER — Other Ambulatory Visit: Payer: Self-pay | Admitting: *Deleted

## 2017-02-06 DIAGNOSIS — M546 Pain in thoracic spine: Principal | ICD-10-CM

## 2017-02-06 DIAGNOSIS — G8929 Other chronic pain: Secondary | ICD-10-CM

## 2017-02-06 MED ORDER — HYDROCODONE-ACETAMINOPHEN 10-325 MG PO TABS: ORAL_TABLET | ORAL | 0 refills | Status: DC

## 2017-02-06 NOTE — Telephone Encounter (Signed)
Patient requested and will pick up 

## 2017-03-07 ENCOUNTER — Other Ambulatory Visit: Payer: Medicare HMO

## 2017-03-07 ENCOUNTER — Telehealth: Payer: Self-pay | Admitting: Nurse Practitioner

## 2017-03-07 ENCOUNTER — Ambulatory Visit (HOSPITAL_BASED_OUTPATIENT_CLINIC_OR_DEPARTMENT_OTHER): Payer: Medicare HMO | Admitting: Nurse Practitioner

## 2017-03-07 VITALS — BP 180/78 | HR 58 | Temp 98.3°F | Resp 16 | Ht 65.0 in | Wt 144.0 lb

## 2017-03-07 DIAGNOSIS — I10 Essential (primary) hypertension: Secondary | ICD-10-CM | POA: Diagnosis not present

## 2017-03-07 DIAGNOSIS — C7961 Secondary malignant neoplasm of right ovary: Secondary | ICD-10-CM | POA: Diagnosis not present

## 2017-03-07 DIAGNOSIS — C187 Malignant neoplasm of sigmoid colon: Secondary | ICD-10-CM | POA: Diagnosis not present

## 2017-03-07 DIAGNOSIS — C189 Malignant neoplasm of colon, unspecified: Secondary | ICD-10-CM

## 2017-03-07 NOTE — Telephone Encounter (Signed)
Gave patient AVS and calendar of upcoming September appointments.  °

## 2017-03-07 NOTE — Progress Notes (Signed)
  [place] OFFICE PROGRESS NOTE   Diagnosis:  Metastatic colon cancer  INTERVAL HISTORY:   Tamara Yang returns as scheduled. She overall is feeling well. She has a good appetite. She is gaining weight. Bowels moving regularly. No abdominal pain. She continues to have bilateral knee and left shoulder pain. She thinks the pain is related to arthritis. She denies headache. No shortness of breath or chest pain.  Objective:  Vital signs in last 24 hours:  Blood pressure (!) 180/78, pulse (!) 58, temperature 98.3 F (36.8 C), temperature source Oral, resp. rate 16, height '5\' 5"'$  (1.651 m), weight 144 lb (65.3 kg), SpO2 100 %.    HEENT: Neck without mass. Lymphatics: No palpable cervical, supraclavicular, axillary or inguinal lymph nodes. Resp: Lungs clear bilaterally. Cardio: Regular rate and rhythm. GI: Abdomen soft and nontender. No hepatomegaly. No mass. Vascular: No leg edema.   Lab Results:  Lab Results  Component Value Date   WBC 5.7 11/30/2016   HGB 11.3 (L) 11/30/2016   HCT 34.5 (L) 11/30/2016   MCV 92.6 11/30/2016   PLT 173 11/30/2016   NEUTROABS 3.0 11/30/2016    Imaging:  No results found.  Medications: I have reviewed the patient's current medications.  Assessment/Plan: 1. Metastatic colon cancer ? Sigmoid colectomy, right oophorectomy, omentectomy, and mesenteric implant biopsies at [facility] 09/30/2016 ? Sigmoid colon cancer (T4a,N1b,M1c), MSI-stable, mismatch repair protein expression intact ? Metastatic colon cancer involving the omentum, right ovary, mesenteric nodule biopsies\ ? CT chest/abdomen/pelvis 11/30/2016-no evidence of metastatic disease in the chest, abdomen, pelvis. 5 mm lymph node right of the IVC. Postsurgical fluid collection in the presacral space immediately adjacent to the rectal anastomosis with an air-fluid level. ? Foundation 1-no RAS mutation; ERBB2 amplification; microsatellite stable; low tumor mutational burden 2. Left  breast cancer 1994  3. Hypertension  4. Arthritis    Disposition:Tamara Yang appears stable. There is no clinical evidence of disease progression. We will follow-up on the CEA from today. We are referring her for restaging CT scans in the next 3-4 weeks which will be at a four-month interval from the previous scans.  We discussed the elevated blood pressure. She is on multiple blood pressure medications. She will check her blood pressure outside of our office and follow up with her PCP if the blood pressure remains elevated.  She will return for a follow-up visit 03/30/2017 to review the CT results. She will contact the office in the interim with any problems.    Ned Card ANP/GNP-BC   03/07/2017  3:02 PM

## 2017-03-13 ENCOUNTER — Other Ambulatory Visit: Payer: Self-pay

## 2017-03-13 ENCOUNTER — Other Ambulatory Visit: Payer: Medicare HMO

## 2017-03-13 DIAGNOSIS — I10 Essential (primary) hypertension: Secondary | ICD-10-CM | POA: Diagnosis not present

## 2017-03-13 DIAGNOSIS — E785 Hyperlipidemia, unspecified: Secondary | ICD-10-CM

## 2017-03-13 DIAGNOSIS — C189 Malignant neoplasm of colon, unspecified: Secondary | ICD-10-CM

## 2017-03-13 DIAGNOSIS — E119 Type 2 diabetes mellitus without complications: Secondary | ICD-10-CM | POA: Diagnosis not present

## 2017-03-13 LAB — LIPID PANEL
Cholesterol: 158 mg/dL (ref <200)
HDL: 62 mg/dL (ref >50)
LDL Cholesterol (Calc): 79 mg/dL (calc)
NON-HDL CHOLESTEROL (CALC): 96 mg/dL (ref <130)
Total CHOL/HDL Ratio: 2.5 (calc) (ref <5.0)
Triglycerides: 87 mg/dL (ref <150)

## 2017-03-13 LAB — COMPLETE METABOLIC PANEL WITH GFR
AG Ratio: 1.4 (calc) (ref 1.0–2.5)
ALT: 25 U/L (ref 6–29)
AST: 27 U/L (ref 10–35)
Albumin: 3.8 g/dL (ref 3.6–5.1)
Alkaline phosphatase (APISO): 55 U/L (ref 33–130)
BUN: 14 mg/dL (ref 7–25)
CALCIUM: 9.9 mg/dL (ref 8.6–10.4)
CO2: 27 mmol/L (ref 20–32)
Chloride: 108 mmol/L (ref 98–110)
Creat: 0.81 mg/dL (ref 0.60–0.88)
GFR, EST AFRICAN AMERICAN: 79 mL/min/{1.73_m2} (ref >=60)
GFR, EST NON AFRICAN AMERICAN: 68 mL/min/{1.73_m2} (ref >=60)
GLUCOSE: 95 mg/dL (ref 65–99)
Globulin: 2.7 g/dL (calc) (ref 1.9–3.7)
Potassium: 4.1 mmol/L (ref 3.5–5.3)
Sodium: 141 mmol/L (ref 135–146)
TOTAL PROTEIN: 6.5 g/dL (ref 6.1–8.1)
Total Bilirubin: 0.8 mg/dL (ref 0.2–1.2)

## 2017-03-14 LAB — HEMOGLOBIN A1C
HEMOGLOBIN A1C: 5.6 %{Hb} (ref <5.7)
MEAN PLASMA GLUCOSE: 114 (calc)
eAG (mmol/L): 6.3 (calc)

## 2017-03-16 ENCOUNTER — Encounter: Payer: Self-pay | Admitting: Nurse Practitioner

## 2017-03-16 ENCOUNTER — Ambulatory Visit (INDEPENDENT_AMBULATORY_CARE_PROVIDER_SITE_OTHER): Payer: Medicare HMO | Admitting: Nurse Practitioner

## 2017-03-16 VITALS — BP 154/82 | HR 61 | Temp 98.9°F | Resp 17 | Ht 65.0 in | Wt 144.2 lb

## 2017-03-16 DIAGNOSIS — I10 Essential (primary) hypertension: Secondary | ICD-10-CM | POA: Diagnosis not present

## 2017-03-16 DIAGNOSIS — M159 Polyosteoarthritis, unspecified: Secondary | ICD-10-CM

## 2017-03-16 DIAGNOSIS — R69 Illness, unspecified: Secondary | ICD-10-CM | POA: Diagnosis not present

## 2017-03-16 DIAGNOSIS — E119 Type 2 diabetes mellitus without complications: Secondary | ICD-10-CM | POA: Diagnosis not present

## 2017-03-16 DIAGNOSIS — I480 Paroxysmal atrial fibrillation: Secondary | ICD-10-CM | POA: Diagnosis not present

## 2017-03-16 DIAGNOSIS — M150 Primary generalized (osteo)arthritis: Secondary | ICD-10-CM | POA: Diagnosis not present

## 2017-03-16 DIAGNOSIS — C189 Malignant neoplasm of colon, unspecified: Secondary | ICD-10-CM

## 2017-03-16 DIAGNOSIS — M8949 Other hypertrophic osteoarthropathy, multiple sites: Secondary | ICD-10-CM

## 2017-03-16 DIAGNOSIS — F419 Anxiety disorder, unspecified: Secondary | ICD-10-CM

## 2017-03-16 MED ORDER — HYDROCODONE-ACETAMINOPHEN 10-325 MG PO TABS: ORAL_TABLET | ORAL | 0 refills | Status: DC

## 2017-03-16 MED ORDER — LORAZEPAM 1 MG PO TABS: ORAL_TABLET | ORAL | 0 refills | Status: DC

## 2017-03-16 NOTE — Progress Notes (Signed)
Careteam: Patient Care Team: Lauree Chandler, NP as PCP - General (Geriatric Medicine) Yehuda Mao, MD as Attending Physician (Rheumatology) Frederik Pear, MD as Consulting Physician (Orthopedic Surgery) Crista Luria, MD as Consulting Physician (Dermatology) Gery Pray, MD as Consulting Physician (Radiation Oncology) Mosie Epstein, CRNA (Inactive) as Referring Physician (Anesthesiology) Clent Jacks, MD as Consulting Physician (Ophthalmology)  Advanced Directive information Does Patient Have a Medical Advance Directive?: No  Allergies  Allergen Reactions  . Daypro [Oxaprozin]   . Erythromycin   . Talwin [Pentazocine]     Chief Complaint  Patient presents with  . Medical Management of Chronic Issues    Pt is being seen for a 3 month routine visit. Pt has no concerns today.   . Other    Sister, Hassan Rowan, in room     HPI: Patient is a 83 y.o. female seen in the office today for routine follow up pt with hx of a fib, anxiety, chronic pain, H pylori, and colon cancer. In March 2018 she was taken to OR- per oncology notes- A mass was adherent to the sigmoid colon and posterior sacral peritoneum. The mass ruptured during manipulation. 2-3 subcentimeter nodules were noted and small bowel mesentery. A frozen section revealed a poorly differentiated carcinoma initially thought to represent a high-grade serous carcinoma. The sigmoid colon was resected. 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 The sigmoid colon resection revealed a mucinous adenocarcinoma with tumor involving lymph nodes. lymphovascular and perineural invasion was noted.  Following with oncology. No treatment will be curative and now undergoing observation. Last visit with oncologist plan was for CT scan for staging on 03/28/17. Pt says she does not want chemo. States appetite is good- she has gained a little weight. No abdominal pain or discomfort.   OA- to bilateral knees-  hydrocodone-apap help ease the pain, without side effects, only gets from [facility] and she takes all that is prescribed. Does need 3-4 times a day. Plans to see ortho due to knee pain worsening.   Anxiety- uses ativan- needing it about 3 times day.   Afib- on eliquis, rate controlled. No palpitations.   Review of Systems:  Review of Systems  Constitutional: Negative for chills, fever and weight loss.  HENT: Negative for tinnitus.   Respiratory: Negative for cough, sputum production and shortness of breath.   Cardiovascular: Negative for chest pain, palpitations and leg swelling.  Gastrointestinal: Negative for abdominal pain, constipation, diarrhea and heartburn.  Genitourinary: Positive for frequency. Negative for dysuria and urgency.  Musculoskeletal: Positive for back pain, joint pain and myalgias. Negative for falls.  Skin: Negative.   Neurological: Negative for dizziness and headaches.  Psychiatric/Behavioral: Negative for depression and memory loss. The patient is nervous/anxious. The patient does not have insomnia.     Past Medical History:  Diagnosis Date  . Anxiety state, unspecified   . Carpal tunnel syndrome   . Degeneration of intervertebral disc, site unspecified   . Depressive disorder, not elsewhere classified   . Dysphagia, unspecified(787.20)   . Edema   . Headache(784.0)   . Insomnia, unspecified   . Internal hemorrhoids   . Liver cyst   . Lumbago   . Malignant neoplasm of breast (female), unspecified site   . Nonspecific elevation of levels of transaminase or lactic acid dehydrogenase (LDH)   . Osteoarthrosis, unspecified whether generalized or localized, unspecified site   . Otalgia, unspecified   . Other and unspecified hyperlipidemia   . Other chronic nonalcoholic liver  disease   . Other malaise and fatigue   . Other malaise and fatigue   . Other nonspecific abnormal serum enzyme levels   . Pain in joint, lower leg   . Pain in joint, pelvic region and thigh    . Pain in joint, shoulder region   . Palpitations   . Reflux esophagitis   . Type II or unspecified type diabetes mellitus without mention of complication, not stated as uncontrolled   . Unspecified essential hypertension   . Unspecified vitamin D deficiency    Past Surgical History:  Procedure Laterality Date  . ABDOMINAL HYSTERECTOMY  1981   with appendix removal  . APPENDECTOMY     with hysterectomy  . BREAST CYST EXCISION Left 1982  . BREAST LUMPECTOMY Left 1994   lymph cancer   . CATARACT EXTRACTION, BILATERAL  2016   [place]  . CHOLECYSTECTOMY  1987  . DILATION AND CURETTAGE OF UTERUS  1980  . LUMBAR DISC SURGERY  2003   L4-L5  . LUMBAR SPINE SURGERY  2002  . TOTAL HIP ARTHROPLASTY Bilateral 2003   Social History:   reports that she has never smoked. She has never used smokeless tobacco. She reports that she does not drink alcohol or use drugs.  Family History  Problem Relation Age of Onset  . Heart disease Mother   . Arthritis Mother   . Prostate cancer Father   . Diabetes Brother   . Prostate cancer Brother     Medications: Patient's Medications  New Prescriptions   No medications on file  Previous Medications   AMLODIPINE (NORVASC) 5 MG TABLET    One daily to help control BP   BUTALBITAL-ACETAMINOPHEN-CAFFEINE (FIORICET) 50-325-40 MG TABLET    Take one or 2 tablets every 6 hours if needed for headache   CHOLECALCIFEROL (VITAMIN D) 1000 UNITS TABLET    Take two tablets once daily   ELIQUIS 5 MG TABS TABLET    One twice daily for anticoagulation   ENALAPRIL (VASOTEC) 20 MG TABLET    TAKE 1 TABLET BY MOUTH TWICE DAILY FOR BLOOD PRESSURE   HYDROCODONE-ACETAMINOPHEN (NORCO) 10-325 MG TABLET    Take one tablet by mouth up to four times a day as needed for pain.   LOPERAMIDE (IMODIUM A-D) 2 MG TABLET    Take one tablet by mouth with each loose stool up to 8 tablets in 24 hours   LORAZEPAM (ATIVAN) 1 MG TABLET    Take one tablet by mouth every 4 hours as needed for  anxiety   METOPROLOL SUCCINATE (TOPROL-XL) 50 MG 24 HR TABLET    TAKE 1 TABLET BY MOUTH TWICE DAILY FOR BLOOD PRESSURE   OMEGA-3 FATTY ACIDS (FISH OIL PO)    Take 1,040 Units by mouth. Take one tablet once daily  Modified Medications   No medications on file  Discontinued Medications   CYCLOBENZAPRINE (FLEXERIL) 10 MG TABLET    One up to 3 times daily as needed to help muscle spasm and pain     Physical Exam:  Vitals:   03/16/17 1520  BP: (!) 154/82  Pulse: 61  Resp: 17  Temp: 98.9 F (37.2 C)  TempSrc: Oral  SpO2: 96%  Weight: 144 lb 3.2 oz (65.4 kg)  Height: 5\' 5"  (1.651 m)   Body mass index is 24 kg/m.  Physical Exam  Constitutional: She is oriented to person, place, and time. She appears well-developed and well-nourished. No distress.  HENT:  Head: Normocephalic and atraumatic.  Bilateral  partial deafness.  Eyes: Pupils are equal, round, and reactive to light. Conjunctivae and EOM are normal. Left eye exhibits no discharge. No scleral icterus.  Neck: Normal range of motion. Neck supple. No JVD present. No tracheal deviation present. No thyromegaly present.  Cardiovascular: Normal rate, regular rhythm, normal heart sounds and intact distal pulses.  Exam reveals no gallop and no friction rub.   No murmur heard. Pulmonary/Chest: Effort normal and breath sounds normal. No respiratory distress. She has no wheezes. She has no rales.  Abdominal: Soft. Bowel sounds are normal. She exhibits no distension. There is no tenderness.  Healed midline incision from xiphoid to pubis.  Genitourinary:  Genitourinary Comments: HX small internal hemorrhoid  Musculoskeletal: She exhibits tenderness. She exhibits no edema (1+ right lower leg).  Reduced ability to raise her arms. Bilateral shoulder pain. Bilateral knee pains.   Lymphadenopathy:    She has no cervical adenopathy.  Neurological: She is alert and oriented to person, place, and time. No cranial nerve deficit. Coordination  normal.  01/20/16 MMSE 30/30  Skin: Skin is warm and dry. She is not diaphoretic.  Psychiatric: She has a normal mood and affect.    Labs reviewed: Basic Metabolic Panel:  Recent Labs  05/25/16 1615 11/30/16 1332 03/13/17 1000  NA 140 142 141  K 4.0 4.3 4.1  CL 105  --  108  CO2 23 29 27   GLUCOSE 110* 97 95  BUN 17 14.1 14  CREATININE 0.97* 0.8 0.81  CALCIUM 9.8 10.1 9.9  TSH 2.51  --   --    Liver Function Tests:  Recent Labs  05/25/16 1615 11/30/16 1332 03/13/17 1000  AST 22 36* 27  ALT 10 24 25   ALKPHOS 48 49  --   BILITOT 0.8 0.72 0.8  PROT 6.5 6.8 6.5  ALBUMIN 3.5* 3.6  --    No results for input(s): LIPASE, AMYLASE in the last 8760 hours. No results for input(s): AMMONIA in the last 8760 hours. CBC:  Recent Labs  05/25/16 1615 11/30/16 1332  WBC 5.9 5.7  NEUTROABS 3,422 3.0  HGB 13.4 11.3*  HCT 39.8 34.5*  MCV 92.3 92.6  PLT 302 173   Lipid Panel:  Recent Labs  09/13/16 1402 03/13/17 1000  CHOL 187 158  HDL 47* 62  LDLCALC 119*  --   TRIG 105 87  CHOLHDL 4.0 2.5   TSH:  Recent Labs  05/25/16 1615  TSH 2.51   A1C: Lab Results  Component Value Date   HGBA1C 5.6 03/13/2017     Assessment/Plan 1.  Primary osteoarthritis involving multiple joints Worsening bilateral knee pain. Plans to see ortho sooner for possible knee injections. To cont on hydrocodone/apap as needed  - HYDROcodone-acetaminophen (NORCO) 10-325 MG tablet; Take one tablet by mouth up to four times a day as needed for pain.  Dispense: 120 tablet; Refill: 0  2. Essential hypertension Stable, will cont current regimen.   3. PAF (paroxysmal atrial fibrillation) (HCC) Rate controlled, current in SR, conts on eliquis 5 mg BID  4. Malignant neoplasm of colon, unspecified part of colon [facility]) Following with oncology, plan for restaging CT scan.   5. Type 2 diabetes mellitus without complication, without long-term current use of insulin (HCC) Controlled, not current  on medication at this time.   6. Anxiety -discussed adding SSRI and to reduce use of ativan. She does not wish to start another medication at this time. Pt states she is taking TID at this time and feels  like she coulddecrease to BID and if she is feeling increase in anxiety will call for further treatment.  - LORazepam (ATIVAN) 1 MG tablet; Take one tablet by mouth twice daily as needed for anxiety  Dispense: 60 tablet; Refill: 0  Next appt: 4 months, sooner if needed  Keymari Sato K. Harle Battiest  [facility] [phone] 8 am - 5 pm) [phone] (after hours)

## 2017-03-16 NOTE — Patient Instructions (Signed)
Decrease ativan to twice daily as needed for anxiety- try to minimize use of this. If needed we can put you on another anxiety medication  May use melatonin at night to help with sleep

## 2017-03-27 ENCOUNTER — Other Ambulatory Visit: Payer: Self-pay | Admitting: Nurse Practitioner

## 2017-03-27 DIAGNOSIS — C189 Malignant neoplasm of colon, unspecified: Secondary | ICD-10-CM

## 2017-03-28 ENCOUNTER — Other Ambulatory Visit: Payer: Self-pay | Admitting: *Deleted

## 2017-03-28 ENCOUNTER — Telehealth: Payer: Self-pay | Admitting: Oncology

## 2017-03-28 ENCOUNTER — Ambulatory Visit (HOSPITAL_COMMUNITY)
Admission: RE | Admit: 2017-03-28 | Discharge: 2017-03-28 | Disposition: A | Discharge status: Home / Self Care | Payer: Medicare HMO | Source: Ambulatory Visit | Attending: Nurse Practitioner | Admitting: Nurse Practitioner

## 2017-03-28 ENCOUNTER — Other Ambulatory Visit (HOSPITAL_BASED_OUTPATIENT_CLINIC_OR_DEPARTMENT_OTHER): Payer: Medicare HMO

## 2017-03-28 DIAGNOSIS — C189 Malignant neoplasm of colon, unspecified: Secondary | ICD-10-CM

## 2017-03-28 DIAGNOSIS — I700 Atherosclerosis of aorta: Secondary | ICD-10-CM | POA: Insufficient documentation

## 2017-03-28 DIAGNOSIS — C187 Malignant neoplasm of sigmoid colon: Secondary | ICD-10-CM | POA: Diagnosis not present

## 2017-03-28 DIAGNOSIS — C7961 Secondary malignant neoplasm of right ovary: Secondary | ICD-10-CM

## 2017-03-28 DIAGNOSIS — C785 Secondary malignant neoplasm of large intestine and rectum: Secondary | ICD-10-CM | POA: Insufficient documentation

## 2017-03-28 DIAGNOSIS — R918 Other nonspecific abnormal finding of lung field: Secondary | ICD-10-CM | POA: Diagnosis not present

## 2017-03-28 LAB — CEA (IN HOUSE-CHCC): CEA (CHCC-IN HOUSE): 67.97 ng/mL — AB (ref 0.00–5.00)

## 2017-03-28 LAB — CBC WITH DIFFERENTIAL/PLATELET
BASO%: 0.2 % (ref 0.0–2.0)
BASOS ABS: 0 10*3/uL (ref 0.0–0.1)
EOS%: 1.4 % (ref 0.0–7.0)
Eosinophils Absolute: 0.1 10*3/uL (ref 0.0–0.5)
HCT: 37.4 % (ref 34.8–46.6)
HGB: 12.3 g/dL (ref 11.6–15.9)
LYMPH%: 40.9 % (ref 14.0–49.7)
MCH: 30.7 pg (ref 25.1–34.0)
MCHC: 32.9 g/dL (ref 31.5–36.0)
MCV: 93.4 fL (ref 79.5–101.0)
MONO#: 0.4 10*3/uL (ref 0.1–0.9)
MONO%: 6.1 % (ref 0.0–14.0)
NEUT#: 3.3 10*3/uL (ref 1.5–6.5)
NEUT%: 51.4 % (ref 38.4–76.8)
PLATELETS: 184 10*3/uL (ref 145–400)
RBC: 4 10*6/uL (ref 3.70–5.45)
RDW: 14.8 % — ABNORMAL HIGH (ref 11.2–14.5)
WBC: 6.3 10*3/uL (ref 3.9–10.3)
lymph#: 2.6 10*3/uL (ref 0.9–3.3)

## 2017-03-28 LAB — COMPREHENSIVE METABOLIC PANEL
ALK PHOS: 60 U/L (ref 40–150)
ALT: 20 U/L (ref 0–55)
AST: 29 U/L (ref 5–34)
Albumin: 3.8 g/dL (ref 3.5–5.0)
Anion Gap: 6 mEq/L (ref 3–11)
BUN: 13.6 mg/dL (ref 7.0–26.0)
CHLORIDE: 105 meq/L (ref 98–109)
CO2: 28 mEq/L (ref 22–29)
Calcium: 10.2 mg/dL (ref 8.4–10.4)
Creatinine: 0.8 mg/dL (ref 0.6–1.1)
EGFR: 66 mL/min/{1.73_m2} — AB (ref >90)
GLUCOSE: 102 mg/dL (ref 70–140)
POTASSIUM: 4.6 meq/L (ref 3.5–5.1)
SODIUM: 139 meq/L (ref 136–145)
Total Bilirubin: 0.97 mg/dL (ref 0.20–1.20)
Total Protein: 7 g/dL (ref 6.4–8.3)

## 2017-03-28 MED ORDER — IOPAMIDOL (ISOVUE-300) INJECTION 61%
INTRAVENOUS | Status: AC
  Filled 2017-03-28: qty 100

## 2017-03-28 MED ORDER — IOPAMIDOL (ISOVUE-300) INJECTION 61%
100.0000 mL | Freq: Once | INTRAVENOUS | Status: AC | PRN
  Administered 2017-03-28: 100 mL via INTRAVENOUS

## 2017-03-28 NOTE — Telephone Encounter (Signed)
Spoke with patient regarding the change in her appt per 9/24 sch msg.

## 2017-03-30 ENCOUNTER — Ambulatory Visit (HOSPITAL_BASED_OUTPATIENT_CLINIC_OR_DEPARTMENT_OTHER): Payer: Medicare HMO | Admitting: Oncology

## 2017-03-30 ENCOUNTER — Telehealth: Payer: Self-pay | Admitting: Oncology

## 2017-03-30 VITALS — BP 152/56 | HR 63 | Temp 98.8°F | Resp 17 | Ht 65.0 in | Wt 143.4 lb

## 2017-03-30 DIAGNOSIS — I10 Essential (primary) hypertension: Secondary | ICD-10-CM | POA: Diagnosis not present

## 2017-03-30 DIAGNOSIS — C187 Malignant neoplasm of sigmoid colon: Secondary | ICD-10-CM

## 2017-03-30 DIAGNOSIS — C189 Malignant neoplasm of colon, unspecified: Secondary | ICD-10-CM

## 2017-03-30 NOTE — Progress Notes (Signed)
[facility] OFFICE PROGRESS NOTE   Diagnosis: Colon cancer  INTERVAL HISTORY:   Tamara Yang returns as scheduled. She feels well. Good appetite. No difficulty with bowel function. She reports discomfort at the left upper arm with movement at the left shoulder. The pain has been present since prior to the CT in May.  Objective:  Vital signs in last 24 hours:  Blood pressure (!) 152/56, pulse 63, temperature 98.8 F (37.1 C), temperature source Oral, resp. rate 17, height '5\' 5"'$  (1.651 m), weight 143 lb 6.4 oz (65 kg), SpO2 97 %.    HEENT: Neck without mass Lymphatics: No cervical, supraclavicular, axillary, or inguinal nodes Resp: Lungs clear bilaterally Cardio: Regular rate and rhythm, 2/6 systolic murmur GI: No hepatosplenomegaly, no mass, nontender Vascular: No leg edema Breasts: Status post left lumpectomy. Firm nodularity superior to the medial aspect of the lumpectomy scar, no discrete mass  Musculoskeletal: Discomfort with motion at the left shoulder. No pain to percussion at the left arm    Lab Results:  Lab Results  Component Value Date   WBC 6.3 03/28/2017   HGB 12.3 03/28/2017   HCT 37.4 03/28/2017   MCV 93.4 03/28/2017   PLT 184 03/28/2017   NEUTROABS 3.3 03/28/2017    CMP     Component Value Date/Time   NA 139 03/28/2017 1312   K 4.6 03/28/2017 1312   CL 108 03/13/2017 1000   CO2 28 03/28/2017 1312   GLUCOSE 102 03/28/2017 1312   BUN 13.6 03/28/2017 1312   CREATININE 0.8 03/28/2017 1312   CALCIUM 10.2 03/28/2017 1312   PROT 7.0 03/28/2017 1312   ALBUMIN 3.8 03/28/2017 1312   AST 29 03/28/2017 1312   ALT 20 03/28/2017 1312   ALKPHOS 60 03/28/2017 1312   BILITOT 0.97 03/28/2017 1312   GFRNONAA 68 03/13/2017 1000   GFRAA 79 03/13/2017 1000    Lab Results  Component Value Date   CEA1 67.97 (H) 03/28/2017    No results found for: INR  Imaging:  Ct Chest W Contrast  Result Date: 03/28/2017 CLINICAL DATA:  Colon cancer, stage  IV. EXAM: CT CHEST, ABDOMEN, AND PELVIS WITH CONTRAST TECHNIQUE: Multidetector CT imaging of the chest, abdomen and pelvis was performed following the standard protocol during bolus administration of intravenous contrast. CONTRAST:  152m ISOVUE-300 IOPAMIDOL (ISOVUE-300) INJECTION 61% COMPARISON:  11/30/2016 FINDINGS: CT CHEST FINDINGS Cardiovascular: The heart size is normal. Aortic atherosclerosis noted. Calcification in the LAD and left circumflex coronary arteries noted. Mediastinum/Nodes: No enlarged mediastinal, hilar, or axillary lymph nodes. Left axillary nodal dissection noted. Thyroid gland, trachea, and esophagus demonstrate no significant findings. Lungs/Pleura: Mild right pleural thickening versus trace pleural effusion noted. Mild pleural irregularity overlying the posterior aspect of the right lower lobe is identified, new from previous exam. The largest is best seen on the sagittal image 81 of series 5 and measures 1.2 x 0.6 cm. Small perifissural nodule within the right lung along the minor fissure measures 3 mm. New from previous exam. Along the major fissure there is a small nodule measuring 3 mm, image 97 of series 6. Not seen on previous study. Musculoskeletal: No chest wall mass or suspicious bone lesions identified. CT ABDOMEN PELVIS FINDINGS Hepatobiliary: Liver cysts are again identified. Similar to previous exam. No suspicious liver abnormalities identified. Previous cholecystectomy. Chronic increase caliber of the common bile duct measures up to 1.2 cm. Pancreas: Normal appearance of the pancreas. Spleen: The spleen is normal. Adrenals/Urinary Tract: Normal appearance of the adrenal  glands. No kidney mass or hydronephrosis identified the urinary bladder is largely obscured by beam hardening artifact from patient's bilateral hip arthroplasty. Stomach/Bowel: The stomach is normal. The small bowel loops have a normal course and caliber. No obstruction. Unremarkable appearance of the colon.  Anastomosis in the rectosigmoid junction is again noted. Posterior to the anastomosis is a gas collection which measures 2.7 x 2.7 x 2.7 cm. Previously this measured 3.3 x 2.8 x 6.4 cm. Decreased from previous exam.Presacral soft tissue thickening is again noted compatible with post treatment changes. Vascular/Lymphatic: Aortic atherosclerosis. No aneurysm. No enlarged lymph nodes within the upper abdomen. No pelvic or inguinal adenopathy. Reproductive: Previous hysterectomy.  No adnexal mass noted. Other: No abdominal wall hernia or abnormality. No abdominopelvic ascites. Musculoskeletal: Bilateral hip arthroplasty devices. No aggressive bone lesions identified. Degenerative disc disease noted within the lumbar spine. IMPRESSION: 1. There is mild pleural irregularity overlying the posterior right lower lobe. Etiology indeterminate and may be related to trace pleural fluid. Pleural nodularity secondary to metastatic disease is considered less favored and attention on follow-up imaging is advised. Alternatively, a PET-CT may be helpful to assess for areas of hypermetabolism. 2. Decrease in volume of previous postsurgical fluid collection in the presacral soft tissue space which now only contains gas. 3. No evidence for nodal metastasis or metastasis to the liver. 4.  Aortic Atherosclerosis (ICD10-I70.0). Electronically Signed   By: Kerby Moors M.D.   On: 03/28/2017 20:01   Ct Abdomen Pelvis W Contrast  Result Date: 03/28/2017 CLINICAL DATA:  Colon cancer, stage IV. EXAM: CT CHEST, ABDOMEN, AND PELVIS WITH CONTRAST TECHNIQUE: Multidetector CT imaging of the chest, abdomen and pelvis was performed following the standard protocol during bolus administration of intravenous contrast. CONTRAST:  125m ISOVUE-300 IOPAMIDOL (ISOVUE-300) INJECTION 61% COMPARISON:  11/30/2016 FINDINGS: CT CHEST FINDINGS Cardiovascular: The heart size is normal. Aortic atherosclerosis noted. Calcification in the LAD and left circumflex  coronary arteries noted. Mediastinum/Nodes: No enlarged mediastinal, hilar, or axillary lymph nodes. Left axillary nodal dissection noted. Thyroid gland, trachea, and esophagus demonstrate no significant findings. Lungs/Pleura: Mild right pleural thickening versus trace pleural effusion noted. Mild pleural irregularity overlying the posterior aspect of the right lower lobe is identified, new from previous exam. The largest is best seen on the sagittal image 81 of series 5 and measures 1.2 x 0.6 cm. Small perifissural nodule within the right lung along the minor fissure measures 3 mm. New from previous exam. Along the major fissure there is a small nodule measuring 3 mm, image 97 of series 6. Not seen on previous study. Musculoskeletal: No chest wall mass or suspicious bone lesions identified. CT ABDOMEN PELVIS FINDINGS Hepatobiliary: Liver cysts are again identified. Similar to previous exam. No suspicious liver abnormalities identified. Previous cholecystectomy. Chronic increase caliber of the common bile duct measures up to 1.2 cm. Pancreas: Normal appearance of the pancreas. Spleen: The spleen is normal. Adrenals/Urinary Tract: Normal appearance of the adrenal glands. No kidney mass or hydronephrosis identified the urinary bladder is largely obscured by beam hardening artifact from patient's bilateral hip arthroplasty. Stomach/Bowel: The stomach is normal. The small bowel loops have a normal course and caliber. No obstruction. Unremarkable appearance of the colon. Anastomosis in the rectosigmoid junction is again noted. Posterior to the anastomosis is a gas collection which measures 2.7 x 2.7 x 2.7 cm. Previously this measured 3.3 x 2.8 x 6.4 cm. Decreased from previous exam.Presacral soft tissue thickening is again noted compatible with post treatment changes. Vascular/Lymphatic: Aortic atherosclerosis. No  aneurysm. No enlarged lymph nodes within the upper abdomen. No pelvic or inguinal adenopathy. Reproductive:  Previous hysterectomy.  No adnexal mass noted. Other: No abdominal wall hernia or abnormality. No abdominopelvic ascites. Musculoskeletal: Bilateral hip arthroplasty devices. No aggressive bone lesions identified. Degenerative disc disease noted within the lumbar spine. IMPRESSION: 1. There is mild pleural irregularity overlying the posterior right lower lobe. Etiology indeterminate and may be related to trace pleural fluid. Pleural nodularity secondary to metastatic disease is considered less favored and attention on follow-up imaging is advised. Alternatively, a PET-CT may be helpful to assess for areas of hypermetabolism. 2. Decrease in volume of previous postsurgical fluid collection in the presacral soft tissue space which now only contains gas. 3. No evidence for nodal metastasis or metastasis to the liver. 4.  Aortic Atherosclerosis (ICD10-I70.0). Electronically Signed   By: Kerby Moors M.D.   On: 03/28/2017 20:01    Medications: I have reviewed the patient's current medications.  Assessment/Plan: 1. Metastatic colon cancer ? Sigmoid colectomy, right oophorectomy, omentectomy, and mesenteric implant biopsies at [facility] 09/30/2016 ? Sigmoid colon cancer (T4a,N1b,M1c), MSI-stable, mismatch repair protein expression intact ? Metastatic colon cancer involving the omentum, right ovary, mesenteric nodule biopsies\ ? CT chest/abdomen/pelvis 11/30/2016-no evidence of metastatic disease in the chest, abdomen, pelvis. 5 mm lymph node right of the IVC. Postsurgical fluid collection in the presacral space immediately adjacent to the rectal anastomosis with an air-fluid level. ? Foundation 1-no RASmutation; ERBB2 amplification; microsatellite stable; low tumor mutationalburden ? CTs 03/28/2017-new right pleural irregularity/nodularity, no other evidence of metastatic disease 2. Left breast cancer 1994  3. Hypertension  4. Arthritis    Disposition:  Ms. Mooneyhan appears asymptomatic from colon  cancer. The CEA is higher. I reviewed the CT images with Ms. Bolinger and her daughter. It is unclear whether the small nodular areas in the right chest represent progressive colon cancer or a benign etiology.  The left arm discomfort is most likely secondary to a benign musculoskeletal condition. She does not wish to have an x-ray of the left arm/shoulder at present.  Ms. Medaglia would like to continue observation. She does not wish to consider systemic therapy at present.  She will return for an office visit and CEA in 3 months. She will contact us for new symptoms in the interim.  Donneta Romberg, MD  03/30/2017  12:45 PM

## 2017-03-30 NOTE — Telephone Encounter (Signed)
Gave patient avs report and appointments for January 2019.  °

## 2017-04-14 ENCOUNTER — Other Ambulatory Visit: Payer: Self-pay | Admitting: *Deleted

## 2017-04-14 DIAGNOSIS — M8949 Other hypertrophic osteoarthropathy, multiple sites: Secondary | ICD-10-CM

## 2017-04-14 DIAGNOSIS — M150 Primary generalized (osteo)arthritis: Principal | ICD-10-CM

## 2017-04-14 DIAGNOSIS — M159 Polyosteoarthritis, unspecified: Secondary | ICD-10-CM

## 2017-04-14 NOTE — Telephone Encounter (Signed)
Patient requested and will pick up [place] not verified due to power outage Handwritten Rx Given. Granville Lewis signed.

## 2017-05-03 ENCOUNTER — Other Ambulatory Visit: Payer: Self-pay | Admitting: Internal Medicine

## 2017-05-03 DIAGNOSIS — R51 Headache: Principal | ICD-10-CM

## 2017-05-03 DIAGNOSIS — R519 Headache, unspecified: Secondary | ICD-10-CM

## 2017-05-03 NOTE — Telephone Encounter (Signed)
Ok to refill? Last filled 12/14/16 #100 1RF

## 2017-05-04 NOTE — Telephone Encounter (Signed)
Yes this is okay 

## 2017-05-08 ENCOUNTER — Other Ambulatory Visit: Payer: Self-pay

## 2017-05-08 DIAGNOSIS — R519 Headache, unspecified: Secondary | ICD-10-CM

## 2017-05-08 DIAGNOSIS — R51 Headache: Principal | ICD-10-CM

## 2017-05-08 MED ORDER — BUTALBITAL-APAP-CAFFEINE 50-325-40 MG PO TABS: ORAL_TABLET | ORAL | 1 refills | Status: AC

## 2017-05-08 NOTE — Telephone Encounter (Signed)
Fax  from [employer], printed

## 2017-05-11 DIAGNOSIS — Z96642 Presence of left artificial hip joint: Secondary | ICD-10-CM | POA: Diagnosis not present

## 2017-05-11 DIAGNOSIS — Z96641 Presence of right artificial hip joint: Secondary | ICD-10-CM | POA: Diagnosis not present

## 2017-05-11 DIAGNOSIS — M1711 Unilateral primary osteoarthritis, right knee: Secondary | ICD-10-CM | POA: Diagnosis not present

## 2017-05-30 ENCOUNTER — Other Ambulatory Visit: Payer: Self-pay | Admitting: *Deleted

## 2017-05-30 DIAGNOSIS — M150 Primary generalized (osteo)arthritis: Principal | ICD-10-CM

## 2017-05-30 DIAGNOSIS — M8949 Other hypertrophic osteoarthropathy, multiple sites: Secondary | ICD-10-CM

## 2017-05-30 DIAGNOSIS — M159 Polyosteoarthritis, unspecified: Secondary | ICD-10-CM

## 2017-05-30 MED ORDER — HYDROCODONE-ACETAMINOPHEN 10-325 MG PO TABS: ORAL_TABLET | ORAL | 0 refills | Status: DC

## 2017-05-30 NOTE — Telephone Encounter (Signed)
Patient requested and will pick up [place] Database Verified.  

## 2017-07-03 ENCOUNTER — Telehealth: Payer: Self-pay

## 2017-07-03 NOTE — Telephone Encounter (Signed)
Unable to reach patient.

## 2017-07-05 ENCOUNTER — Telehealth: Payer: Self-pay | Admitting: *Deleted

## 2017-07-05 NOTE — Telephone Encounter (Signed)
Patient called to clarify her appointments on 07/06/2017. Appts. Clarified.

## 2017-07-06 ENCOUNTER — Ambulatory Visit (HOSPITAL_BASED_OUTPATIENT_CLINIC_OR_DEPARTMENT_OTHER): Payer: Medicare HMO | Admitting: Oncology

## 2017-07-06 ENCOUNTER — Telehealth: Payer: Self-pay | Admitting: Oncology

## 2017-07-06 ENCOUNTER — Other Ambulatory Visit (HOSPITAL_BASED_OUTPATIENT_CLINIC_OR_DEPARTMENT_OTHER): Payer: Medicare HMO

## 2017-07-06 VITALS — BP 156/60 | HR 53 | Temp 97.6°F | Resp 20 | Ht 65.0 in | Wt 139.5 lb

## 2017-07-06 DIAGNOSIS — Z7901 Long term (current) use of anticoagulants: Secondary | ICD-10-CM

## 2017-07-06 DIAGNOSIS — I10 Essential (primary) hypertension: Secondary | ICD-10-CM

## 2017-07-06 DIAGNOSIS — C189 Malignant neoplasm of colon, unspecified: Secondary | ICD-10-CM

## 2017-07-06 DIAGNOSIS — C187 Malignant neoplasm of sigmoid colon: Secondary | ICD-10-CM

## 2017-07-06 NOTE — Progress Notes (Signed)
[facility] OFFICE PROGRESS NOTE   Diagnosis: Colon cancer  INTERVAL HISTORY:   Tamara Yang returns as scheduled.  She feels well.  She has a good appetite.  She continues to have discomfort at the left upper arm and shoulder.  She has limited range of motion at the left shoulder.  She takes hydrocodone approximately twice daily for the shoulder pain and abdominal pain. She has a burning discomfort at the mid to lower back while cooking.  This is relieved with a heating pad.   Objective:  Vital signs in last 24 hours:  Blood pressure (!) 156/60, pulse (!) 53, temperature 97.6 F (36.4 C), temperature source Oral, resp. rate 20, height '5\' 5"'$  (1.651 m), weight 139 lb 8 oz (63.3 kg), SpO2 96 %.    HEENT: Neck without mass Lymphatics: No cervical, supraclavicular, axillary, or inguinal nodes Resp: Lungs clear bilaterally Cardio: Regular rate and rhythm GI: No hepatosplenomegaly, no mass, no apparent ascites, nontender Vascular: No leg edema Breast: There is a 1.5 cm oval firm density superior to the left areola (she reports this has been present chronically) Musculoskeletal: Pain with internal/external rotation the left shoulder.  Limited range of motion with raising the left arm.    Lab Results:  Lab Results  Component Value Date   WBC 6.3 03/28/2017   HGB 12.3 03/28/2017   HCT 37.4 03/28/2017   MCV 93.4 03/28/2017   PLT 184 03/28/2017   NEUTROABS 3.3 03/28/2017    CMP     Component Value Date/Time   NA 139 03/28/2017 1312   K 4.6 03/28/2017 1312   CL 108 03/13/2017 1000   CO2 28 03/28/2017 1312   GLUCOSE 102 03/28/2017 1312   BUN 13.6 03/28/2017 1312   CREATININE 0.8 03/28/2017 1312   CALCIUM 10.2 03/28/2017 1312   PROT 7.0 03/28/2017 1312   ALBUMIN 3.8 03/28/2017 1312   AST 29 03/28/2017 1312   ALT 20 03/28/2017 1312   ALKPHOS 60 03/28/2017 1312   BILITOT 0.97 03/28/2017 1312   GFRNONAA 68 03/13/2017 1000   GFRAA 79 03/13/2017 1000    Lab  Results  Component Value Date   CEA1 67.97 (H) 03/28/2017    Medications: I have reviewed the patient's current medications.   Assessment/Plan: 1. Metastatic colon cancer ? Sigmoid colectomy, right oophorectomy, omentectomy, and mesenteric implant biopsies [facility] 09/30/2016 ? Sigmoid colon cancer (T4a,N1b,M1c), MSI-stable, mismatch repair protein expression intact ? Metastatic colon cancer involving the omentum, right ovary, mesenteric nodule biopsies\ ? CT chest/abdomen/pelvis 11/30/2016-no evidence of metastatic disease in the chest, abdomen, pelvis. 5 mm lymph node right of the IVC. Postsurgical fluid collection in the presacral space immediately adjacent to the rectal anastomosis with an air-fluid level. ? Foundation 1-no RASmutation; ERBB2 amplification; microsatellite stable; low tumor mutationalburden ? CTs 03/28/2017-new right pleural irregularity/nodularity, no other evidence of metastatic disease 2. Left breast cancer 1994  3. Hypertension  4. Arthritis   Disposition: Tamara Yang appears unchanged.  The left shoulder discomfort is most likely related to a benign musculoskeletal condition.  She declines an x-ray of the left shoulder.  She says she will follow-up with Sherrie Mustache.  There is no clinical evidence for progression of metastatic colon cancer.  The plan is to continue observation.  We will follow-up on the CEA from today.  She will be scheduled for restaging CTs prior to an office visit in 3 months.  Tamara Yang is comfortable with continued observation.  She is maintained on Eliquis.  She believes she was started on Eliquis secondary to atrial fibrillation.  I recommended she discuss the indication for continuing Eliquis with her primary provider and cardiology.  The oval firm area in the left breast is most likely related to fat necrosis or scarring from the left breast surgery.  15 minutes were spent with the patient today.  The majority of the time  was used for counseling and coordination of care.  Betsy Coder, MD  07/06/2017  4:31 PM

## 2017-07-06 NOTE — Telephone Encounter (Signed)
Scheduled appt per 1/3 los - Gave patient AVS and calender per los. Central radiology to contact patient with ct scan

## 2017-07-06 NOTE — Telephone Encounter (Signed)
Scheduled appt per 1/3 los - Gave patient AVS and calender per los.  

## 2017-07-07 LAB — CEA (IN HOUSE-CHCC): CEA (CHCC-IN HOUSE): 408.85 ng/mL — AB (ref 0.00–5.00)

## 2017-07-10 ENCOUNTER — Telehealth: Payer: Self-pay | Admitting: Emergency Medicine

## 2017-07-10 NOTE — Telephone Encounter (Signed)
Patient returned this nurses call. Pt verbalized understanding of previous note regarding labs/CT  On 4/3 and to see Dr.Sherrill on 4/5. Pt instructions given for oral contrast.

## 2017-07-10 NOTE — Telephone Encounter (Addendum)
VM left with patient to call back regarding this note. Central scheduling made aware to call patient to schedule  ----- Message from Ladell Pier, MD sent at 07/07/2017  6:59 PM EST ----- Please call patient, cea is higher as expected, f/u as scheduled for CTs and office visit

## 2017-07-11 ENCOUNTER — Other Ambulatory Visit: Payer: Self-pay | Admitting: *Deleted

## 2017-07-11 DIAGNOSIS — M8949 Other hypertrophic osteoarthropathy, multiple sites: Secondary | ICD-10-CM

## 2017-07-11 DIAGNOSIS — M159 Polyosteoarthritis, unspecified: Secondary | ICD-10-CM

## 2017-07-11 DIAGNOSIS — M150 Primary generalized (osteo)arthritis: Principal | ICD-10-CM

## 2017-07-11 MED ORDER — HYDROCODONE-ACETAMINOPHEN 10-325 MG PO TABS
ORAL_TABLET | ORAL | 0 refills | Status: DC
Start: 2017-07-11 — End: 2017-08-18

## 2017-07-11 NOTE — Telephone Encounter (Signed)
Patient requested. [facility]CCS[facility] Database Verified Pharmacy Confirmed Pended Rx and sent to Jessica for approval.  

## 2017-07-14 ENCOUNTER — Telehealth: Payer: Self-pay | Admitting: Nurse Practitioner

## 2017-07-14 NOTE — Telephone Encounter (Signed)
Left msg asking pt to confirm AWV-S appt w/ Clarise Cruz. Last AWV 08/02/16. VDM (DD)

## 2017-07-19 ENCOUNTER — Encounter: Payer: Self-pay | Admitting: Nurse Practitioner

## 2017-07-19 ENCOUNTER — Ambulatory Visit (INDEPENDENT_AMBULATORY_CARE_PROVIDER_SITE_OTHER): Payer: Medicare HMO | Admitting: Nurse Practitioner

## 2017-07-19 VITALS — BP 138/86 | HR 97 | Temp 98.4°F | Ht 65.0 in | Wt 139.4 lb

## 2017-07-19 DIAGNOSIS — R519 Headache, unspecified: Secondary | ICD-10-CM

## 2017-07-19 DIAGNOSIS — F419 Anxiety disorder, unspecified: Secondary | ICD-10-CM

## 2017-07-19 DIAGNOSIS — R69 Illness, unspecified: Secondary | ICD-10-CM | POA: Diagnosis not present

## 2017-07-19 DIAGNOSIS — M8949 Other hypertrophic osteoarthropathy, multiple sites: Secondary | ICD-10-CM

## 2017-07-19 DIAGNOSIS — I480 Paroxysmal atrial fibrillation: Secondary | ICD-10-CM

## 2017-07-19 DIAGNOSIS — M150 Primary generalized (osteo)arthritis: Secondary | ICD-10-CM

## 2017-07-19 DIAGNOSIS — C189 Malignant neoplasm of colon, unspecified: Secondary | ICD-10-CM

## 2017-07-19 DIAGNOSIS — M159 Polyosteoarthritis, unspecified: Secondary | ICD-10-CM

## 2017-07-19 DIAGNOSIS — R51 Headache: Secondary | ICD-10-CM | POA: Diagnosis not present

## 2017-07-19 DIAGNOSIS — I10 Essential (primary) hypertension: Secondary | ICD-10-CM

## 2017-07-19 MED ORDER — DULOXETINE HCL 20 MG PO CPEP: 20.0000 mg | ORAL_CAPSULE | Freq: Every day | ORAL | 1 refills | Status: DC

## 2017-07-19 NOTE — Progress Notes (Signed)
Careteam: Patient Care Team: Lauree Chandler, NP as PCP - General (Geriatric Medicine) Yehuda Mao, MD as Attending Physician (Rheumatology) Frederik Pear, MD as Consulting Physician (Orthopedic Surgery) Crista Luria, MD as Consulting Physician (Dermatology) Gery Pray, MD as Consulting Physician (Radiation Oncology) Mosie Epstein, CRNA (Inactive) as Referring Physician (Anesthesiology) Clent Jacks, MD as Consulting Physician (Ophthalmology)  Advanced Directive information    Allergies  Allergen Reactions  . Daypro [Oxaprozin]   . Erythromycin   . Talwin [Pentazocine]     Chief Complaint  Patient presents with  . Medical Management of Chronic Issues    Pt is being seen for a 4 month routine visit.      HPI: Patient is a 84 y.o. female seen in the office today for a routine follow up on chronic conditions.  PMH: Metastatic colon cancer, HTN, Arthritis   Colon cancer: Pt. Saw Dr. Benay Spice on 07/06/17, the plan is to continue  with observation, and Pt. Is also scheduled for a CT scan of her abdomen on April 3rd.  Primary osteoarthritis involving multiple joints: to bilateral knees, states pain has been under control to both knees.  Left shoulder pain off and on and has been using heat and Norco for pain control; currently without pain today and good ROM.  Pt is currently pain free, however rates shoulder pain when present ranges from 4 to 10/10. Pain is aggravated by activity and heat makes it feel better.  Takes Norco 2 to 3 times per day, and it relieves her pain.     HTN: BP controlled on BP medications, she is taking BP medications as prescribed and voiced no concerns about BP medications.  Denies chest pain.  Nonintractable headache, unspecified chronicity pattern, unspecified headache type: pt with hx of migraine headaches, states her migraine headaches has been occurring more frequently since her brother died 05-14-2017. "It feels like my usual migraine  headaches".  Frontal Migraine headaches off and on, when present rates headache at 6/10 and is relieved with Fioricet, her last dose of Fioricet was on 07/16/17.  States she sees flashing lights few minutes before the onset of headaches, and the headaches usually last for about 3 to 8 hours depending on how soon she takes the Fioricet.  Denies photophobia, and states more recently headaches are decreasing in frequency.  Anxiety: Pt. Has been taking her Ativan twice a day every day since 05-14-2017 after the passing of her brother.  States she have been having episodes of anxiousness and the Ativan calms her nerves.      Review of Systems:  Review of Systems  Constitutional: Negative for chills, fever and weight loss.  HENT: Negative.   Eyes: Negative for blurred vision, double vision and photophobia.  Respiratory: Negative for cough and shortness of breath.   Cardiovascular: Negative for chest pain and palpitations.  Gastrointestinal: Positive for heartburn. Negative for abdominal pain, constipation, diarrhea, nausea and vomiting.  Musculoskeletal: Positive for back pain, joint pain and myalgias. Negative for falls.  Skin: Negative for rash.  Neurological: Positive for headaches. Negative for dizziness, speech change, loss of consciousness and weakness.  Endo/Heme/Allergies: Does not bruise/bleed easily.  Psychiatric/Behavioral: Negative for memory loss and suicidal ideas. The patient is nervous/anxious.     Past Medical History:  Diagnosis Date  . Anxiety state, unspecified   . Carpal tunnel syndrome   . Degeneration of intervertebral disc, site unspecified   . Depressive disorder, not elsewhere classified   .  Dysphagia, unspecified(787.20)   . Edema   . Headache(784.0)   . Insomnia, unspecified   . Internal hemorrhoids   . Liver cyst   . Lumbago   . Malignant neoplasm of breast (female), unspecified site   . Nonspecific elevation of levels of transaminase or lactic acid  dehydrogenase (LDH)   . Osteoarthrosis, unspecified whether generalized or localized, unspecified site   . Otalgia, unspecified   . Other and unspecified hyperlipidemia   . Other chronic nonalcoholic liver disease   . Other malaise and fatigue   . Other malaise and fatigue   . Other nonspecific abnormal serum enzyme levels   . Pain in joint, lower leg   . Pain in joint, pelvic region and thigh   . Pain in joint, shoulder region   . Palpitations   . Reflux esophagitis   . Type II or unspecified type diabetes mellitus without mention of complication, not stated as uncontrolled   . Unspecified essential hypertension   . Unspecified vitamin D deficiency    Past Surgical History:  Procedure Laterality Date  . ABDOMINAL HYSTERECTOMY  1981   with appendix removal  . APPENDECTOMY     with hysterectomy  . BREAST CYST EXCISION Left 1982  . BREAST LUMPECTOMY Left 1994   lymph cancer   . CATARACT EXTRACTION, BILATERAL  2016   [place]  . CHOLECYSTECTOMY  1987  . DILATION AND CURETTAGE OF UTERUS  1980  . LUMBAR DISC SURGERY  2003   L4-L5  . LUMBAR SPINE SURGERY  2002  . TOTAL HIP ARTHROPLASTY Bilateral 2003   Social History:   reports that  has never smoked. she has never used smokeless tobacco. She reports that she does not drink alcohol or use drugs.  Family History  Problem Relation Age of Onset  . Heart disease Mother   . Arthritis Mother   . Prostate cancer Father   . Diabetes Brother   . Prostate cancer Brother     Medications: Patient's Medications  New Prescriptions   DULOXETINE (CYMBALTA) 20 MG CAPSULE    Take 1 capsule (20 mg total) by mouth daily.  Previous Medications   AMLODIPINE (NORVASC) 5 MG TABLET    One daily to help control BP   BUTALBITAL-ACETAMINOPHEN-CAFFEINE (FIORICET) 50-325-40 MG TABLET    Take one or 2 tablets every 6 hours if needed for headache   CHOLECALCIFEROL (VITAMIN D) 1000 UNITS TABLET    Take two tablets once daily   ELIQUIS 5 MG TABS TABLET     One twice daily for anticoagulation   ENALAPRIL (VASOTEC) 20 MG TABLET    TAKE 1 TABLET BY MOUTH TWICE DAILY FOR BLOOD PRESSURE   HYDROCODONE-ACETAMINOPHEN (NORCO) 10-325 MG TABLET    Take one tablet by mouth up to four times a day as needed for pain.   LOPERAMIDE (IMODIUM A-D) 2 MG TABLET    Take one tablet by mouth with each loose stool up to 8 tablets in 24 hours   LORAZEPAM (ATIVAN) 1 MG TABLET    Take one tablet by mouth twice daily as needed for anxiety   METOPROLOL SUCCINATE (TOPROL-XL) 50 MG 24 HR TABLET    TAKE 1 TABLET BY MOUTH TWICE DAILY FOR BLOOD PRESSURE   OMEGA-3 FATTY ACIDS (FISH OIL PO)    Take 1,040 Units by mouth. Take one tablet once daily  Modified Medications   No medications on file  Discontinued Medications   No medications on file     Physical Exam:  Vitals:  07/19/17 1417  BP: 138/86  Pulse: 97  Temp: 98.4 F (36.9 C)  TempSrc: Oral  SpO2: 97%  Weight: 139 lb 6.4 oz (63.2 kg)  Height: '5\' 5"'$  (1.651 m)   Body mass index is 23.2 kg/m.  Physical Exam  Constitutional: She is oriented to person, place, and time. She appears well-developed and well-nourished. No distress.  HENT:  Head: Normocephalic and atraumatic.    Bilateral partial deafness.  Eyes: Conjunctivae and EOM are normal. Pupils are equal, round, and reactive to light. Left eye exhibits no discharge. No scleral icterus.  Neck: Normal range of motion. Neck supple. No JVD present. No tracheal deviation present. No thyromegaly present.  Cardiovascular: Normal rate, regular rhythm, normal heart sounds and normal pulses. Exam reveals no gallop and no friction rub.  No murmur heard. Pulmonary/Chest: Effort normal and breath sounds normal.  Abdominal: Soft. Bowel sounds are normal. She exhibits no distension. There is no tenderness.  Healed midline incision from xiphoid to pubis.  Musculoskeletal: She exhibits tenderness. Edema:  1+ edema to bilateral legs, Pt. states this is actually better.        Left shoulder: She exhibits decreased range of motion.       Left elbow: She exhibits decreased range of motion.       Right knee: She exhibits swelling.       Arms:      Legs: Reduced ability to raise her arms. Bilateral shoulder pain. Bilateral knee pains.   Lymphadenopathy:    She has no cervical adenopathy.  Neurological: She is alert and oriented to person, place, and time. No cranial nerve deficit. Coordination normal.  01/20/16 MMSE 30/30  Skin: Skin is warm and dry. Capillary refill takes less than 2 seconds. She is not diaphoretic.  Psychiatric: Her speech is normal and behavior is normal. Judgment and thought content normal. Cognition and memory are normal. She exhibits a depressed mood. She expresses no suicidal ideation.    Labs reviewed: Basic Metabolic Panel: Recent Labs    11/30/16 1332 03/13/17 1000 03/28/17 1312  NA 142 141 139  K 4.3 4.1 4.6  CL  --  108  --   CO2 '29 27 28  '$ GLUCOSE 97 95 102  BUN 14.1 14 13.6  CREATININE 0.8 0.81 0.8  CALCIUM 10.1 9.9 10.2   Liver Function Tests: Recent Labs    11/30/16 1332 03/13/17 1000 03/28/17 1312  AST 36* 27 29  ALT '24 25 20  '$ ALKPHOS 49  --  60  BILITOT 0.72 0.8 0.97  PROT 6.8 6.5 7.0  ALBUMIN 3.6  --  3.8   No results for input(s): LIPASE, AMYLASE in the last 8760 hours. No results for input(s): AMMONIA in the last 8760 hours. CBC: Recent Labs    11/30/16 1332 03/28/17 1312  WBC 5.7 6.3  NEUTROABS 3.0 3.3  HGB 11.3* 12.3  HCT 34.5* 37.4  MCV 92.6 93.4  PLT 173 184   Lipid Panel: Recent Labs    09/13/16 1402 03/13/17 1000  CHOL 187 158  HDL 47* 62  LDLCALC 119*  --   TRIG 105 87  CHOLHDL 4.0 2.5   TSH: No results for input(s): TSH in the last 8760 hours. A1C: Lab Results  Component Value Date   HGBA1C 5.6 03/13/2017   Depression screen [name] 2/9 07/19/2017 08/02/2016 01/20/2016  Decreased Interest 1 0 0  Down, Depressed, Hopeless 1 0 1  PHQ - 2 Score 2 0 1  Altered sleeping 0 - -  Tired, decreased energy 1 - -  Change in appetite 1 - -  Feeling bad or failure about yourself  0 - -  Trouble concentrating 0 - -  Moving slowly or fidgety/restless 2 - -  Suicidal thoughts 0 - -  PHQ-9 Score 6 - -  Difficult doing work/chores Not difficult at all - -     Assessment/Plan  1. PAF (paroxysmal atrial fibrillation) ([place]) Pt. Heart rate is controlled on current medication regimen, will continue to take eliquis for the PAF.  - CBC with Differential/Platelets  2. Essential hypertension Controlled on current medication regimen. - CMP with eGFR([facility])  3. Malignant neoplasm of colon, unspecified part of colon ([place]) Sees Dr. Benay Spice, last visit was 07/06/2017.   4. Anxiety Pt. PHQ9 score today was six, and she endorses having periods anxiousness.  Pt. Has agreed to start Cymbalta today and will return for follow up in four weeks. - DULoxetine (CYMBALTA) 20 MG capsule; Take 1 capsule (20 mg total) by mouth daily.  Dispense: 30 capsule; Refill: 1  5. Primary osteoarthritis involving multiple joints Pt. Encouraged to use heat for OA pain,  Declined Orthopedic referral at this time.  Declined physical therapy referral at this time.  Pt. Advised to only take the Norco as needed for her OA pains.  6. Nonintractable headache, unspecified chronicity pattern, unspecified headache type Pt. Will continue to take Fioricet for now for relief of headaches.  States the headaches frequencies has decreased since November of 2018. Pt. Declined the addition of a prophylactic medication to control her headaches. Pt. Educated on Fioricet and it possible side effects, she verbalized understanding of teaching. Encouraged to use tylenol 650 mg by mouth every 6 hours as needed for headaches vs Fioricet  Pt. Will follow up if headaches worsens or increases in frequencies; discussed referral to headache clinic.   Next appt: 1 month Shayda Kalka K. Harle Battiest  [facility] [phone] 8 am - 5 pm) [phone] (after hours)

## 2017-07-19 NOTE — Patient Instructions (Addendum)
Follow up in 1 month for mood

## 2017-07-20 LAB — CBC WITH DIFFERENTIAL/PLATELET
BASOS PCT: 0.4 %
Basophils Absolute: 27 cells/uL (ref 0–200)
EOS ABS: 60 {cells}/uL (ref 15–500)
Eosinophils Relative: 0.9 %
HCT: 34.2 % — ABNORMAL LOW (ref 35.0–45.0)
Hemoglobin: 11.3 g/dL — ABNORMAL LOW (ref 11.7–15.5)
Lymphs Abs: 2372 cells/uL (ref 850–3900)
MCH: 30.4 pg (ref 27.0–33.0)
MCHC: 33 g/dL (ref 32.0–36.0)
MCV: 91.9 fL (ref 80.0–100.0)
MONOS PCT: 6.1 %
MPV: 10.8 fL (ref 7.5–12.5)
NEUTROS ABS: 3832 {cells}/uL (ref 1500–7800)
Neutrophils Relative %: 57.2 %
Platelets: 248 10*3/uL (ref 140–400)
RBC: 3.72 10*6/uL — AB (ref 3.80–5.10)
RDW: 12.8 % (ref 11.0–15.0)
Total Lymphocyte: 35.4 %
WBC mixed population: 409 cells/uL (ref 200–950)
WBC: 6.7 10*3/uL (ref 3.8–10.8)

## 2017-07-20 LAB — COMPLETE METABOLIC PANEL WITH GFR
AG Ratio: 1.4 (calc) (ref 1.0–2.5)
ALKALINE PHOSPHATASE (APISO): 56 U/L (ref 33–130)
ALT: 8 U/L (ref 6–29)
AST: 17 U/L (ref 10–35)
Albumin: 3.8 g/dL (ref 3.6–5.1)
BUN/Creatinine Ratio: 13 (calc) (ref 6–22)
BUN: 18 mg/dL (ref 7–25)
CHLORIDE: 105 mmol/L (ref 98–110)
CO2: 27 mmol/L (ref 20–32)
Calcium: 9.8 mg/dL (ref 8.6–10.4)
Creat: 1.37 mg/dL — ABNORMAL HIGH (ref 0.60–0.88)
GFR, Est African American: 42 mL/min/{1.73_m2} — ABNORMAL LOW (ref >=60)
GFR, Est Non African American: 36 mL/min/{1.73_m2} — ABNORMAL LOW (ref >=60)
GLOBULIN: 2.7 g/dL (ref 1.9–3.7)
Glucose, Bld: 98 mg/dL (ref 65–139)
Potassium: 4.8 mmol/L (ref 3.5–5.3)
SODIUM: 139 mmol/L (ref 135–146)
Total Bilirubin: 0.6 mg/dL (ref 0.2–1.2)
Total Protein: 6.5 g/dL (ref 6.1–8.1)

## 2017-08-07 ENCOUNTER — Ambulatory Visit: Payer: Medicare HMO

## 2017-08-14 ENCOUNTER — Ambulatory Visit (INDEPENDENT_AMBULATORY_CARE_PROVIDER_SITE_OTHER): Payer: Medicare HMO

## 2017-08-14 VITALS — BP 138/60 | HR 65 | Temp 98.2°F | Ht 65.0 in | Wt 139.0 lb

## 2017-08-14 DIAGNOSIS — Z0000 Encounter for general adult medical examination without abnormal findings: Secondary | ICD-10-CM | POA: Diagnosis not present

## 2017-08-14 NOTE — Progress Notes (Signed)
Subjective:   Tamara Yang is a 84 y.o. female who presents for Medicare Annual (Subsequent) preventive examination.  Last AWv 08/02/2016    Objective:     Vitals: BP 138/60 (BP Location: Right Arm, Patient Position: Sitting)   Pulse 65   Temp 98.2 F (36.8 C) (Oral)   Ht 5\' 5"  (1.651 m)   Wt 139 lb (63 kg)   SpO2 98%   BMI 23.13 kg/m   Body mass index is 23.13 kg/m.  Advanced Directives 08/14/2017 03/30/2017 03/16/2017 12/14/2016 12/01/2016 11/15/2016 10/05/2016  Does Patient Have a Medical Advance Directive? No No No No No No No  Would patient like information on creating a medical advance directive? No - Patient declined;Yes (MAU/Ambulatory/Procedural Areas - Information given) No - Patient declined - - No - Patient declined No - Patient declined -    Tobacco Social History   Tobacco Use  Smoking Status Never Smoker  Smokeless Tobacco Never Used     Counseling given: Not Answered   Clinical Intake:  Pre-visit preparation completed: No  Pain : No/denies pain     Nutritional Status: BMI of 19-24  Normal Nutritional Risks: None Diabetes: No  How often do you need to have someone help you when you read instructions, pamphlets, or other written materials from your doctor or pharmacy?: 1 - Never What is the last grade level you completed in school?: 10th grade  Interpreter Needed?: No  Information entered by :: Tyson Dense, RN  Past Medical History:  Diagnosis Date  . Anxiety state, unspecified   . Carpal tunnel syndrome   . Degeneration of intervertebral disc, site unspecified   . Depressive disorder, not elsewhere classified   . Dysphagia, unspecified(787.20)   . Edema   . Headache(784.0)   . Insomnia, unspecified   . Internal hemorrhoids   . Liver cyst   . Lumbago   . Malignant neoplasm of breast (female), unspecified site   . Nonspecific elevation of levels of transaminase or lactic acid dehydrogenase (LDH)   . Osteoarthrosis, unspecified whether  generalized or localized, unspecified site   . Otalgia, unspecified   . Other and unspecified hyperlipidemia   . Other chronic nonalcoholic liver disease   . Other malaise and fatigue   . Other malaise and fatigue   . Other nonspecific abnormal serum enzyme levels   . Pain in joint, lower leg   . Pain in joint, pelvic region and thigh   . Pain in joint, shoulder region   . Palpitations   . Reflux esophagitis   . Type II or unspecified type diabetes mellitus without mention of complication, not stated as uncontrolled   . Unspecified essential hypertension   . Unspecified vitamin D deficiency    Past Surgical History:  Procedure Laterality Date  . ABDOMINAL HYSTERECTOMY  1981   with appendix removal  . APPENDECTOMY     with hysterectomy  . BREAST CYST EXCISION Left 1982  . BREAST LUMPECTOMY Left 1994   lymph cancer   . CATARACT EXTRACTION, BILATERAL  2016   [place]  . CHOLECYSTECTOMY  1987  . DILATION AND CURETTAGE OF UTERUS  1980  . LUMBAR DISC SURGERY  2003   L4-L5  . LUMBAR SPINE SURGERY  2002  . TOTAL HIP ARTHROPLASTY Bilateral 2003   Family History  Problem Relation Age of Onset  . Heart disease Mother   . Arthritis Mother   . Prostate cancer Father   . Diabetes Brother   . Prostate cancer Brother  Social History   Socioeconomic History  . Marital status: Widowed    Spouse name: None  . Number of children: 3  . Years of education: None  . Highest education level: None  Social Needs  . Financial resource strain: Not hard at all  . Food insecurity - worry: Never true  . Food insecurity - inability: Never true  . Transportation needs - medical: No  . Transportation needs - non-medical: No  Occupational History  . Occupation: retired   Tobacco Use  . Smoking status: Never Smoker  . Smokeless tobacco: Never Used  Substance and Sexual Activity  . Alcohol use: No  . Drug use: No  . Sexual activity: Not Currently  Other Topics Concern  . None  Social  History Narrative   Lives with son and 2 grandsons   Widowed   Never smoked   Alcohol none   Exercise none   No Advance Directive   Still drives    Outpatient Encounter Medications as of 08/14/2017  Medication Sig  . amLODipine (NORVASC) 5 MG tablet One daily to help control BP  . butalbital-acetaminophen-caffeine (FIORICET) 50-325-40 MG tablet Take one or 2 tablets every 6 hours if needed for headache  . cholecalciferol (VITAMIN D) 1000 UNITS tablet Take two tablets once daily  . DULoxetine (CYMBALTA) 20 MG capsule Take 1 capsule (20 mg total) by mouth daily.  Marland Kitchen ELIQUIS 5 MG TABS tablet One twice daily for anticoagulation  . enalapril (VASOTEC) 20 MG tablet TAKE 1 TABLET BY MOUTH TWICE DAILY FOR BLOOD PRESSURE  . HYDROcodone-acetaminophen (NORCO) 10-325 MG tablet Take one tablet by mouth up to four times a day as needed for pain.  Marland Kitchen loperamide (IMODIUM A-D) 2 MG tablet Take one tablet by mouth with each loose stool up to 8 tablets in 24 hours  . LORazepam (ATIVAN) 1 MG tablet Take one tablet by mouth twice daily as needed for anxiety  . metoprolol succinate (TOPROL-XL) 50 MG 24 hr tablet TAKE 1 TABLET BY MOUTH TWICE DAILY FOR BLOOD PRESSURE  . Omega-3 Fatty Acids (FISH OIL PO) Take 1,040 Units by mouth. Take one tablet once daily   Facility-Administered Encounter Medications as of 08/14/2017  Medication  . 0.9 %  sodium chloride infusion    Activities of Daily Living In your present state of health, do you have any difficulty performing the following activities: 08/14/2017  Hearing? Y  Vision? N  Difficulty concentrating or making decisions? Y  Walking or climbing stairs? N  Dressing or bathing? N  Doing errands, shopping? N  Preparing Food and eating ? N  Using the Toilet? N  In the past six months, have you accidently leaked urine? Y  Do you have problems with loss of bowel control? N  Managing your Medications? N  Managing your Finances? N  Housekeeping or managing your  Housekeeping? N  Some recent data might be hidden    Patient Care Team: Lauree Chandler, NP as PCP - General (Geriatric Medicine) Yehuda Mao, MD as Attending Physician (Rheumatology) Frederik Pear, MD as Consulting Physician (Orthopedic Surgery) Crista Luria, MD as Consulting Physician (Dermatology) Gery Pray, MD as Consulting Physician (Radiation Oncology) Mosie Epstein, CRNA (Inactive) as Referring Physician (Anesthesiology) Clent Jacks, MD as Consulting Physician (Ophthalmology)    Assessment:   This is a routine wellness examination for Col[place].  Exercise Activities and Dietary recommendations Current Exercise Habits: The patient does not participate in regular exercise at present, Exercise limited by: None identified  Goals    None      Fall Risk Fall Risk  08/14/2017 07/19/2017 03/16/2017 08/02/2016 07/19/2016  Falls in the past year? Yes No Yes No No  Number falls in past yr: 1 - 1 - -  Injury with Fall? Yes - Yes - -  Comment head laceration, stitches - - - -   Is the patient's home free of loose throw rugs in walkways, pet beds, electrical cords, etc?   yes      Grab bars in the bathroom? yes      Handrails on the stairs?   yes      Adequate lighting?   yes  Timed Get Up and Go performed: 19 seconds, fall risk  Depression Screen PHQ 2/9 Scores 08/14/2017 07/19/2017 08/02/2016 01/20/2016  PHQ - 2 Score 0 2 0 1  PHQ- 9 Score - 6 - -     Cognitive Function MMSE - Mini Mental State Exam 08/14/2017 08/02/2016  Orientation to time 5 5  Orientation to Place 5 5  Registration 3 3  Attention/ Calculation 5 5  Recall 3 3  Language- name 2 objects 2 2  Language- repeat 1 1  Language- follow 3 step command 3 3  Language- read & follow direction 1 1  Write a sentence 1 1  Copy design 1 1  Total score 30 30        Immunization History  Administered Date(s) Administered  . Influenza,inj,Quad PF,6+ Mos 07/29/2015  . Pneumococcal Polysaccharide-23  02/26/2005  . Td 02/26/2005  . Tdap 08/21/2016    Qualifies for Shingles Vaccine? Yes, educated and pt is going to check on the price  Screening Tests Health Maintenance  Topic Date Due  . PNA vac Low Risk Adult (2 of 2 - PCV13) 02/26/2006  . FOOT EXAM  07/28/2016  . OPHTHALMOLOGY EXAM  01/28/2017  . INFLUENZA VACCINE  02/01/2017  . DEXA SCAN  07/05/2023 (Originally 06/19/2000)  . HEMOGLOBIN A1C  09/10/2017  . TETANUS/TDAP  08/21/2026    Cancer Screenings: Lung: Low Dose CT Chest recommended if Age 85-80 years, 30 pack-year currently smoking OR have quit w/in 15years. Patient does not qualify. Breast:  Up to date on Mammogram? Yes   Up to date of Bone Density/Dexa? Yes Colorectal: up to date  Additional Screenings:  Hepatitis B/HIV/Syphillis:not indicated Hepatitis C Screening: declined     Plan:    I have personally reviewed and addressed the Medicare Annual Wellness questionnaire and have noted the following in the patient's chart:  A. Medical and social history B. Use of alcohol, tobacco or illicit drugs  C. Current medications and supplements D. Functional ability and status E.  Nutritional status F.  Physical activity G. Advance directives H. List of other physicians I.  Hospitalizations, surgeries, and ER visits in previous 12 months J.  Riva to include hearing, vision, cognitive, depression L. Referrals and appointments - none  In addition, I have reviewed and discussed with patient certain preventive protocols, quality metrics, and best practice recommendations. A written personalized care plan for preventive services as well as general preventive health recommendations were provided to patient.  See attached scanned questionnaire for additional information.   Signed,   Tyson Dense, RN Nurse Health Advisor   Quick Notes   Health Maintenance: PNA 13 due, declined for today. Foot exam due. Flu vaccine due and declined. Shingrix due- pt  will check price. Pt is going to get eye exam in July  Abnormal Screen: MMSE 30/30, passed clock drawing     Patient Concerns: Go over depression medication. Pt would like ears flushed when she comes in for her next visit     Nurse Concerns: None

## 2017-08-14 NOTE — Patient Instructions (Addendum)
Tamara Yang , Thank you for taking time to come for your Medicare Wellness Visit. I appreciate your ongoing commitment to your health goals. Please review the following plan we discussed and let me know if I can assist you in the future.   Screening recommendations/referrals: Colonoscopy excluded, pt is over 25 Mammogram excluded, pt is over 80 Bone Density up to date Recommended yearly ophthalmology/optometry visit for glaucoma screening and checkup Recommended yearly dental visit for hygiene and checkup  Vaccinations: Influenza vaccine due, declined Pneumococcal vaccine 13 due, declined Tdap vaccine up to date, due 08/21/2026 Shingles vaccine due, please check with pharmacy for price    Advanced directives: Advance directive discussed with you today. I have provided a copy for you to complete at home and have notarized. Once this is complete please bring a copy in to our office so we can scan it into your chart.  Conditions/risks identified: none  Next appointment: Tamara Dense, RN 08/15/2018 @ 1:45pm   Preventive Care 65 Years and Older, Female Preventive care refers to lifestyle choices and visits with your health care provider that can promote health and wellness. What does preventive care include?  A yearly physical exam. This is also called an annual well check.  Dental exams once or twice a year.  Routine eye exams. Ask your health care provider how often you should have your eyes checked.  Personal lifestyle choices, including:  Daily care of your teeth and gums.  Regular physical activity.  Eating a healthy diet.  Avoiding tobacco and drug use.  Limiting alcohol use.  Practicing safe sex.  Taking low-dose aspirin every day.  Taking vitamin and mineral supplements as recommended by your health care provider. What happens during an annual well check? The services and screenings done by your health care provider during your annual well check will depend on your  age, overall health, lifestyle risk factors, and family history of disease. Counseling  Your health care provider may ask you questions about your:  Alcohol use.  Tobacco use.  Drug use.  Emotional well-being.  Home and relationship well-being.  Sexual activity.  Eating habits.  History of falls.  Memory and ability to understand (cognition).  Work and work Statistician.  Reproductive health. Screening  You may have the following tests or measurements:  Height, weight, and BMI.  Blood pressure.  Lipid and cholesterol levels. These may be checked every 5 years, or more frequently if you are over 97 years old.  Skin check.  Lung cancer screening. You may have this screening every year starting at age 91 if you have a 30-pack-year history of smoking and currently smoke or have quit within the past 15 years.  Fecal occult blood test (FOBT) of the stool. You may have this test every year starting at age 38.  Flexible sigmoidoscopy or colonoscopy. You may have a sigmoidoscopy every 5 years or a colonoscopy every 10 years starting at age 38.  Hepatitis C blood test.  Hepatitis B blood test.  Sexually transmitted disease (STD) testing.  Diabetes screening. This is done by checking your blood sugar (glucose) after you have not eaten for a while (fasting). You may have this done every 1-3 years.  Bone density scan. This is done to screen for osteoporosis. You may have this done starting at age 27.  Mammogram. This may be done every 1-2 years. Talk to your health care provider about how often you should have regular mammograms. Talk with your health care provider about your  test results, treatment options, and if necessary, the need for more tests. Vaccines  Your health care provider may recommend certain vaccines, such as:  Influenza vaccine. This is recommended every year.  Tetanus, diphtheria, and acellular pertussis (Tdap, Td) vaccine. You may need a Td booster  every 10 years.  Zoster vaccine. You may need this after age 80.  Pneumococcal 13-valent conjugate (PCV13) vaccine. One dose is recommended after age 24.  Pneumococcal polysaccharide (PPSV23) vaccine. One dose is recommended after age 13. Talk to your health care provider about which screenings and vaccines you need and how often you need them. This information is not intended to replace advice given to you by your health care provider. Make sure you discuss any questions you have with your health care provider. Document Released: 07/17/2015 Document Revised: 03/09/2016 Document Reviewed: 04/21/2015 Elsevier Interactive Patient Education  2017 [place] Prevention in the Home Falls can cause injuries. They can happen to people of all ages. There are many things you can do to make your home safe and to help prevent falls. What can I do on the outside of my home?  Regularly fix the edges of walkways and driveways and fix any cracks.  Remove anything that might make you trip as you walk through a door, such as a raised step or threshold.  Trim any bushes or trees on the path to your home.  Use bright outdoor lighting.  Clear any walking paths of anything that might make someone trip, such as rocks or tools.  Regularly check to see if handrails are loose or broken. Make sure that both sides of any steps have handrails.  Any raised decks and porches should have guardrails on the edges.  Have any leaves, snow, or ice cleared regularly.  Use sand or salt on walking paths during winter.  Clean up any spills in your garage right away. This includes oil or grease spills. What can I do in the bathroom?  Use night lights.  Install grab bars by the toilet and in the tub and shower. Do not use towel bars as grab bars.  Use non-skid mats or decals in the tub or shower.  If you need to sit down in the shower, use a plastic, non-slip stool.  Keep the floor dry. Clean up any  water that spills on the floor as soon as it happens.  Remove soap buildup in the tub or shower regularly.  Attach bath mats securely with double-sided non-slip rug tape.  Do not have throw rugs and other things on the floor that can make you trip. What can I do in the bedroom?  Use night lights.  Make sure that you have a light by your bed that is easy to reach.  Do not use any sheets or blankets that are too big for your bed. They should not hang down onto the floor.  Have a firm chair that has side arms. You can use this for support while you get dressed.  Do not have throw rugs and other things on the floor that can make you trip. What can I do in the kitchen?  Clean up any spills right away.  Avoid walking on wet floors.  Keep items that you use a lot in easy-to-reach places.  If you need to reach something above you, use a strong step stool that has a grab bar.  Keep electrical cords out of the way.  Do not use floor polish or wax that  makes floors slippery. If you must use wax, use non-skid floor wax.  Do not have throw rugs and other things on the floor that can make you trip. What can I do with my stairs?  Do not leave any items on the stairs.  Make sure that there are handrails on both sides of the stairs and use them. Fix handrails that are broken or loose. Make sure that handrails are as long as the stairways.  Check any carpeting to make sure that it is firmly attached to the stairs. Fix any carpet that is loose or worn.  Avoid having throw rugs at the top or bottom of the stairs. If you do have throw rugs, attach them to the floor with carpet tape.  Make sure that you have a light switch at the top of the stairs and the bottom of the stairs. If you do not have them, ask someone to add them for you. What else can I do to help prevent falls?  Wear shoes that:  Do not have high heels.  Have rubber bottoms.  Are comfortable and fit you well.  Are closed  at the toe. Do not wear sandals.  If you use a stepladder:  Make sure that it is fully opened. Do not climb a closed stepladder.  Make sure that both sides of the stepladder are locked into place.  Ask someone to hold it for you, if possible.  Clearly mark and make sure that you can see:  Any grab bars or handrails.  First and last steps.  Where the edge of each step is.  Use tools that help you move around (mobility aids) if they are needed. These include:  Canes.  Walkers.  Scooters.  Crutches.  Turn on the lights when you go into a dark area. Replace any light bulbs as soon as they burn out.  Set up your furniture so you have a clear path. Avoid moving your furniture around.  If any of your floors are uneven, fix them.  If there are any pets around you, be aware of where they are.  Review your medicines with your doctor. Some medicines can make you feel dizzy. This can increase your chance of falling. Ask your doctor what other things that you can do to help prevent falls. This information is not intended to replace advice given to you by your health care provider. Make sure you discuss any questions you have with your health care provider. Document Released: 04/16/2009 Document Revised: 11/26/2015 Document Reviewed: 07/25/2014 Elsevier Interactive Patient Education  2017 [employer].

## 2017-08-18 ENCOUNTER — Encounter: Payer: Self-pay | Admitting: Nurse Practitioner

## 2017-08-18 ENCOUNTER — Ambulatory Visit (INDEPENDENT_AMBULATORY_CARE_PROVIDER_SITE_OTHER): Payer: Medicare HMO | Admitting: Nurse Practitioner

## 2017-08-18 VITALS — BP 110/62 | HR 57 | Temp 98.5°F | Wt 138.0 lb

## 2017-08-18 DIAGNOSIS — R69 Illness, unspecified: Secondary | ICD-10-CM | POA: Diagnosis not present

## 2017-08-18 DIAGNOSIS — M170 Bilateral primary osteoarthritis of knee: Secondary | ICD-10-CM

## 2017-08-18 DIAGNOSIS — F419 Anxiety disorder, unspecified: Secondary | ICD-10-CM | POA: Diagnosis not present

## 2017-08-18 MED ORDER — LORAZEPAM 0.5 MG PO TABS: 0.5000 mg | ORAL_TABLET | Freq: Every day | ORAL | Status: DC | PRN

## 2017-08-18 MED ORDER — HYDROCODONE-ACETAMINOPHEN 5-325 MG PO TABS: 1.0000 | ORAL_TABLET | Freq: Three times a day (TID) | ORAL | 0 refills | Status: DC | PRN

## 2017-08-18 NOTE — Progress Notes (Signed)
Careteam: Patient Care Team: Lauree Chandler, NP as PCP - General (Geriatric Medicine) Yehuda Mao, MD as Attending Physician (Rheumatology) Frederik Pear, MD as Consulting Physician (Orthopedic Surgery) Crista Luria, MD as Consulting Physician (Dermatology) Gery Pray, MD as Consulting Physician (Radiation Oncology) Mosie Epstein, CRNA (Inactive) as Referring Physician (Anesthesiology) Clent Jacks, MD as Consulting Physician (Ophthalmology)  Advanced Directive information    Allergies  Allergen Reactions  . Daypro [Oxaprozin]   . Erythromycin   . Talwin [Pentazocine]     Chief Complaint  Patient presents with  . Medical Management of Chronic Issues    follow-up on mood, check ears     HPI: Patient is a 84 y.o. female seen in the office today for anxiety follow up. Pt was started on Cymbalta last month due to uncontrolled anxiety with depression and increase use of ativan.  Only been taking for 4-5 days. Reports getting up with stomach feeling off. No vomiting.   Recent death at church which got her really upset and she had to take an ativan.  states she got #120 from Dr Nyoka Cowden before he left. Reviewed database Pt got lorazepam #180 it was written in June 2018 but filled June 13, 2017  Discussed reducing hydrocodone- apap- OA major issue in bilateral knees- does not wish to go to ortho for knee injection to minimize hydrocodone- apap Reports she is scared of needles.  Wears a knee brace that helps a lot. If it gets a lot worse she will consider.    Review of Systems:  Review of Systems  Constitutional: Negative for chills, fever and weight loss.  HENT: Negative.   Eyes: Negative for blurred vision, double vision and photophobia.  Respiratory: Negative for cough and shortness of breath.   Cardiovascular: Negative for chest pain and palpitations.  Gastrointestinal: Negative for abdominal pain, constipation, diarrhea and vomiting.  Musculoskeletal:  Positive for back pain, joint pain and myalgias. Negative for falls.  Skin: Negative for rash.  Neurological: Negative for dizziness, speech change, loss of consciousness, weakness and headaches.  Endo/Heme/Allergies: Does not bruise/bleed easily.  Psychiatric/Behavioral: Negative for memory loss and suicidal ideas. The patient is nervous/anxious.     Past Medical History:  Diagnosis Date  . Anxiety state, unspecified   . Carpal tunnel syndrome   . Degeneration of intervertebral disc, site unspecified   . Depressive disorder, not elsewhere classified   . Dysphagia, unspecified(787.20)   . Edema   . Headache(784.0)   . Insomnia, unspecified   . Internal hemorrhoids   . Liver cyst   . Lumbago   . Malignant neoplasm of breast (female), unspecified site   . Nonspecific elevation of levels of transaminase or lactic acid dehydrogenase (LDH)   . Osteoarthrosis, unspecified whether generalized or localized, unspecified site   . Otalgia, unspecified   . Other and unspecified hyperlipidemia   . Other chronic nonalcoholic liver disease   . Other malaise and fatigue   . Other malaise and fatigue   . Other nonspecific abnormal serum enzyme levels   . Pain in joint, lower leg   . Pain in joint, pelvic region and thigh   . Pain in joint, shoulder region   . Palpitations   . Reflux esophagitis   . Type II or unspecified type diabetes mellitus without mention of complication, not stated as uncontrolled   . Unspecified essential hypertension   . Unspecified vitamin D deficiency    Past Surgical History:  Procedure Laterality Date  . ABDOMINAL HYSTERECTOMY  1981   with appendix removal  . APPENDECTOMY     with hysterectomy  . BREAST CYST EXCISION Left 1982  . BREAST LUMPECTOMY Left 1994   lymph cancer   . CATARACT EXTRACTION, BILATERAL  2016   [place]  . CHOLECYSTECTOMY  1987  . DILATION AND CURETTAGE OF UTERUS  1980  . LUMBAR DISC SURGERY  2003   L4-L5  . LUMBAR SPINE SURGERY  2002    . TOTAL HIP ARTHROPLASTY Bilateral 2003   Social History:   reports that  has never smoked. she has never used smokeless tobacco. She reports that she does not drink alcohol or use drugs.  Family History  Problem Relation Age of Onset  . Heart disease Mother   . Arthritis Mother   . Prostate cancer Father   . Diabetes Brother   . Prostate cancer Brother     Medications: Patient's Medications  New Prescriptions   No medications on file  Previous Medications   AMLODIPINE (NORVASC) 5 MG TABLET    One daily to help control BP   BUTALBITAL-ACETAMINOPHEN-CAFFEINE (FIORICET) 50-325-40 MG TABLET    Take one or 2 tablets every 6 hours if needed for headache   CHOLECALCIFEROL (VITAMIN D) 1000 UNITS TABLET    Take two tablets once daily   DULOXETINE (CYMBALTA) 20 MG CAPSULE    Take 1 capsule (20 mg total) by mouth daily.   ELIQUIS 5 MG TABS TABLET    One twice daily for anticoagulation   ENALAPRIL (VASOTEC) 20 MG TABLET    TAKE 1 TABLET BY MOUTH TWICE DAILY FOR BLOOD PRESSURE   HYDROCODONE-ACETAMINOPHEN (NORCO) 10-325 MG TABLET    Take one tablet by mouth up to four times a day as needed for pain.   LOPERAMIDE (IMODIUM A-D) 2 MG TABLET    Take one tablet by mouth with each loose stool up to 8 tablets in 24 hours   LORAZEPAM (ATIVAN) 1 MG TABLET    Take one tablet by mouth twice daily as needed for anxiety   METOPROLOL SUCCINATE (TOPROL-XL) 50 MG 24 HR TABLET    TAKE 1 TABLET BY MOUTH TWICE DAILY FOR BLOOD PRESSURE   OMEGA-3 FATTY ACIDS (FISH OIL PO)    Take 1,040 Units by mouth. Take one tablet once daily  Modified Medications   No medications on file  Discontinued Medications   No medications on file     Physical Exam:  Vitals:   08/18/17 1102  BP: 110/62  Pulse: (!) 57  Temp: 98.5 F (36.9 C)  TempSrc: Oral  SpO2: 97%  Weight: 138 lb (62.6 kg)   Body mass index is 22.96 kg/m.  Physical Exam  Constitutional: She is oriented to person, place, and time. She appears  well-developed and well-nourished. No distress.  HENT:  Head: Normocephalic and atraumatic.  Right Ear: External ear normal.  Left Ear: External ear normal.  Bilateral partial deafness. Ears without wax bilaterally  Eyes: Conjunctivae and EOM are normal. Pupils are equal, round, and reactive to light.  Neck: Normal range of motion. Neck supple. No JVD present. No tracheal deviation present. No thyromegaly present.  Cardiovascular: Normal rate, regular rhythm and normal heart sounds.  Pulmonary/Chest: Effort normal and breath sounds normal. No respiratory distress.  Musculoskeletal: She exhibits tenderness. She exhibits no edema (1+ right lower leg).  Reduced ability to raise her arms. Bilateral shoulder pain. Bilateral knee pains.   Lymphadenopathy:    She has no cervical adenopathy.  Neurological: She is alert and  oriented to person, place, and time. No cranial nerve deficit. Coordination normal.  01/20/16 MMSE 30/30  Skin: Skin is warm and dry. She is not diaphoretic.  Psychiatric: She has a normal mood and affect.    Labs reviewed: Basic Metabolic Panel: Recent Labs    03/13/17 1000 03/28/17 1312 07/19/17 1545  NA 141 139 139  K 4.1 4.6 4.8  CL 108  --  105  CO2 27 28 27   GLUCOSE 95 102 98  BUN 14 13.6 18  CREATININE 0.81 0.8 1.37*  CALCIUM 9.9 10.2 9.8   Liver Function Tests: Recent Labs    11/30/16 1332 03/13/17 1000 03/28/17 1312 07/19/17 1545  AST 36* 27 29 17   ALT 24 25 20 8   ALKPHOS 49  --  60  --   BILITOT 0.72 0.8 0.97 0.6  PROT 6.8 6.5 7.0 6.5  ALBUMIN 3.6  --  3.8  --    No results for input(s): LIPASE, AMYLASE in the last 8760 hours. No results for input(s): AMMONIA in the last 8760 hours. CBC: Recent Labs    11/30/16 1332 03/28/17 1312 07/19/17 1545  WBC 5.7 6.3 6.7  NEUTROABS 3.0 3.3 3,832  HGB 11.3* 12.3 11.3*  HCT 34.5* 37.4 34.2*  MCV 92.6 93.4 91.9  PLT 173 184 248   Lipid Panel: Recent Labs    09/13/16 1402 03/13/17 1000  CHOL  187 158  HDL 47* 62  LDLCALC 119*  --   TRIG 105 87  CHOLHDL 4.0 2.5   TSH: No results for input(s): TSH in the last 8760 hours. A1C: Lab Results  Component Value Date   HGBA1C 5.6 03/13/2017     Assessment/Plan 1. Anxiety -has only been on Cymbalta for a few days, reports waking up with "stomach feeling off" but then states "it may be in my head" otherwise no complaints. Will continue for now and continue ativan as needed  In December got #180, will dose reduce to 0.5 mg tablet and to use daily as needed, new RX was NOT sent to pharmacy.  - LORazepam (ATIVAN) 0.5 MG tablet; Take 1 tablet (0.5 mg total) by mouth daily as needed for anxiety.  2. Primary osteoarthritis of both knees Stable reports she only uses hydrocodone if she absolutely needs it, current Rx for 10/325, will dose reduce to 5-325 mg every 8 hours as needed. States if knee pains worsen she will consider knee injections but for now with her fear of needles does not wish to pursue this.  - HYDROcodone-acetaminophen (NORCO/VICODIN) 5-325 MG tablet; Take 1 tablet by mouth every 8 (eight) hours as needed. For severe pain  Dispense: 90 tablet; Refill: 0   Next appt: 09/14/2017  Carlos American. Harle Battiest  [facility] [phone] 8 am - 5 pm) [phone] (after hours)

## 2017-08-18 NOTE — Patient Instructions (Addendum)
Continue cymbalta 20 mg daily- refill if needed  Follow up in 4 weeks on mood to see how you are doing with it.

## 2017-08-23 ENCOUNTER — Other Ambulatory Visit: Payer: Self-pay | Admitting: *Deleted

## 2017-08-23 DIAGNOSIS — M170 Bilateral primary osteoarthritis of knee: Secondary | ICD-10-CM

## 2017-08-23 MED ORDER — HYDROCODONE-ACETAMINOPHEN 5-325 MG PO TABS: 1.0000 | ORAL_TABLET | Freq: Three times a day (TID) | ORAL | 0 refills | Status: DC | PRN

## 2017-08-23 NOTE — Telephone Encounter (Signed)
Patient called and requested refill [facility] confirmed Pended Rx and sent to [facility] for approval.

## 2017-08-28 ENCOUNTER — Telehealth: Payer: Self-pay

## 2017-08-28 NOTE — Telephone Encounter (Signed)
Returned call to pt regarding appts. Pt states "I'd like to have my appts pushed earlier because I'm in pain". Pt states "my stomach is hurting and it has been for 2 weeks, he told me to see  Him earlier if I needed to so I figured I should". Informed pt of new appts. Appts and instructions for CT to be mailed to pt home. Pt to arrive at Radiology at [place] on 3/11. Liquids only 4 hours prior. Contrast at 0730 and 0830.

## 2017-09-01 DIAGNOSIS — J90 Pleural effusion, not elsewhere classified: Secondary | ICD-10-CM

## 2017-09-01 HISTORY — DX: Pleural effusion, not elsewhere classified: J90

## 2017-09-04 ENCOUNTER — Telehealth: Payer: Self-pay | Admitting: *Deleted

## 2017-09-04 NOTE — Telephone Encounter (Signed)
Call from pt's sister requesting a later time for labs on 3/11. Coordinated with lab receptionist, 0830 appt given. Lab appt needed prior to CT. Sister voiced understanding.

## 2017-09-08 ENCOUNTER — Other Ambulatory Visit: Payer: Self-pay | Admitting: *Deleted

## 2017-09-11 ENCOUNTER — Other Ambulatory Visit: Payer: Medicare HMO

## 2017-09-11 ENCOUNTER — Inpatient Hospital Stay: Payer: Medicare HMO | Attending: Oncology

## 2017-09-11 ENCOUNTER — Ambulatory Visit (HOSPITAL_COMMUNITY): Payer: Medicare HMO

## 2017-09-11 DIAGNOSIS — N1330 Unspecified hydronephrosis: Secondary | ICD-10-CM | POA: Insufficient documentation

## 2017-09-11 DIAGNOSIS — C187 Malignant neoplasm of sigmoid colon: Secondary | ICD-10-CM | POA: Insufficient documentation

## 2017-09-11 DIAGNOSIS — C189 Malignant neoplasm of colon, unspecified: Secondary | ICD-10-CM

## 2017-09-11 DIAGNOSIS — J910 Malignant pleural effusion: Secondary | ICD-10-CM | POA: Insufficient documentation

## 2017-09-11 DIAGNOSIS — I10 Essential (primary) hypertension: Secondary | ICD-10-CM | POA: Diagnosis not present

## 2017-09-11 DIAGNOSIS — C787 Secondary malignant neoplasm of liver and intrahepatic bile duct: Secondary | ICD-10-CM | POA: Diagnosis not present

## 2017-09-11 DIAGNOSIS — Z853 Personal history of malignant neoplasm of breast: Secondary | ICD-10-CM | POA: Diagnosis not present

## 2017-09-11 LAB — BASIC METABOLIC PANEL
Anion gap: 12 — ABNORMAL HIGH (ref 3–11)
BUN: 14 mg/dL (ref 7–26)
CALCIUM: 10.6 mg/dL — AB (ref 8.4–10.4)
CO2: 25 mmol/L (ref 22–29)
CREATININE: 1.44 mg/dL — AB (ref 0.60–1.10)
Chloride: 103 mmol/L (ref 98–109)
GFR calc non Af Amer: 33 mL/min — ABNORMAL LOW (ref >60)
GFR, EST AFRICAN AMERICAN: 38 mL/min — AB (ref >60)
Glucose, Bld: 104 mg/dL (ref 70–140)
Potassium: 4.2 mmol/L (ref 3.5–5.1)
Sodium: 140 mmol/L (ref 136–145)

## 2017-09-12 ENCOUNTER — Ambulatory Visit (HOSPITAL_COMMUNITY)
Admission: RE | Admit: 2017-09-12 | Discharge: 2017-09-12 | Disposition: A | Discharge status: Home / Self Care | Payer: Medicare HMO | Source: Ambulatory Visit | Attending: Oncology | Admitting: Oncology

## 2017-09-12 DIAGNOSIS — C189 Malignant neoplasm of colon, unspecified: Secondary | ICD-10-CM

## 2017-09-12 DIAGNOSIS — I708 Atherosclerosis of other arteries: Secondary | ICD-10-CM | POA: Insufficient documentation

## 2017-09-12 DIAGNOSIS — I348 Other nonrheumatic mitral valve disorders: Secondary | ICD-10-CM | POA: Insufficient documentation

## 2017-09-12 DIAGNOSIS — C786 Secondary malignant neoplasm of retroperitoneum and peritoneum: Secondary | ICD-10-CM | POA: Diagnosis not present

## 2017-09-12 DIAGNOSIS — K7460 Unspecified cirrhosis of liver: Secondary | ICD-10-CM | POA: Insufficient documentation

## 2017-09-12 DIAGNOSIS — J90 Pleural effusion, not elsewhere classified: Secondary | ICD-10-CM | POA: Insufficient documentation

## 2017-09-12 DIAGNOSIS — M48061 Spinal stenosis, lumbar region without neurogenic claudication: Secondary | ICD-10-CM | POA: Diagnosis not present

## 2017-09-12 DIAGNOSIS — N2889 Other specified disorders of kidney and ureter: Secondary | ICD-10-CM | POA: Diagnosis not present

## 2017-09-12 DIAGNOSIS — R188 Other ascites: Secondary | ICD-10-CM | POA: Insufficient documentation

## 2017-09-12 DIAGNOSIS — R590 Localized enlarged lymph nodes: Secondary | ICD-10-CM | POA: Diagnosis not present

## 2017-09-12 DIAGNOSIS — C787 Secondary malignant neoplasm of liver and intrahepatic bile duct: Secondary | ICD-10-CM | POA: Diagnosis not present

## 2017-09-12 MED ORDER — IOPAMIDOL (ISOVUE-300) INJECTION 61%
30.0000 mL | Freq: Once | INTRAVENOUS | Status: AC | PRN
  Administered 2017-09-12: 30 mL via ORAL

## 2017-09-12 MED ORDER — SODIUM CHLORIDE 0.9 % IJ SOLN
INTRAMUSCULAR | Status: AC
  Filled 2017-09-12: qty 50

## 2017-09-12 MED ORDER — IOPAMIDOL (ISOVUE-300) INJECTION 61%
INTRAVENOUS | Status: AC
  Administered 2017-09-12: 100 mL
  Filled 2017-09-12: qty 100

## 2017-09-12 MED ORDER — IOPAMIDOL (ISOVUE-300) INJECTION 61%
INTRAVENOUS | Status: AC
  Filled 2017-09-12: qty 30

## 2017-09-13 ENCOUNTER — Inpatient Hospital Stay (HOSPITAL_BASED_OUTPATIENT_CLINIC_OR_DEPARTMENT_OTHER): Payer: Medicare HMO | Admitting: Oncology

## 2017-09-13 ENCOUNTER — Telehealth: Payer: Self-pay | Admitting: Oncology

## 2017-09-13 VITALS — BP 140/59 | HR 87 | Temp 98.0°F | Resp 15 | Wt 135.8 lb

## 2017-09-13 DIAGNOSIS — J910 Malignant pleural effusion: Secondary | ICD-10-CM | POA: Diagnosis not present

## 2017-09-13 DIAGNOSIS — I10 Essential (primary) hypertension: Secondary | ICD-10-CM | POA: Diagnosis not present

## 2017-09-13 DIAGNOSIS — N1330 Unspecified hydronephrosis: Secondary | ICD-10-CM

## 2017-09-13 DIAGNOSIS — C187 Malignant neoplasm of sigmoid colon: Secondary | ICD-10-CM

## 2017-09-13 DIAGNOSIS — C787 Secondary malignant neoplasm of liver and intrahepatic bile duct: Secondary | ICD-10-CM | POA: Diagnosis not present

## 2017-09-13 DIAGNOSIS — C189 Malignant neoplasm of colon, unspecified: Secondary | ICD-10-CM

## 2017-09-13 DIAGNOSIS — Z853 Personal history of malignant neoplasm of breast: Secondary | ICD-10-CM | POA: Diagnosis not present

## 2017-09-13 NOTE — Addendum Note (Signed)
Addended by: Betsy Coder B on: 09/13/2017 06:29 PM   Modules accepted: Orders

## 2017-09-13 NOTE — Progress Notes (Signed)
[place] OFFICE PROGRESS NOTE   Diagnosis: Colon cancer  INTERVAL HISTORY:   Tamara Yang returns for a scheduled visit.  She has multiple complaints today including exertional dyspnea and postprandial abdominal pain.  She reports intermittent burning sensation in the left lower abdomen.  She notes urinary urgency after voiding. Anxiety is relieved with Ativan.  No vomiting.  No difficulty with bowel function.  Objective:  Vital signs in last 24 hours:  Blood pressure (!) 140/59, pulse 87, temperature 98 F (36.7 C), temperature source Oral, resp. rate 15, weight 135 lb 12.8 oz (61.6 kg), SpO2 99 %.    HEENT: Neck without mass Resp: Decreased breath sounds at the right compared to the left lower chest, no respiratory distress Cardio: Irregular GI: No hepatomegaly, no mass, nontender Vascular: No leg edema    Portacath/PICC-without erythema  Lab Results:  Lab Results  Component Value Date   WBC 6.7 07/19/2017   HGB 11.3 (L) 07/19/2017   HCT 34.2 (L) 07/19/2017   MCV 91.9 07/19/2017   PLT 248 07/19/2017   NEUTROABS 3,832 07/19/2017    CMP     Component Value Date/Time   NA 140 09/11/2017 0845   NA 139 03/28/2017 1312   K 4.2 09/11/2017 0845   K 4.6 03/28/2017 1312   CL 103 09/11/2017 0845   CO2 25 09/11/2017 0845   CO2 28 03/28/2017 1312   GLUCOSE 104 09/11/2017 0845   GLUCOSE 102 03/28/2017 1312   BUN 14 09/11/2017 0845   BUN 13.6 03/28/2017 1312   CREATININE 1.44 (H) 09/11/2017 0845   CREATININE 1.37 (H) 07/19/2017 1545   CREATININE 0.8 03/28/2017 1312   CALCIUM 10.6 (H) 09/11/2017 0845   CALCIUM 10.2 03/28/2017 1312   PROT 6.5 07/19/2017 1545   PROT 7.0 03/28/2017 1312   ALBUMIN 3.8 03/28/2017 1312   AST 17 07/19/2017 1545   AST 29 03/28/2017 1312   ALT 8 07/19/2017 1545   ALT 20 03/28/2017 1312   ALKPHOS 60 03/28/2017 1312   BILITOT 0.6 07/19/2017 1545   BILITOT 0.97 03/28/2017 1312   GFRNONAA 33 (L) 09/11/2017 0845   GFRNONAA  36 (L) 07/19/2017 1545   GFRAA 38 (L) 09/11/2017 0845   GFRAA 42 (L) 07/19/2017 1545      Imaging:  Ct Chest W Contrast  Result Date: 09/13/2017 CLINICAL DATA:  Restaging of metastatic colon cancer. History of breast cancer. EXAM: CT CHEST, ABDOMEN, AND PELVIS WITH CONTRAST TECHNIQUE: Multidetector CT imaging of the chest, abdomen and pelvis was performed following the standard protocol during bolus administration of intravenous contrast. CONTRAST:  141m ISOVUE-300 IOPAMIDOL (ISOVUE-300) INJECTION 61% COMPARISON:  Multiple exams, including 03/28/2017 FINDINGS: CT CHEST FINDINGS Cardiovascular: Coronary, aortic arch, and branch vessel atherosclerotic vascular disease. Calcifications of the mitral valve noted. Mediastinum/Nodes: Small mediastinal lymph nodes are not pathologically enlarged. Lungs/Pleura: Large right and trace left pleural effusions. Mild enhancement along the right enhancement along the right parietal pleural surface and more notably along the right hemidiaphragm compatible with exudative effusion and height main concern for potential pleural tumor. There is new atelectasis along both the major fissure and minor fissure on the right. Calcified granuloma in the left upper lobe, image 60/4. Musculoskeletal: Notable degenerative glenohumeral arthropathy bilaterally. Thoracic kyphosis and mild thoracic spondylosis. Prior left axillary dissection CT ABDOMEN PELVIS FINDINGS Hepatobiliary: Hepatic morphology suggests cirrhosis. There scattered new hypodense masses throughout the liver, appearance favoring metastatic disease over infectious process. Index lesion in segment 2 of the liver measures 1.6  by 1.2 cm on image 45/2 and is new compared to the prior exam. There are also several pre-existing hepatic cysts. Cholecystectomy noted. Pancreas: Unremarkable Spleen: Unremarkable Adrenals/Urinary Tract: The adrenal glands unremarkable although there is a enlarged 10 mm lymph node near the right  adrenal gland on image 50/2. Prominent right hydronephrosis with delayed nephrogram on the right, and right hydroureter extending down to the iliac vessel cross over where there is some indistinct soft tissue density in the expected vicinity of the ureter. The ureter below the iliac vessel cross over is poorly seen due to edema and stranding along tissue planes, and also due to the streak artifact from the patient's bilateral hip implants. The urinary bladder is obscured by streak artifact from the hip implants. Delayed excretion from the right kidney on the delayed phase images. Stomach/Bowel: Anastomotic staple line in the rectum with poor definition of the rectosigmoid junction. The contrast con column in the sigmoid colon is observed but there is only a small amount of dilute contrast medium in the rectum. Posterior to the anastomosis there continues to be a presacral gas density with surrounding thick margination. The amount of gas is minimally reduced from the prior exam but otherwise the region appears similar. Vascular/Lymphatic: Aortoiliac atherosclerotic vascular disease. Right gastric node 0.9 cm in short axis on image 48/2, previously 0.6 cm. Porta hepatis node 1.0 cm in short axis on image 56/2, formerly about 0.7 cm. Reproductive: Prior hysterectomy. Vaginal cuff obscured by streak artifact from the patient's bilateral hip implants. Other: Notable ascites with some extension into the lesser sac. Suspected omental caking of tumor in the left upper quadrant for example image 59/2. There is some enhancement along the peritoneal reflections. High suspicion for peritoneal spread of tumor. Musculoskeletal: Dextroconvex lumbar scoliosis with rotary component. Bilateral hip implants noted. Grade 1 degenerative retrolisthesis at L2-3 and L3-4. Facet arthropathy causes multilevel foraminal impingement in the lumbar spine and there is also central stenosis at the L2-3 level. IMPRESSION: 1. Progressive malignancy  with numerous new hypodense liver lesions favoring metastatic disease; peritoneal spread of tumor with ascites, peritoneal enhancement, and some left upper quadrant omental caking of tumor; and progressive large right pleural effusion with pleural and diaphragmatic enhancement suspicious for malignant effusion. 2. There is obstruction of the right kidney with prominent hydronephrosis and delayed excretion with some soft tissue density in the vicinity of the ureter at about the level of the iliac vessel cross over, possible ureteral metastatic lesion. The distal ureters in urinary bladder obscured by streak artifact from the patient's bilateral hip implants. 3. Persistent presacral gas density with surrounding soft tissue density, fistulous connection to the rectosigmoid anastomosis is not excluded. 4. Other imaging findings of potential clinical significance: Athero coronary atherosclerosis. Mitral valve calcification. Trace left pleural effusion. Advanced degenerative glenohumeral arthropathy. Cirrhosis. Mild upper abdominal adenopathy. Lumbar scoliosis with multilevel foraminal impingement and central stenosis at L2-3. Electronically Signed   By: Van Clines M.D.   On: 09/13/2017 10:18   Ct Abdomen Pelvis W Contrast  Result Date: 09/13/2017 CLINICAL DATA:  Restaging of metastatic colon cancer. History of breast cancer. EXAM: CT CHEST, ABDOMEN, AND PELVIS WITH CONTRAST TECHNIQUE: Multidetector CT imaging of the chest, abdomen and pelvis was performed following the standard protocol during bolus administration of intravenous contrast. CONTRAST:  118m ISOVUE-300 IOPAMIDOL (ISOVUE-300) INJECTION 61% COMPARISON:  Multiple exams, including 03/28/2017 FINDINGS: CT CHEST FINDINGS Cardiovascular: Coronary, aortic arch, and branch vessel atherosclerotic vascular disease. Calcifications of the mitral valve noted. Mediastinum/Nodes: Small  mediastinal lymph nodes are not pathologically enlarged. Lungs/Pleura: Large  right and trace left pleural effusions. Mild enhancement along the right enhancement along the right parietal pleural surface and more notably along the right hemidiaphragm compatible with exudative effusion and height main concern for potential pleural tumor. There is new atelectasis along both the major fissure and minor fissure on the right. Calcified granuloma in the left upper lobe, image 60/4. Musculoskeletal: Notable degenerative glenohumeral arthropathy bilaterally. Thoracic kyphosis and mild thoracic spondylosis. Prior left axillary dissection CT ABDOMEN PELVIS FINDINGS Hepatobiliary: Hepatic morphology suggests cirrhosis. There scattered new hypodense masses throughout the liver, appearance favoring metastatic disease over infectious process. Index lesion in segment 2 of the liver measures 1.6 by 1.2 cm on image 45/2 and is new compared to the prior exam. There are also several pre-existing hepatic cysts. Cholecystectomy noted. Pancreas: Unremarkable Spleen: Unremarkable Adrenals/Urinary Tract: The adrenal glands unremarkable although there is a enlarged 10 mm lymph node near the right adrenal gland on image 50/2. Prominent right hydronephrosis with delayed nephrogram on the right, and right hydroureter extending down to the iliac vessel cross over where there is some indistinct soft tissue density in the expected vicinity of the ureter. The ureter below the iliac vessel cross over is poorly seen due to edema and stranding along tissue planes, and also due to the streak artifact from the patient's bilateral hip implants. The urinary bladder is obscured by streak artifact from the hip implants. Delayed excretion from the right kidney on the delayed phase images. Stomach/Bowel: Anastomotic staple line in the rectum with poor definition of the rectosigmoid junction. The contrast con column in the sigmoid colon is observed but there is only a small amount of dilute contrast medium in the rectum. Posterior to  the anastomosis there continues to be a presacral gas density with surrounding thick margination. The amount of gas is minimally reduced from the prior exam but otherwise the region appears similar. Vascular/Lymphatic: Aortoiliac atherosclerotic vascular disease. Right gastric node 0.9 cm in short axis on image 48/2, previously 0.6 cm. Porta hepatis node 1.0 cm in short axis on image 56/2, formerly about 0.7 cm. Reproductive: Prior hysterectomy. Vaginal cuff obscured by streak artifact from the patient's bilateral hip implants. Other: Notable ascites with some extension into the lesser sac. Suspected omental caking of tumor in the left upper quadrant for example image 59/2. There is some enhancement along the peritoneal reflections. High suspicion for peritoneal spread of tumor. Musculoskeletal: Dextroconvex lumbar scoliosis with rotary component. Bilateral hip implants noted. Grade 1 degenerative retrolisthesis at L2-3 and L3-4. Facet arthropathy causes multilevel foraminal impingement in the lumbar spine and there is also central stenosis at the L2-3 level. IMPRESSION: 1. Progressive malignancy with numerous new hypodense liver lesions favoring metastatic disease; peritoneal spread of tumor with ascites, peritoneal enhancement, and some left upper quadrant omental caking of tumor; and progressive large right pleural effusion with pleural and diaphragmatic enhancement suspicious for malignant effusion. 2. There is obstruction of the right kidney with prominent hydronephrosis and delayed excretion with some soft tissue density in the vicinity of the ureter at about the level of the iliac vessel cross over, possible ureteral metastatic lesion. The distal ureters in urinary bladder obscured by streak artifact from the patient's bilateral hip implants. 3. Persistent presacral gas density with surrounding soft tissue density, fistulous connection to the rectosigmoid anastomosis is not excluded. 4. Other imaging findings  of potential clinical significance: Athero coronary atherosclerosis. Mitral valve calcification. Trace left pleural effusion. Advanced degenerative  glenohumeral arthropathy. Cirrhosis. Mild upper abdominal adenopathy. Lumbar scoliosis with multilevel foraminal impingement and central stenosis at L2-3. Electronically Signed   By: Van Clines M.D.   On: 09/13/2017 10:18    Medications: I have reviewed the patient's current medications.   Assessment/Plan: 1. Metastatic colon cancer ? Sigmoid colectomy, right oophorectomy, omentectomy, and mesenteric implant biopsies at [facility] 09/30/2016 ? Sigmoid colon cancer (T4a,N1b,M1c), MSI-stable, mismatch repair protein expression intact ? Metastatic colon cancer involving the omentum, right ovary, mesenteric nodule biopsies\ ? CT chest/abdomen/pelvis 11/30/2016-no evidence of metastatic disease in the chest, abdomen, pelvis. 5 mm lymph node right of the IVC. Postsurgical fluid collection in the presacral space immediately adjacent to the rectal anastomosis with an air-fluid level. ? Foundation 1-no RASmutation; ERBB2 amplification; microsatellite stable; low tumor mutationalburden ? CTs 03/28/2017-new right pleural irregularity/nodularity, no other evidence of metastatic disease ? CTs 09/12/2017- progressive metastatic disease involving multiple liver lesions, peritoneal tumor, ascites, omental caking in the left upper quadrant, large right pleural effusion, right hydronephrosis 2. Left breast cancer 1994  3. Hypertension  4. Arthritis  5.   Renal insufficiency-CT 09/12/2017 with right hydronephrosis      Disposition: Tamara Yang has metastatic colon cancer.  She has clinical and CT evidence of disease progression.  I discussed the CT findings and reviewed the images with Tamara Yang and her daughter.  She understands no therapy will be curative.  We discussed comfort care/hospice versus a trial of systemic chemotherapy.  She is interested in  considering systemic chemotherapy. We discussed FOLFOX chemotherapy.  We can also consider HER-2 directed therapy since her tumor is HER-2 amplified. The abdominal pain is likely related to carcinomatosis.  She has renal insufficiency and right hydronephrosis.  We will make a referral to urology to consider placement of a ureter stent.  I will refer her for a diagnostic/therapeutic right thoracentesis. Tamara Yang will return for office visit and further discussion on 09/20/2017.  I think it is reasonable to refill prescriptions for hydrocodone to use as needed for abdominal pain and Ativan for anxiety.  I asked her to hold enalapril secondary to the renal insufficiency.  30 minutes were spent with the patient today.  The majority of the time was used for counseling and coordination of care.  Betsy Coder, MD  09/13/2017  3:28 PM

## 2017-09-13 NOTE — Telephone Encounter (Signed)
Scheduled appt per 3/13 los - Gave patient AVS and calender per los.  

## 2017-09-14 ENCOUNTER — Ambulatory Visit (INDEPENDENT_AMBULATORY_CARE_PROVIDER_SITE_OTHER): Payer: Medicare HMO | Admitting: Nurse Practitioner

## 2017-09-14 ENCOUNTER — Encounter: Payer: Self-pay | Admitting: Nurse Practitioner

## 2017-09-14 VITALS — BP 124/70 | HR 74 | Temp 98.4°F | Ht 65.0 in | Wt 136.8 lb

## 2017-09-14 DIAGNOSIS — C189 Malignant neoplasm of colon, unspecified: Secondary | ICD-10-CM

## 2017-09-14 DIAGNOSIS — M8949 Other hypertrophic osteoarthropathy, multiple sites: Secondary | ICD-10-CM

## 2017-09-14 DIAGNOSIS — M159 Polyosteoarthritis, unspecified: Secondary | ICD-10-CM

## 2017-09-14 DIAGNOSIS — M150 Primary generalized (osteo)arthritis: Secondary | ICD-10-CM | POA: Diagnosis not present

## 2017-09-14 DIAGNOSIS — K219 Gastro-esophageal reflux disease without esophagitis: Secondary | ICD-10-CM | POA: Diagnosis not present

## 2017-09-14 DIAGNOSIS — R339 Retention of urine, unspecified: Secondary | ICD-10-CM

## 2017-09-14 DIAGNOSIS — F419 Anxiety disorder, unspecified: Secondary | ICD-10-CM

## 2017-09-14 DIAGNOSIS — R69 Illness, unspecified: Secondary | ICD-10-CM | POA: Diagnosis not present

## 2017-09-14 LAB — POCT URINALYSIS DIPSTICK
APPEARANCE: ABNORMAL
Bilirubin, UA: NEGATIVE
Glucose, UA: NEGATIVE
KETONES UA: NEGATIVE
NITRITE UA: POSITIVE
Spec Grav, UA: 1.01 (ref 1.010–1.025)
Urobilinogen, UA: 0.2 E.U./dL
pH, UA: 6 (ref 5.0–8.0)

## 2017-09-14 MED ORDER — LORAZEPAM 0.5 MG PO TABS: 0.5000 mg | ORAL_TABLET | Freq: Two times a day (BID) | ORAL | Status: DC | PRN

## 2017-09-14 MED ORDER — PANTOPRAZOLE SODIUM 40 MG PO TBEC: 40.0000 mg | DELAYED_RELEASE_TABLET | Freq: Every day | ORAL | 1 refills | Status: AC

## 2017-09-14 NOTE — Patient Instructions (Addendum)
Start protonix 40 mg daily for acid reflux/ingestion  Okay to also use tums as needed for acid reflux/ingestion  Will send urine for culture at this time      Gastroesophageal Reflux Disease, Adult Normally, food travels down the esophagus and stays in the stomach to be digested. However, when a person has gastroesophageal reflux disease (GERD), food and stomach acid move back up into the esophagus. When this happens, the esophagus becomes sore and inflamed. Over time, GERD can create small holes (ulcers) in the lining of the esophagus. What are the causes? This condition is caused by a problem with the muscle between the esophagus and the stomach (lower esophageal sphincter, or LES). Normally, the LES muscle closes after food passes through the esophagus to the stomach. When the LES is weakened or abnormal, it does not close properly, and that allows food and stomach acid to go back up into the esophagus. The LES can be weakened by certain dietary substances, medicines, and medical conditions, including:  Tobacco use.  Pregnancy.  Having a hiatal hernia.  Heavy alcohol use.  Certain foods and beverages, such as coffee, chocolate, onions, and peppermint.  What increases the risk? This condition is more likely to develop in:  People who have an increased body weight.  People who have connective tissue disorders.  People who use NSAID medicines.  What are the signs or symptoms? Symptoms of this condition include:  Heartburn.  Difficult or painful swallowing.  The feeling of having a lump in the throat.  Abitter taste in the mouth.  Bad breath.  Having a large amount of saliva.  Having an upset or bloated stomach.  Belching.  Chest pain.  Shortness of breath or wheezing.  Ongoing (chronic) cough or a night-time cough.  Wearing away of tooth enamel.  Weight loss.  Different conditions can cause chest pain. Make sure to see your health care provider if you  experience chest pain. How is this diagnosed? Your health care provider will take a medical history and perform a physical exam. To determine if you have mild or severe GERD, your health care provider may also monitor how you respond to treatment. You may also have other tests, including:  An endoscopy toexamine your stomach and esophagus with a small camera.  A test thatmeasures the acidity level in your esophagus.  A test thatmeasures how much pressure is on your esophagus.  A barium swallow or modified barium swallow to show the shape, size, and functioning of your esophagus.  How is this treated? The goal of treatment is to help relieve your symptoms and to prevent complications. Treatment for this condition may vary depending on how severe your symptoms are. Your health care provider may recommend:  Changes to your diet.  Medicine.  Surgery.  Follow these instructions at home: Diet  Follow a diet as recommended by your health care provider. This may involve avoiding foods and drinks such as: ? Coffee and tea (with or without caffeine). ? Drinks that containalcohol. ? Energy drinks and sports drinks. ? Carbonated drinks or sodas. ? Chocolate and cocoa. ? Peppermint and mint flavorings. ? Garlic and onions. ? Horseradish. ? Spicy and acidic foods, including peppers, chili powder, curry powder, vinegar, hot sauces, and barbecue sauce. ? Citrus fruit juices and citrus fruits, such as oranges, lemons, and limes. ? Tomato-based foods, such as red sauce, chili, salsa, and pizza with red sauce. ? Fried and fatty foods, such as donuts, french fries, potato chips, and high-fat  dressings. ? High-fat meats, such as hot dogs and fatty cuts of red and white meats, such as rib eye steak, sausage, ham, and bacon. ? High-fat dairy items, such as whole milk, butter, and cream cheese.  Eat small, frequent meals instead of large meals.  Avoid drinking large amounts of liquid with your  meals.  Avoid eating meals during the 2-3 hours before bedtime.  Avoid lying down right after you eat.  Do not exercise right after you eat. General instructions  Pay attention to any changes in your symptoms.  Take over-the-counter and prescription medicines only as told by your health care provider. Do not take aspirin, ibuprofen, or other NSAIDs unless your health care provider told you to do so.  Do not use any tobacco products, including cigarettes, chewing tobacco, and e-cigarettes. If you need help quitting, ask your health care provider.  Wear loose-fitting clothing. Do not wear anything tight around your waist that causes pressure on your abdomen.  Raise (elevate) the head of your bed 6 inches (15cm).  Try to reduce your stress, such as with yoga or meditation. If you need help reducing stress, ask your health care provider.  If you are overweight, reduce your weight to an amount that is healthy for you. Ask your health care provider for guidance about a safe weight loss goal.  Keep all follow-up visits as told by your health care provider. This is important. Contact a health care provider if:  You have new symptoms.  You have unexplained weight loss.  You have difficulty swallowing, or it hurts to swallow.  You have wheezing or a persistent cough.  Your symptoms do not improve with treatment.  You have a hoarse voice. Get help right away if:  You have pain in your arms, neck, jaw, teeth, or back.  You feel sweaty, dizzy, or light-headed.  You have chest pain or shortness of breath.  You vomit and your vomit looks like blood or coffee grounds.  You faint.  Your stool is bloody or black.  You cannot swallow, drink, or eat. This information is not intended to replace advice given to you by your health care provider. Make sure you discuss any questions you have with your health care provider. Document Released: 03/30/2005 Document Revised: 11/18/2015  Document Reviewed: 10/15/2014 Elsevier Interactive Patient Education  [employer].

## 2017-09-14 NOTE — Progress Notes (Signed)
Careteam: Patient Care Team: Lauree Chandler, NP as PCP - General (Geriatric Medicine) Yehuda Mao, MD as Attending Physician (Rheumatology) Frederik Pear, MD as Consulting Physician (Orthopedic Surgery) Crista Luria, MD as Consulting Physician (Dermatology) Gery Pray, MD as Consulting Physician (Radiation Oncology) Mosie Epstein, CRNA (Inactive) as Referring Physician (Anesthesiology) Clent Jacks, MD as Consulting Physician (Ophthalmology)  Advanced Directive information    Allergies  Allergen Reactions  . Daypro [Oxaprozin]   . Erythromycin   . Talwin [Pentazocine]     Chief Complaint  Patient presents with  . Follow-up    4 week F/U mood; Sister Hassan Rowan is with her; Please advise medications list  . Medication Refill    No Refills     HPI: Patient is a 84 y.o. female seen in the office today to follow up anxiety.  Pt started on Cymbalta 20 mg daily at last follow up had only been on medication for several days and then quit does not wish to take medication.  Using Ativan 0.5 mg by mouth twice daily which was reduced from 1 mg, providing benefit at this time.   Having increase pain in her abdomen for the last 3 weeks. Reports she had a routine CT scan scheduled in April however had increase in pain and requested an earlier appt due to this. CT showed progression of disease. Noted to have renal insufficiency and right hydronephrosis. Oncology noted to make referral to urology to consider placement of a ureter stent and referral for diagnostic/therapeutic right thoracentesis- this has been scheduled for 2 pm tomorrow. Increase shortness of breath with any activity and hoping this will help with these symptoms.  Reports urinary symptoms- when she is done urinating feels increase in pressure in lower abdomen and then feels like she needs to sit down and urinate more but will only get a few drops out. No dysuria. No blood in urine.   Enalapril was stopped due to  progressive renal insufficieny and she has stopped this. Blood pressure stable today.   Not eating much, food still taste good but once it reaches her stomach it hurts. Increase GERD/ingestion, burns and sting.  Taking tums which has been beneficial.   Review of Systems:  Review of Systems  Constitutional: Negative for chills, fever and weight loss.  HENT: Negative for tinnitus.   Respiratory: Positive for shortness of breath. Negative for cough and sputum production.   Cardiovascular: Negative for chest pain, palpitations and leg swelling.  Gastrointestinal: Positive for abdominal pain. Negative for constipation, diarrhea and heartburn.       Pain after swallowing- when food gets to stomach  Genitourinary: Positive for frequency. Negative for dysuria and urgency.  Musculoskeletal: Positive for back pain, joint pain and myalgias. Negative for falls.  Skin: Negative.   Neurological: Negative for dizziness and headaches.  Psychiatric/Behavioral: Negative for depression and memory loss. The patient is nervous/anxious. The patient does not have insomnia.     Past Medical History:  Diagnosis Date  . Anxiety state, unspecified   . Carpal tunnel syndrome   . Degeneration of intervertebral disc, site unspecified   . Depressive disorder, not elsewhere classified   . Dysphagia, unspecified(787.20)   . Edema   . Headache(784.0)   . Insomnia, unspecified   . Internal hemorrhoids   . Liver cyst   . Lumbago   . Malignant neoplasm of breast (female), unspecified site   . Nonspecific elevation of levels of transaminase or lactic acid dehydrogenase (LDH)   . Osteoarthrosis,  unspecified whether generalized or localized, unspecified site   . Otalgia, unspecified   . Other and unspecified hyperlipidemia   . Other chronic nonalcoholic liver disease   . Other malaise and fatigue   . Other malaise and fatigue   . Other nonspecific abnormal serum enzyme levels   . Pain in joint, lower leg   . Pain  in joint, pelvic region and thigh   . Pain in joint, shoulder region   . Palpitations   . Reflux esophagitis   . Type II or unspecified type diabetes mellitus without mention of complication, not stated as uncontrolled   . Unspecified essential hypertension   . Unspecified vitamin D deficiency    Past Surgical History:  Procedure Laterality Date  . ABDOMINAL HYSTERECTOMY  1981   with appendix removal  . APPENDECTOMY     with hysterectomy  . BREAST CYST EXCISION Left 1982  . BREAST LUMPECTOMY Left 1994   lymph cancer   . CATARACT EXTRACTION, BILATERAL  2016   [place]  . CHOLECYSTECTOMY  1987  . DILATION AND CURETTAGE OF UTERUS  1980  . LUMBAR DISC SURGERY  2003   L4-L5  . LUMBAR SPINE SURGERY  2002  . TOTAL HIP ARTHROPLASTY Bilateral 2003   Social History:   reports that  has never smoked. she has never used smokeless tobacco. She reports that she does not drink alcohol or use drugs.  Family History  Problem Relation Age of Onset  . Heart disease Mother   . Arthritis Mother   . Prostate cancer Father   . Diabetes Brother   . Prostate cancer Brother     Medications: Patient's Medications  New Prescriptions   No medications on file  Previous Medications   AMLODIPINE (NORVASC) 5 MG TABLET    One daily to help control BP   BUTALBITAL-ACETAMINOPHEN-CAFFEINE (FIORICET) 50-325-40 MG TABLET    Take one or 2 tablets every 6 hours if needed for headache   CHOLECALCIFEROL (VITAMIN D) 1000 UNITS TABLET    Take two tablets once daily   DULOXETINE (CYMBALTA) 20 MG CAPSULE    Take 1 capsule (20 mg total) by mouth daily.   ELIQUIS 5 MG TABS TABLET    One twice daily for anticoagulation   ENALAPRIL (VASOTEC) 20 MG TABLET    TAKE 1 TABLET BY MOUTH TWICE DAILY FOR BLOOD PRESSURE   HYDROCODONE-ACETAMINOPHEN (NORCO/VICODIN) 5-325 MG TABLET    Take 1 tablet by mouth every 8 (eight) hours as needed. For severe pain   LOPERAMIDE (IMODIUM A-D) 2 MG TABLET    Take one tablet by mouth with each  loose stool up to 8 tablets in 24 hours   LORAZEPAM (ATIVAN) 0.5 MG TABLET    Take 1 tablet (0.5 mg total) by mouth daily as needed for anxiety.   METOPROLOL SUCCINATE (TOPROL-XL) 50 MG 24 HR TABLET    TAKE 1 TABLET BY MOUTH TWICE DAILY FOR BLOOD PRESSURE   OMEGA-3 FATTY ACIDS (FISH OIL PO)    Take 1,040 Units by mouth. Take one tablet once daily  Modified Medications   No medications on file  Discontinued Medications   No medications on file     Physical Exam:  Vitals:   09/14/17 1418  BP: 124/70  Pulse: 74  Temp: 98.4 F (36.9 C)  TempSrc: Oral  SpO2: 98%  Weight: 136 lb 12.8 oz (62.1 kg)  Height: 5\' 5"  (1.651 m)   Body mass index is 22.76 kg/m.  Physical Exam  Constitutional: She  is oriented to person, place, and time. She appears well-developed and well-nourished. No distress.  HENT:  Head: Normocephalic and atraumatic.  Mouth/Throat: No oropharyngeal exudate.  Eyes: Conjunctivae are normal. Pupils are equal, round, and reactive to light.  Neck: Normal range of motion. Neck supple.  Cardiovascular: Normal rate, regular rhythm and normal heart sounds.  Pulmonary/Chest: Effort normal.  Diminished in right lower lobe    Abdominal: Soft. Bowel sounds are normal. There is tenderness (suprapubic).  Musculoskeletal: She exhibits no edema or tenderness.  Neurological: She is alert and oriented to person, place, and time.  Skin: Skin is warm and dry. She is not diaphoretic.  Psychiatric: She has a normal mood and affect.    Labs reviewed: Basic Metabolic Panel: Recent Labs    03/13/17 1000 03/28/17 1312 07/19/17 1545 09/11/17 0845  NA 141 139 139 140  K 4.1 4.6 4.8 4.2  CL 108  --  105 103  CO2 27 28 27 25   GLUCOSE 95 102 98 104  BUN 14 13.6 18 14   CREATININE 0.81 0.8 1.37* 1.44*  CALCIUM 9.9 10.2 9.8 10.6*   Liver Function Tests: Recent Labs    11/30/16 1332 03/13/17 1000 03/28/17 1312 07/19/17 1545  AST 36* 27 29 17   ALT 24 25 20 8   ALKPHOS 49  --   60  --   BILITOT 0.72 0.8 0.97 0.6  PROT 6.8 6.5 7.0 6.5  ALBUMIN 3.6  --  3.8  --    No results for input(s): LIPASE, AMYLASE in the last 8760 hours. No results for input(s): AMMONIA in the last 8760 hours. CBC: Recent Labs    11/30/16 1332 03/28/17 1312 07/19/17 1545  WBC 5.7 6.3 6.7  NEUTROABS 3.0 3.3 3,832  HGB 11.3* 12.3 11.3*  HCT 34.5* 37.4 34.2*  MCV 92.6 93.4 91.9  PLT 173 184 248   Lipid Panel: Recent Labs    03/13/17 1000  CHOL 158  HDL 62  LDLCALC 79  TRIG 87  CHOLHDL 2.5   TSH: No results for input(s): TSH in the last 8760 hours. A1C: Lab Results  Component Value Date   HGBA1C 5.6 03/13/2017     Assessment/Plan 1. Gastroesophageal reflux disease without esophagitis Abdominal discomfort associated with eating, hx of GERD that she has not had in years. Continue tums PRN. - pantoprazole (PROTONIX) 40 MG tablet; Take 1 tablet (40 mg total) by mouth daily.  Dispense: 30 tablet; Refill: 1  2. Urinary retention - POCT urinalysis dipstick abnormal. Encouraged to increase hydration and will send for culture - Culture, Urine; Future - Culture, Urine  3. Anxiety -ongoing anxiety, will not take Cymbalta because she feels like it is only indicated for depression, went into detail on the adverse effects of ativan however doing well with twice daily dosing and aware of risk. States taking twice daily has been helping with anxiety.  - LORazepam (ATIVAN) 0.5 MG tablet; Take 1 tablet (0.5 mg total) by mouth 2 (two) times daily as needed for anxiety.  Dispense: 30 tablet  4. Malignant neoplasm of colon, unspecified part of colon ([place]) progressive metastatic disease involving multiple liver lesions, peritoneal tumor, ascites, omental caking in the left upper quadrant, large right pleural effusion, right hydronephrosis, urology referral placed and plans for thoracentesis  5. Primary osteoarthritis involving multiple joints -primarily bilateral knees, continues to use  brace for support and cane -continues hydrocodone/apap PRN   Next appt: 12/18/2017 Janett Billow K[facility] [place], [facility] [phone]

## 2017-09-15 ENCOUNTER — Ambulatory Visit (HOSPITAL_COMMUNITY): Payer: Medicare HMO

## 2017-09-15 ENCOUNTER — Ambulatory Visit (HOSPITAL_COMMUNITY)
Admission: RE | Admit: 2017-09-15 | Discharge: 2017-09-15 | Disposition: A | Discharge status: Home / Self Care | Payer: Medicare HMO | Source: Ambulatory Visit | Attending: Oncology | Admitting: Oncology

## 2017-09-15 ENCOUNTER — Ambulatory Visit (HOSPITAL_COMMUNITY): Admission: RE | Admit: 2017-09-15 | Payer: Medicare HMO | Source: Ambulatory Visit

## 2017-09-15 ENCOUNTER — Ambulatory Visit (HOSPITAL_COMMUNITY)
Admission: RE | Admit: 2017-09-15 | Discharge: 2017-09-15 | Disposition: A | Discharge status: Home / Self Care | Payer: Medicare HMO | Source: Ambulatory Visit | Attending: Radiology | Admitting: Radiology

## 2017-09-15 DIAGNOSIS — J90 Pleural effusion, not elsewhere classified: Secondary | ICD-10-CM

## 2017-09-15 DIAGNOSIS — Z9889 Other specified postprocedural states: Secondary | ICD-10-CM | POA: Insufficient documentation

## 2017-09-15 DIAGNOSIS — J910 Malignant pleural effusion: Secondary | ICD-10-CM | POA: Diagnosis not present

## 2017-09-15 DIAGNOSIS — C189 Malignant neoplasm of colon, unspecified: Secondary | ICD-10-CM | POA: Diagnosis not present

## 2017-09-15 DIAGNOSIS — C384 Malignant neoplasm of pleura: Secondary | ICD-10-CM | POA: Diagnosis not present

## 2017-09-15 HISTORY — PX: THORACENTESIS: SHX235

## 2017-09-15 MED ORDER — LIDOCAINE HCL (PF) 1 % IJ SOLN
INTRAMUSCULAR | Status: AC
  Filled 2017-09-15: qty 30

## 2017-09-15 NOTE — Procedures (Signed)
  US guided Rt Thoracentesis  1 L yellow fluid Tolerated well  cxr pending

## 2017-09-16 LAB — URINE CULTURE
MICRO NUMBER: 90326634
SPECIMEN QUALITY: ADEQUATE

## 2017-09-18 ENCOUNTER — Other Ambulatory Visit: Payer: Self-pay | Admitting: Nurse Practitioner

## 2017-09-18 ENCOUNTER — Telehealth: Payer: Self-pay | Admitting: *Deleted

## 2017-09-18 MED ORDER — AMOXICILLIN-POT CLAVULANATE 875-125 MG PO TABS: 1.0000 | ORAL_TABLET | Freq: Two times a day (BID) | ORAL | 0 refills | Status: DC

## 2017-09-18 NOTE — Telephone Encounter (Signed)
Called pt, she has not heard from urology re: referral. She reports her PCP prescribed antibiotic for her.  [facility], appt scheduled for 3/19 @ 1330 with Dr. Karsten Ro. Called pt with appt, she requested I call her sister, Hassan Rowan. Left message on voicemail for [place]. 1420: Sister returned call, informed her of appt. Notes, labs and imaging faxed electronically.

## 2017-09-18 NOTE — Telephone Encounter (Signed)
-----   Message from Ladell Pier, MD sent at 09/15/2017  6:33 PM EDT ----- Please call patient, has she been scheduled for a Urology appt.?

## 2017-09-19 DIAGNOSIS — N130 Hydronephrosis with ureteropelvic junction obstruction: Secondary | ICD-10-CM | POA: Diagnosis not present

## 2017-09-19 DIAGNOSIS — N131 Hydronephrosis with ureteral stricture, not elsewhere classified: Secondary | ICD-10-CM | POA: Diagnosis not present

## 2017-09-20 ENCOUNTER — Inpatient Hospital Stay (HOSPITAL_BASED_OUTPATIENT_CLINIC_OR_DEPARTMENT_OTHER): Payer: Medicare HMO | Admitting: Oncology

## 2017-09-20 ENCOUNTER — Telehealth: Payer: Self-pay | Admitting: Oncology

## 2017-09-20 ENCOUNTER — Inpatient Hospital Stay: Payer: Medicare HMO

## 2017-09-20 VITALS — BP 136/69 | HR 79 | Temp 98.3°F | Resp 18 | Ht 65.0 in | Wt 136.8 lb

## 2017-09-20 DIAGNOSIS — C787 Secondary malignant neoplasm of liver and intrahepatic bile duct: Secondary | ICD-10-CM

## 2017-09-20 DIAGNOSIS — C187 Malignant neoplasm of sigmoid colon: Secondary | ICD-10-CM | POA: Diagnosis not present

## 2017-09-20 DIAGNOSIS — I10 Essential (primary) hypertension: Secondary | ICD-10-CM

## 2017-09-20 DIAGNOSIS — C189 Malignant neoplasm of colon, unspecified: Secondary | ICD-10-CM

## 2017-09-20 DIAGNOSIS — J910 Malignant pleural effusion: Secondary | ICD-10-CM | POA: Diagnosis not present

## 2017-09-20 DIAGNOSIS — N1330 Unspecified hydronephrosis: Secondary | ICD-10-CM | POA: Diagnosis not present

## 2017-09-20 DIAGNOSIS — Z853 Personal history of malignant neoplasm of breast: Secondary | ICD-10-CM | POA: Diagnosis not present

## 2017-09-20 LAB — CBC WITH DIFFERENTIAL (CANCER CENTER ONLY)
BASOS PCT: 0 %
Basophils Absolute: 0 10*3/uL (ref 0.0–0.1)
Eosinophils Absolute: 0.1 10*3/uL (ref 0.0–0.5)
Eosinophils Relative: 1 %
HEMATOCRIT: 36.8 % (ref 34.8–46.6)
Hemoglobin: 12.1 g/dL (ref 11.6–15.9)
LYMPHS PCT: 18 %
Lymphs Abs: 1.4 10*3/uL (ref 0.9–3.3)
MCH: 30.1 pg (ref 25.1–34.0)
MCHC: 32.8 g/dL (ref 31.5–36.0)
MCV: 91.8 fL (ref 79.5–101.0)
MONOS PCT: 6 %
Monocytes Absolute: 0.4 10*3/uL (ref 0.1–0.9)
NEUTROS ABS: 5.6 10*3/uL (ref 1.5–6.5)
Neutrophils Relative %: 75 %
Platelet Count: 330 10*3/uL (ref 145–400)
RBC: 4.01 MIL/uL (ref 3.70–5.45)
RDW: 13.9 % (ref 11.2–14.5)
WBC Count: 7.4 10*3/uL (ref 3.9–10.3)

## 2017-09-20 LAB — CMP (CANCER CENTER ONLY)
ALBUMIN: 2.9 g/dL — AB (ref 3.5–5.0)
ALK PHOS: 57 U/L (ref 40–150)
ALT: 7 U/L (ref 0–55)
ANION GAP: 9 (ref 3–11)
AST: 19 U/L (ref 5–34)
BUN: 21 mg/dL (ref 7–26)
CALCIUM: 10.4 mg/dL (ref 8.4–10.4)
CO2: 26 mmol/L (ref 22–29)
Chloride: 105 mmol/L (ref 98–109)
Creatinine: 1.55 mg/dL — ABNORMAL HIGH (ref 0.60–1.10)
GFR, Est AFR Am: 35 mL/min — ABNORMAL LOW (ref >60)
GFR, Estimated: 30 mL/min — ABNORMAL LOW (ref >60)
GLUCOSE: 128 mg/dL (ref 70–140)
Potassium: 5 mmol/L (ref 3.5–5.1)
SODIUM: 140 mmol/L (ref 136–145)
Total Bilirubin: 0.6 mg/dL (ref 0.2–1.2)
Total Protein: 6.8 g/dL (ref 6.4–8.3)

## 2017-09-20 LAB — CEA (IN HOUSE-CHCC): CEA (CHCC-In House): 1212.75 ng/mL — ABNORMAL HIGH (ref 0.00–5.00)

## 2017-09-20 NOTE — Progress Notes (Signed)
[place] OFFICE PROGRESS NOTE   Diagnosis: Colon cancer  INTERVAL HISTORY:   Tamara Yang returns as scheduled.  She underwent an ultrasound right thoracentesis on 09/15/2017.  1 L of fluid was removed.  The cytology returned positive for metastatic adenocarcinoma consistent with a colon primary. She reports improvement in dyspnea following this procedure.  She continues to have upper abdominal pain. She was evaluated by urology for the right hydronephrosis.  She is being scheduled for stent placement, but is not sure whether she would like to go through with this procedure. Objective:  Vital signs in last 24 hours:  Blood pressure 136/69, pulse 79, temperature 98.3 F (36.8 C), temperature source Oral, resp. rate 18, height '5\' 5"'$  (1.651 m), weight 136 lb 12.8 oz (62.1 kg), SpO2 100 %.    Resp: Lungs clear bilaterally, no respiratory distress Cardio: Regular rate and rhythm GI: Mild fullness in the upper abdomen without a discrete mass.  No hepatomegaly.  No apparent ascites. Vascular: No leg edema  Lab Results:  Lab Results  Component Value Date   WBC 7.4 09/20/2017   HGB 11.3 (L) 07/19/2017   HCT 36.8 09/20/2017   MCV 91.8 09/20/2017   PLT 330 09/20/2017   NEUTROABS 5.6 09/20/2017    CMP     Component Value Date/Time   NA 140 09/20/2017 1103   NA 139 03/28/2017 1312   K 5.0 09/20/2017 1103   K 4.6 03/28/2017 1312   CL 105 09/20/2017 1103   CO2 26 09/20/2017 1103   CO2 28 03/28/2017 1312   GLUCOSE 128 09/20/2017 1103   GLUCOSE 102 03/28/2017 1312   BUN 21 09/20/2017 1103   BUN 13.6 03/28/2017 1312   CREATININE 1.55 (H) 09/20/2017 1103   CREATININE 1.37 (H) 07/19/2017 1545   CREATININE 0.8 03/28/2017 1312   CALCIUM 10.4 09/20/2017 1103   CALCIUM 10.2 03/28/2017 1312   PROT 6.8 09/20/2017 1103   PROT 7.0 03/28/2017 1312   ALBUMIN 2.9 (L) 09/20/2017 1103   ALBUMIN 3.8 03/28/2017 1312   AST 19 09/20/2017 1103   AST 29 03/28/2017 1312   ALT 7  09/20/2017 1103   ALT 20 03/28/2017 1312   ALKPHOS 57 09/20/2017 1103   ALKPHOS 60 03/28/2017 1312   BILITOT 0.6 09/20/2017 1103   BILITOT 0.97 03/28/2017 1312   GFRNONAA 30 (L) 09/20/2017 1103   GFRNONAA 36 (L) 07/19/2017 1545   GFRAA 35 (L) 09/20/2017 1103   GFRAA 42 (L) 07/19/2017 1545    Lab Results  Component Value Date   CEA1 408.85 (H) 07/06/2017    Medications: I have reviewed the patient's current medications.   Assessment/Plan: 1. Metastatic colon cancer ? Sigmoid colectomy, right oophorectomy, omentectomy, and mesenteric implant biopsies at [facility] 09/30/2016 ? Sigmoid colon cancer (T4a,N1b,M1c), MSI-stable, mismatch repair protein expression intact ? Metastatic colon cancer involving the omentum, right ovary, mesenteric nodule biopsies\ ? CT chest/abdomen/pelvis 11/30/2016-no evidence of metastatic disease in the chest, abdomen, pelvis. 5 mm lymph node right of the IVC. Postsurgical fluid collection in the presacral space immediately adjacent to the rectal anastomosis with an air-fluid level. ? Foundation 1-no RASmutation; ERBB2 amplification; microsatellite stable; low tumor mutationalburden ? CTs 03/28/2017-new right pleural irregularity/nodularity, no other evidence of metastatic disease ? CTs 09/12/2017- progressive metastatic disease involving multiple liver lesions, peritoneal tumor, ascites, omental caking in the left upper quadrant, large right pleural effusion, right hydronephrosis ? Right thoracentesis 09/15/2017- metastatic adenocarcinoma consistent with a colon primary 2. Left breast cancer 1994  3. Hypertension  4. Arthritis  5.   Renal insufficiency-CT 09/12/2017 with right hydronephrosis    Disposition: Tamara Yang has metastatic colon cancer.  There is been recent clinical and CT evidence of disease progression.  She obtained relief of dyspnea with a therapeutic right thoracentesis.  I discussed the prognosis and treatment options with Ms.  Yang and her family.  She understands no therapy will be curative.  We discussed systemic therapy options versus supportive care.  She is unsure whether she would like to proceed with a trial of systemic therapy.  We specifically discussed FOLFOX, capecitabine, and capecitabine/Avastin.  We reviewed the potential toxicities associated with capecitabine and Avastin in detail.  Her initial indication is that she does not wish to receive FOLFOX.  She would like to consider options of supportive care versus a trial of capecitabine/Avastin.  She will return for an office visit and further discussion on 09/27/2017.  She will be scheduled for a chemotherapy teaching class on 09/27/2017. She is being scheduled for placement of a right ureter stent, but they declined this procedure if she decides against chemotherapy.  Her renal function is currently borderline to receive capecitabine.  I will encourage her to undergo placement of a stent if she decides on a trial of capecitabine.  25 minutes were spent with the patient today.  The majority of the time was used for counseling and coordination of care.  Betsy Coder, MD  09/20/2017  12:01 PM

## 2017-09-20 NOTE — Telephone Encounter (Signed)
Appointments scheduled AVS/Calendar printed per 3/20 los 

## 2017-09-22 ENCOUNTER — Other Ambulatory Visit: Payer: Self-pay

## 2017-09-22 DIAGNOSIS — M170 Bilateral primary osteoarthritis of knee: Secondary | ICD-10-CM

## 2017-09-22 MED ORDER — HYDROCODONE-ACETAMINOPHEN 5-325 MG PO TABS: 1.0000 | ORAL_TABLET | Freq: Three times a day (TID) | ORAL | 0 refills | Status: DC | PRN

## 2017-09-22 NOTE — Telephone Encounter (Signed)
Patient called to request a refill of hydrocodone/apap 5-325 mg. Prescription was pended to provider after verifying last fill date, quantity, and provider on the [place] data base.

## 2017-09-27 ENCOUNTER — Encounter: Payer: Self-pay | Admitting: Nurse Practitioner

## 2017-09-27 ENCOUNTER — Inpatient Hospital Stay: Payer: Medicare HMO

## 2017-09-27 ENCOUNTER — Telehealth: Payer: Self-pay | Admitting: Nurse Practitioner

## 2017-09-27 ENCOUNTER — Inpatient Hospital Stay (HOSPITAL_BASED_OUTPATIENT_CLINIC_OR_DEPARTMENT_OTHER): Payer: Medicare HMO | Admitting: Nurse Practitioner

## 2017-09-27 VITALS — BP 159/78 | HR 87 | Temp 98.0°F | Resp 20 | Wt 138.9 lb

## 2017-09-27 DIAGNOSIS — I10 Essential (primary) hypertension: Secondary | ICD-10-CM | POA: Diagnosis not present

## 2017-09-27 DIAGNOSIS — C187 Malignant neoplasm of sigmoid colon: Secondary | ICD-10-CM

## 2017-09-27 DIAGNOSIS — N1330 Unspecified hydronephrosis: Secondary | ICD-10-CM

## 2017-09-27 DIAGNOSIS — C787 Secondary malignant neoplasm of liver and intrahepatic bile duct: Secondary | ICD-10-CM | POA: Diagnosis not present

## 2017-09-27 DIAGNOSIS — C189 Malignant neoplasm of colon, unspecified: Secondary | ICD-10-CM

## 2017-09-27 DIAGNOSIS — J910 Malignant pleural effusion: Secondary | ICD-10-CM

## 2017-09-27 DIAGNOSIS — Z853 Personal history of malignant neoplasm of breast: Secondary | ICD-10-CM | POA: Diagnosis not present

## 2017-09-27 DIAGNOSIS — Z7189 Other specified counseling: Secondary | ICD-10-CM | POA: Insufficient documentation

## 2017-09-27 MED ORDER — OXYCODONE-ACETAMINOPHEN 5-325 MG PO TABS
1.0000 | ORAL_TABLET | Freq: Four times a day (QID) | ORAL | 0 refills | Status: AC | PRN
Start: 2017-09-27 — End: ?

## 2017-09-27 NOTE — Progress Notes (Signed)
START ON PATHWAY REGIMEN - Colorectal     A cycle is every 14 days:     Oxaliplatin      Leucovorin      5-Fluorouracil      5-Fluorouracil      Bevacizumab   **Always confirm dose/schedule in your pharmacy ordering system**    Patient Characteristics: Metastatic Colorectal, First Line, Nonsurgical Candidate, KRAS/NRAS Wild-Type, BRAF Wild-Type/Unknown, PS = 0,1; Bevacizumab Eligible Current evidence of distant metastases<= Yes AJCC T Category: Staged < 8th Ed. AJCC N Category: Staged < 8th Ed. AJCC M Category: Staged < 8th Ed. AJCC 8 Stage Grouping: Staged < 8th Ed. BRAF Mutation Status: Awaiting Test Results KRAS/NRAS Mutation Status: Wild Type (no mutation) Line of therapy: First Environmental consultant Status: PS = 0, 1 Bevacizumab Eligibility: Eligible Intent of Therapy: Non-Curative / Palliative Intent, Discussed with Patient

## 2017-09-27 NOTE — Telephone Encounter (Signed)
Scheduled appt per 3/27 los - unable to schedule for 4/8 or 4/9 due to cap - logged - will contact patient when appts scheduled.

## 2017-09-27 NOTE — Progress Notes (Addendum)
[place] OFFICE PROGRESS NOTE   Diagnosis:  Colon cancer  INTERVAL HISTORY:   She continues to have right flank and abdominal pain.  Hydrocodone is partially effective.  Last bowel movement 2 days ago.  No nausea.  Appetite varies.  Objective:  Vital signs in last 24 hours:  Blood pressure (!) 159/78, pulse 87, temperature 98 F (36.7 C), temperature source Oral, resp. rate 20, weight 138 lb 14.4 oz (63 kg), SpO2 99 %.    HEENT: No thrush or ulcers. Resp: Lungs clear bilaterally. Cardio: Regular rate and rhythm. GI: Fullness in the upper abdomen.  No hepatomegaly.  No apparent ascites. Vascular: No leg edema. Neuro: Alert and oriented. Skin: No rash.   Lab Results:  Lab Results  Component Value Date   WBC 7.4 09/20/2017   HGB 11.3 (L) 07/19/2017   HCT 36.8 09/20/2017   MCV 91.8 09/20/2017   PLT 330 09/20/2017   NEUTROABS 5.6 09/20/2017    Imaging:  No results found.  Medications: I have reviewed the patient's current medications.  Assessment/Plan: 1. Metastatic colon cancer ? Sigmoid colectomy, right oophorectomy, omentectomy, and mesenteric implant biopsies at [facility] 09/30/2016 ? Sigmoid colon cancer (T4a,N1b,M1c), MSI-stable, mismatch repair protein expression intact ? Metastatic colon cancer involving the omentum, right ovary, mesenteric nodule biopsies\ ? CT chest/abdomen/pelvis 11/30/2016-no evidence of metastatic disease in the chest, abdomen, pelvis. 5 mm lymph node right of the IVC. Postsurgical fluid collection in the presacral space immediately adjacent to the rectal anastomosis with an air-fluid level. ? Foundation 1-no RASmutation; ERBB2 amplification; microsatellite stable; low tumor mutationalburden ? CTs 03/28/2017-new right pleural irregularity/nodularity, no other evidence of metastatic disease ? CTs 09/12/2017- progressive metastatic disease involving multiple liver lesions, peritoneal tumor, ascites, omental caking in the left  upper quadrant, large right pleural effusion, right hydronephrosis ? Right thoracentesis 09/15/2017- metastatic adenocarcinoma consistent with a colon primary   2. Left breast cancer 1994  3. Hypertension  4. Arthritis  5.Renal insufficiency-CT 09/12/2017 with right hydronephrosis    Disposition: Tamara Yang appears unchanged.  She has decided to proceed with a trial of systemic therapy on the FOLFOX regimen.  We again reviewed potential toxicities associated with Oxaliplatin including cold sensitivity, peripheral neuropathy, acute laryngopharyngeal dysesthesia, more rare occurrences such as diplopia, ataxia, incontinence.  She agrees to proceed.  She is attending the chemotherapy education class today.  She understands this regimen requires placement of a Port-A-Cath.  We made a referral to interventional radiology.  She will contact the urology office to schedule right ureter stent placement.    Her pain is not well controlled with hydrocodone.  She was given a prescription for Percocet 1-2 tablets every 6 hours as needed.  She understands to discontinue hydrocodone.  She will return for lab, follow-up and cycle 1 FOLFOX on 10/09/2017.  She will contact the office in the interim with any problems.  Patient seen with Dr. Benay Spice.  25 minutes were spent face-to-face at today's visit with the majority of that time involved in counseling/coordination of care.    Ned Card ANP/GNP-BC   09/27/2017  1:10 PM  This was a shared visit with Ned Card.  Tamara Yang has metastatic colon cancer.  She has decided to proceed with systemic chemotherapy.  She agrees to FOLFOX.  We reviewed the potential toxicities associated with the FOLFOX regimen.  She will attended chemotherapy teaching class today. Ms. Kurtenbach will schedule placement of a ureter stent with Dr. Karsten Ro.  We will refer her for Port-A-Cath  placement.  The plan is to begin cycle 1 FOLFOX on 10/09/2017.  Julieanne Manson,  MD

## 2017-09-28 ENCOUNTER — Telehealth: Payer: Self-pay

## 2017-09-28 ENCOUNTER — Other Ambulatory Visit: Payer: Self-pay

## 2017-09-28 DIAGNOSIS — C187 Malignant neoplasm of sigmoid colon: Secondary | ICD-10-CM

## 2017-09-28 MED ORDER — PROCHLORPERAZINE MALEATE 5 MG PO TABS: 5.0000 mg | ORAL_TABLET | Freq: Four times a day (QID) | ORAL | 0 refills | Status: AC | PRN

## 2017-09-28 MED ORDER — LIDOCAINE-PRILOCAINE 2.5-2.5 % EX CREA: TOPICAL_CREAM | CUTANEOUS | 0 refills | Status: AC

## 2017-09-28 NOTE — Telephone Encounter (Signed)
Called patient and left message with appt dates and time.

## 2017-09-28 NOTE — Telephone Encounter (Signed)
Call from [place] at [facility] stating that pt had hydrocodone filled 6 days ago and is wondering do we want the oxycodone filled as well? Per Lattie Haw, NP note from yesterday's visit pt stated "the hyrdocodone isn't working well". Lattie Haw, NP is switching pt to oxycodone and states that pt is aware to discontinue the hydrocodone. Estill Bamberg voiced understanding.

## 2017-09-29 ENCOUNTER — Telehealth (HOSPITAL_COMMUNITY): Payer: Self-pay | Admitting: Radiology

## 2017-09-29 ENCOUNTER — Encounter: Payer: Self-pay | Admitting: Oncology

## 2017-09-29 ENCOUNTER — Telehealth: Payer: Self-pay | Admitting: *Deleted

## 2017-09-29 NOTE — Telephone Encounter (Signed)
Yes this is okay 

## 2017-09-29 NOTE — Telephone Encounter (Signed)
Anderson Malta at Interventional Radiology notified.

## 2017-09-29 NOTE — Progress Notes (Signed)
Submitted auth request for Percocet and Lidocaine-Prilocaine cream today.  Status is pending.

## 2017-09-29 NOTE — Telephone Encounter (Signed)
[name] with Interventional Radiology from [facility] called and stated that patient is coming in next week to have a Porta Cath placed and patient will need to stop her Eliquis on Sunday prior to the procedure. Wants an ok to do this. Please Advise.

## 2017-09-29 NOTE — Telephone Encounter (Signed)
Rodena Piety from [facility] called to let IR know that per Sherrie Mustache, NP, Ms Depoy can hold her eliquis for 2 days prior to her IR procedure.

## 2017-10-01 ENCOUNTER — Other Ambulatory Visit: Payer: Self-pay | Admitting: Radiology

## 2017-10-02 ENCOUNTER — Other Ambulatory Visit: Payer: Self-pay | Admitting: Internal Medicine

## 2017-10-02 ENCOUNTER — Encounter: Payer: Self-pay | Admitting: Oncology

## 2017-10-02 ENCOUNTER — Other Ambulatory Visit: Payer: Self-pay | Admitting: Urology

## 2017-10-02 NOTE — Progress Notes (Signed)
Pt's Percocet and Lidocaine-Prilocaine were denied.  Gave denial to the nurse.

## 2017-10-03 ENCOUNTER — Other Ambulatory Visit (HOSPITAL_COMMUNITY): Payer: Medicare HMO

## 2017-10-03 ENCOUNTER — Ambulatory Visit (HOSPITAL_COMMUNITY): Payer: Medicare HMO

## 2017-10-03 ENCOUNTER — Other Ambulatory Visit: Payer: Self-pay | Admitting: *Deleted

## 2017-10-03 DIAGNOSIS — F419 Anxiety disorder, unspecified: Secondary | ICD-10-CM

## 2017-10-03 MED ORDER — LORAZEPAM 0.5 MG PO TABS: 0.5000 mg | ORAL_TABLET | Freq: Two times a day (BID) | ORAL | 0 refills | Status: AC | PRN

## 2017-10-03 NOTE — Telephone Encounter (Signed)
Patient sister, Hassan Rowan requested refill for patient.  [place] Verified LR: 06/13/2017 Pharmacy confirmed. Called in Rx to pharmacist.

## 2017-10-04 ENCOUNTER — Other Ambulatory Visit: Payer: Self-pay | Admitting: Radiology

## 2017-10-04 ENCOUNTER — Other Ambulatory Visit: Payer: Medicare HMO

## 2017-10-04 ENCOUNTER — Ambulatory Visit (HOSPITAL_COMMUNITY): Payer: Medicare HMO

## 2017-10-04 ENCOUNTER — Encounter: Payer: Self-pay | Admitting: Oncology

## 2017-10-04 NOTE — Progress Notes (Signed)
Pt's prescription drug appeal for Lidocaine-Prilocaine cream was approved until 01/02/18.

## 2017-10-05 ENCOUNTER — Other Ambulatory Visit: Payer: Self-pay | Admitting: Student

## 2017-10-05 ENCOUNTER — Encounter (HOSPITAL_COMMUNITY): Payer: Self-pay

## 2017-10-05 NOTE — Pre-Procedure Instructions (Signed)
CBC results 09/20/2017 in epic.

## 2017-10-05 NOTE — Patient Instructions (Addendum)
Your procedure is scheduled on: Friday, October 13, 2017   Surgery Time:  1:00PM-1:45PM   Report to [facility]  Entrance    Report to admitting at 11:00 AM   Call this number if you have problems the morning of surgery [phone]   Do not eat food:After Midnight.   May have liquids until 7:00Am day of surgery   CLEAR LIQUID DIET   Foods Allowed                                                                     Foods Excluded  Coffee and tea, regular and decaf                             liquids that you cannot  Plain Jell-O in any flavor                                             see through such as: Fruit ices (not with fruit pulp)                                     milk, soups, orange juice  Iced Popsicles                                    All solid food Carbonated beverages, regular and diet                                    Cranberry, grape and apple juices Sports drinks like Gatorade Lightly seasoned clear broth or consume(fat free) Sugar, honey syrup  Sample Menu Breakfast                                Lunch                                     Supper Cranberry juice                    Beef broth                            Chicken broth Jell-O                                     Grape juice                           Apple juice Coffee or tea  Jell-O                                      Popsicle                                                Coffee or tea                        Coffee or tea   Do NOT smoke after Midnight   Take these medicines the morning of surgery with A SIP OF WATER: Metoprolol, Pantoprazole, Ativan if needed   DO NOT TAKE ANY DIABETIC MEDICATIONS DAY OF YOUR SURGERY                               You may not have any metal on your body including hair pins, jewelry, and body piercings             Do not wear make-up, lotions, powders, perfumes/cologne, or deodorant             Do not wear nail polish.   Do not shave  48 hours prior to surgery.                Do not bring valuables to the hospital. [place].   Contacts, dentures or bridgework may not be worn into surgery.    Patients discharged the day of surgery will not be allowed to drive home.   Name and phone number of your driver:  Hassan Rowan (sister) [phone]   Special Instructions: Bring a copy of your healthcare power of attorney and living will documents         the day of surgery if you haven't scanned them in before.              Please read over the following fact sheets you were given:  [facility] - Preparing for Surgery Before surgery, you can play an important role.  Because skin is not sterile, your skin needs to be as free of germs as possible.  You can reduce the number of germs on your skin by washing with CHG (chlorahexidine gluconate) soap before surgery.  CHG is an antiseptic cleaner which kills germs and bonds with the skin to continue killing germs even after washing. Please DO NOT use if you have an allergy to CHG or antibacterial soaps.  If your skin becomes reddened/irritated stop using the CHG and inform your nurse when you arrive at Short Stay. Do not shave (including legs and underarms) for at least 48 hours prior to the first CHG shower.  You may shave your face/neck.  Please follow these instructions carefully:  1.  Shower with CHG Soap the night before surgery and the  morning of surgery.  2.  If you choose to wash your hair, wash your hair first as usual with your normal  shampoo.  3.  After you shampoo, rinse your hair and body thoroughly to remove the shampoo.  4.  Use CHG as you would any other liquid soap.  You can apply chg directly to the skin and wash.  Gently with a scrungie or clean washcloth.  5.  Apply the CHG Soap to your body ONLY FROM THE NECK DOWN.   Do   not use on face/ open                           Wound or  open sores. Avoid contact with eyes, ears mouth and   genitals (private parts).                       Wash face,  Genitals (private parts) with your normal soap.             6.  Wash thoroughly, paying special attention to the area where your    surgery  will be performed.  7.  Thoroughly rinse your body with warm water from the neck down.  8.  DO NOT shower/wash with your normal soap after using and rinsing off the CHG Soap.                9.  Pat yourself dry with a clean towel.            10.  Wear clean pajamas.            11.  Place clean sheets on your bed the night of your first shower and do not  sleep with pets. Day of Surgery : Do not apply any lotions/deodorants the morning of surgery.  Please wear clean clothes to the hospital/surgery center.  FAILURE TO FOLLOW THESE INSTRUCTIONS MAY RESULT IN THE CANCELLATION OF YOUR SURGERY  PATIENT SIGNATURE_________________________________  NURSE SIGNATURE__________________________________  ________________________________________________________________________

## 2017-10-06 ENCOUNTER — Telehealth: Payer: Self-pay | Admitting: *Deleted

## 2017-10-06 ENCOUNTER — Ambulatory Visit: Payer: Medicare HMO | Admitting: Oncology

## 2017-10-06 ENCOUNTER — Ambulatory Visit (HOSPITAL_COMMUNITY)
Admission: RE | Admit: 2017-10-06 | Discharge: 2017-10-06 | Disposition: A | Discharge status: Home / Self Care | Payer: Medicare HMO | Source: Ambulatory Visit | Attending: Nurse Practitioner | Admitting: Nurse Practitioner

## 2017-10-06 ENCOUNTER — Other Ambulatory Visit: Payer: Self-pay

## 2017-10-06 ENCOUNTER — Encounter (HOSPITAL_COMMUNITY): Payer: Self-pay

## 2017-10-06 ENCOUNTER — Other Ambulatory Visit: Payer: Self-pay | Admitting: Nurse Practitioner

## 2017-10-06 ENCOUNTER — Ambulatory Visit (HOSPITAL_COMMUNITY)
Admission: RE | Admit: 2017-10-06 | Discharge: 2017-10-06 | Disposition: A | Discharge status: Home / Self Care | Payer: Medicare HMO | Source: Ambulatory Visit | Attending: Anesthesiology | Admitting: Anesthesiology

## 2017-10-06 ENCOUNTER — Ambulatory Visit (HOSPITAL_COMMUNITY)
Admission: RE | Admit: 2017-10-06 | Discharge: 2017-10-06 | Disposition: A | Discharge status: Home / Self Care | Payer: Medicare HMO | Source: Ambulatory Visit | Attending: Oncology | Admitting: Oncology

## 2017-10-06 ENCOUNTER — Encounter (HOSPITAL_COMMUNITY)
Admission: RE | Admit: 2017-10-06 | Discharge: 2017-10-06 | Disposition: A | Discharge status: Home / Self Care | Payer: Medicare HMO | Source: Ambulatory Visit | Attending: Urology | Admitting: Urology

## 2017-10-06 DIAGNOSIS — Z9842 Cataract extraction status, left eye: Secondary | ICD-10-CM | POA: Insufficient documentation

## 2017-10-06 DIAGNOSIS — N1330 Unspecified hydronephrosis: Secondary | ICD-10-CM | POA: Insufficient documentation

## 2017-10-06 DIAGNOSIS — E559 Vitamin D deficiency, unspecified: Secondary | ICD-10-CM | POA: Insufficient documentation

## 2017-10-06 DIAGNOSIS — Z853 Personal history of malignant neoplasm of breast: Secondary | ICD-10-CM | POA: Diagnosis not present

## 2017-10-06 DIAGNOSIS — G4700 Insomnia, unspecified: Secondary | ICD-10-CM | POA: Insufficient documentation

## 2017-10-06 DIAGNOSIS — F418 Other specified anxiety disorders: Secondary | ICD-10-CM | POA: Diagnosis not present

## 2017-10-06 DIAGNOSIS — C786 Secondary malignant neoplasm of retroperitoneum and peritoneum: Secondary | ICD-10-CM | POA: Diagnosis not present

## 2017-10-06 DIAGNOSIS — E785 Hyperlipidemia, unspecified: Secondary | ICD-10-CM | POA: Diagnosis not present

## 2017-10-06 DIAGNOSIS — C7961 Secondary malignant neoplasm of right ovary: Secondary | ICD-10-CM | POA: Diagnosis not present

## 2017-10-06 DIAGNOSIS — K210 Gastro-esophageal reflux disease with esophagitis: Secondary | ICD-10-CM | POA: Diagnosis not present

## 2017-10-06 DIAGNOSIS — I10 Essential (primary) hypertension: Secondary | ICD-10-CM | POA: Diagnosis not present

## 2017-10-06 DIAGNOSIS — C187 Malignant neoplasm of sigmoid colon: Secondary | ICD-10-CM | POA: Diagnosis not present

## 2017-10-06 DIAGNOSIS — R69 Illness, unspecified: Secondary | ICD-10-CM | POA: Diagnosis not present

## 2017-10-06 DIAGNOSIS — H9209 Otalgia, unspecified ear: Secondary | ICD-10-CM | POA: Insufficient documentation

## 2017-10-06 DIAGNOSIS — K760 Fatty (change of) liver, not elsewhere classified: Secondary | ICD-10-CM | POA: Diagnosis not present

## 2017-10-06 DIAGNOSIS — C787 Secondary malignant neoplasm of liver and intrahepatic bile duct: Secondary | ICD-10-CM | POA: Insufficient documentation

## 2017-10-06 DIAGNOSIS — C189 Malignant neoplasm of colon, unspecified: Secondary | ICD-10-CM | POA: Diagnosis present

## 2017-10-06 DIAGNOSIS — I4891 Unspecified atrial fibrillation: Secondary | ICD-10-CM | POA: Insufficient documentation

## 2017-10-06 DIAGNOSIS — R1310 Dysphagia, unspecified: Secondary | ICD-10-CM | POA: Diagnosis not present

## 2017-10-06 DIAGNOSIS — M1990 Unspecified osteoarthritis, unspecified site: Secondary | ICD-10-CM | POA: Insufficient documentation

## 2017-10-06 DIAGNOSIS — Z9841 Cataract extraction status, right eye: Secondary | ICD-10-CM | POA: Diagnosis not present

## 2017-10-06 DIAGNOSIS — I700 Atherosclerosis of aorta: Secondary | ICD-10-CM | POA: Diagnosis not present

## 2017-10-06 DIAGNOSIS — Z888 Allergy status to other drugs, medicaments and biological substances status: Secondary | ICD-10-CM | POA: Insufficient documentation

## 2017-10-06 DIAGNOSIS — Z96643 Presence of artificial hip joint, bilateral: Secondary | ICD-10-CM | POA: Diagnosis not present

## 2017-10-06 DIAGNOSIS — Z881 Allergy status to other antibiotic agents status: Secondary | ICD-10-CM | POA: Insufficient documentation

## 2017-10-06 DIAGNOSIS — R05 Cough: Secondary | ICD-10-CM | POA: Diagnosis not present

## 2017-10-06 DIAGNOSIS — R7303 Prediabetes: Secondary | ICD-10-CM | POA: Diagnosis not present

## 2017-10-06 DIAGNOSIS — M419 Scoliosis, unspecified: Secondary | ICD-10-CM | POA: Insufficient documentation

## 2017-10-06 DIAGNOSIS — R0602 Shortness of breath: Secondary | ICD-10-CM | POA: Diagnosis not present

## 2017-10-06 DIAGNOSIS — Z79899 Other long term (current) drug therapy: Secondary | ICD-10-CM | POA: Insufficient documentation

## 2017-10-06 DIAGNOSIS — Z90710 Acquired absence of both cervix and uterus: Secondary | ICD-10-CM | POA: Insufficient documentation

## 2017-10-06 DIAGNOSIS — Z5111 Encounter for antineoplastic chemotherapy: Secondary | ICD-10-CM | POA: Diagnosis not present

## 2017-10-06 DIAGNOSIS — Z01818 Encounter for other preprocedural examination: Secondary | ICD-10-CM

## 2017-10-06 DIAGNOSIS — Z452 Encounter for adjustment and management of vascular access device: Secondary | ICD-10-CM | POA: Diagnosis not present

## 2017-10-06 DIAGNOSIS — R9431 Abnormal electrocardiogram [ECG] [EKG]: Secondary | ICD-10-CM

## 2017-10-06 DIAGNOSIS — Z7901 Long term (current) use of anticoagulants: Secondary | ICD-10-CM | POA: Insufficient documentation

## 2017-10-06 HISTORY — DX: Scoliosis, unspecified: M41.9

## 2017-10-06 HISTORY — DX: Unspecified cirrhosis of liver: K74.60

## 2017-10-06 HISTORY — DX: Atherosclerosis of aorta: I70.0

## 2017-10-06 HISTORY — PX: IR FLUORO GUIDE PORT INSERTION RIGHT: IMG5741

## 2017-10-06 HISTORY — DX: Fatty (change of) liver, not elsewhere classified: K76.0

## 2017-10-06 HISTORY — DX: Other ascites: R18.8

## 2017-10-06 HISTORY — DX: Prediabetes: R73.03

## 2017-10-06 HISTORY — DX: Unspecified hearing loss, unspecified ear: H91.90

## 2017-10-06 HISTORY — DX: Liver disease, unspecified: K76.9

## 2017-10-06 HISTORY — DX: Pleural effusion, not elsewhere classified: J90

## 2017-10-06 HISTORY — PX: IR US GUIDE VASC ACCESS RIGHT: IMG2390

## 2017-10-06 HISTORY — DX: Unspecified atrial fibrillation: I48.91

## 2017-10-06 HISTORY — DX: Localized edema: R60.0

## 2017-10-06 HISTORY — DX: Unspecified hydronephrosis: N13.30

## 2017-10-06 HISTORY — DX: Malignant neoplasm of colon, unspecified: C18.9

## 2017-10-06 HISTORY — DX: Tinnitus, unspecified ear: H93.19

## 2017-10-06 HISTORY — DX: Intra-abdominal and pelvic swelling, mass and lump, unspecified site: R19.00

## 2017-10-06 LAB — HEMOGLOBIN A1C
HEMOGLOBIN A1C: 5.6 % (ref 4.8–5.6)
Mean Plasma Glucose: 114.02 mg/dL

## 2017-10-06 LAB — BASIC METABOLIC PANEL
Anion gap: 11 (ref 5–15)
Anion gap: 12 (ref 5–15)
BUN: 37 mg/dL — ABNORMAL HIGH (ref 6–20)
BUN: 37 mg/dL — AB (ref 6–20)
CALCIUM: 10.1 mg/dL (ref 8.9–10.3)
CO2: 24 mmol/L (ref 22–32)
CO2: 24 mmol/L (ref 22–32)
CREATININE: 1.54 mg/dL — AB (ref 0.44–1.00)
CREATININE: 1.5 mg/dL — AB (ref 0.44–1.00)
Calcium: 10 mg/dL (ref 8.9–10.3)
Chloride: 101 mmol/L (ref 101–111)
Chloride: 101 mmol/L (ref 101–111)
GFR calc Af Amer: 35 mL/min — ABNORMAL LOW (ref >60)
GFR calc Af Amer: 36 mL/min — ABNORMAL LOW (ref >60)
GFR calc non Af Amer: 30 mL/min — ABNORMAL LOW (ref >60)
GFR calc non Af Amer: 31 mL/min — ABNORMAL LOW (ref >60)
GLUCOSE: 121 mg/dL — AB (ref 65–99)
GLUCOSE: 112 mg/dL — AB (ref 65–99)
POTASSIUM: 5.3 mmol/L — AB (ref 3.5–5.1)
POTASSIUM: 5.2 mmol/L — AB (ref 3.5–5.1)
SODIUM: 136 mmol/L (ref 135–145)
Sodium: 137 mmol/L (ref 135–145)

## 2017-10-06 LAB — CBC WITH DIFFERENTIAL/PLATELET
Basophils Absolute: 0 10*3/uL (ref 0.0–0.1)
Basophils Relative: 0 %
EOS ABS: 0 10*3/uL (ref 0.0–0.7)
EOS PCT: 0 %
HCT: 37.1 % (ref 36.0–46.0)
Hemoglobin: 12.5 g/dL (ref 12.0–15.0)
LYMPHS ABS: 1.6 10*3/uL (ref 0.7–4.0)
LYMPHS PCT: 18 %
MCH: 30.9 pg (ref 26.0–34.0)
MCHC: 33.7 g/dL (ref 30.0–36.0)
MCV: 91.6 fL (ref 78.0–100.0)
MONOS PCT: 5 %
Monocytes Absolute: 0.4 10*3/uL (ref 0.1–1.0)
Neutro Abs: 6.8 10*3/uL (ref 1.7–7.7)
Neutrophils Relative %: 77 %
PLATELETS: 357 10*3/uL (ref 150–400)
RBC: 4.05 MIL/uL (ref 3.87–5.11)
RDW: 15.3 % (ref 11.5–15.5)
WBC: 8.9 10*3/uL (ref 4.0–10.5)

## 2017-10-06 LAB — GLUCOSE, CAPILLARY: Glucose-Capillary: 100 mg/dL — ABNORMAL HIGH (ref 65–99)

## 2017-10-06 LAB — PROTIME-INR
INR: 1.16
Prothrombin Time: 14.7 seconds (ref 11.4–15.2)

## 2017-10-06 MED ORDER — MIDAZOLAM HCL 5 MG/5ML IJ SOLN
INTRAMUSCULAR | Status: AC | PRN
  Administered 2017-10-06: 0.5 mg via INTRAVENOUS

## 2017-10-06 MED ORDER — FENTANYL CITRATE (PF) 100 MCG/2ML IJ SOLN
INTRAMUSCULAR | Status: AC | PRN
  Administered 2017-10-06 (×2): 50 ug via INTRAVENOUS

## 2017-10-06 MED ORDER — CEFAZOLIN SODIUM-DEXTROSE 2-4 GM/100ML-% IV SOLN
2.0000 g | INTRAVENOUS | Status: AC
  Administered 2017-10-06: 2 g via INTRAVENOUS
  Filled 2017-10-06: qty 100

## 2017-10-06 MED ORDER — HEPARIN SOD (PORK) LOCK FLUSH 100 UNIT/ML IV SOLN
INTRAVENOUS | Status: AC
  Filled 2017-10-06: qty 5

## 2017-10-06 MED ORDER — SODIUM CHLORIDE 0.9 % IV SOLN
INTRAVENOUS | Status: DC
  Administered 2017-10-06: 13:00:00 via INTRAVENOUS

## 2017-10-06 MED ORDER — HEPARIN SOD (PORK) LOCK FLUSH 100 UNIT/ML IV SOLN
INTRAVENOUS | Status: AC | PRN
  Administered 2017-10-06: 500 [IU] via INTRAVENOUS

## 2017-10-06 MED ORDER — MIDAZOLAM HCL 2 MG/2ML IJ SOLN
INTRAMUSCULAR | Status: AC
  Filled 2017-10-06: qty 4

## 2017-10-06 MED ORDER — LIDOCAINE HCL 1 % IJ SOLN
INTRAMUSCULAR | Status: AC
  Filled 2017-10-06: qty 20

## 2017-10-06 MED ORDER — MIDAZOLAM HCL 2 MG/2ML IJ SOLN
INTRAMUSCULAR | Status: AC | PRN
  Administered 2017-10-06: 1 mg via INTRAVENOUS

## 2017-10-06 MED ORDER — FENTANYL CITRATE (PF) 100 MCG/2ML IJ SOLN
INTRAMUSCULAR | Status: AC
  Filled 2017-10-06: qty 4

## 2017-10-06 MED ORDER — LIDOCAINE HCL 1 % IJ SOLN
INTRAMUSCULAR | Status: AC | PRN
  Administered 2017-10-06: 10 mL

## 2017-10-06 NOTE — H&P (Signed)
Referring Physician(s): Sherrill,B  Supervising Physician: Marybelle Killings  Patient Status:  [facility] OP  Chief Complaint: "I'm getting a port"   Subjective: Patient familiar to IR service from prior right thoracentesis on 09/15/17.  She has a remote history of left breast cancer and metastatic colon cancer diagnosed in March 2018, status post surgery.  She presents now with progressive disease and is scheduled for Port-A-Cath placement for chemotherapy.  She currently denies fever, headache, chest pain, worsening dyspnea, cough, back pain, nausea, vomiting or bleeding.  She does have fatigue and some abdominal discomfort/bloating. Past Medical History:  Diagnosis Date  . Abdominal mass 09/19/2016  . Anxiety state, unspecified   . Aortic atherosclerosis ([place])   . Ascites   . Atrial fibrillation ([place])   . Carpal tunnel syndrome   . Cirrhosis ([place])   . Colon cancer ([place])    Stage 4  . Degeneration of intervertebral disc, site unspecified   . Depressive disorder, not elsewhere classified   . Dysphagia, unspecified(787.20)   . Edema   . Fatty liver   . Headache(784.0)   . Hearing loss   . Hydronephrosis of right kidney    obstruction  . Insomnia, unspecified   . Internal hemorrhoids   . Leg edema, right   . Liver cyst   . Liver lesion   . Lumbago   . Lumbar scoliosis   . Malignant neoplasm of breast (female), unspecified site   . Nonspecific elevation of levels of transaminase or lactic acid dehydrogenase (LDH)   . Osteoarthrosis, unspecified whether generalized or localized, unspecified site   . Otalgia, unspecified   . Other and unspecified hyperlipidemia   . Other chronic nonalcoholic liver disease   . Other malaise and fatigue   . Other malaise and fatigue   . Other nonspecific abnormal serum enzyme levels   . Pain in joint, lower leg   . Pain in joint, pelvic region and thigh   . Pain in joint, shoulder region   . Palpitations   . Pleural effusion on left 09/2017   Small   . Pleural effusion on right 09/2017  . Pre-diabetes   . Reflux esophagitis   . Tinnitus   . Unspecified essential hypertension   . Unspecified vitamin D deficiency    Past Surgical History:  Procedure Laterality Date  . ABDOMINAL HYSTERECTOMY  1981   with appendix removal, LSO  . APPENDECTOMY     with hysterectomy  . BREAST CYST EXCISION Left 1982  . BREAST LUMPECTOMY Left 1994   lymph cancer   . CATARACT EXTRACTION, BILATERAL  2016   [place]  . CHOLECYSTECTOMY  1987  . COLONOSCOPY WITH ESOPHAGOGASTRODUODENOSCOPY (EGD)  09/07/2016  . DILATION AND CURETTAGE OF UTERUS  1980  . LUMBAR DISC SURGERY  2003   L4-L5  . LUMBAR SPINE SURGERY  2002  . THORACENTESIS  09/15/2017  . TOTAL HIP ARTHROPLASTY Bilateral 2003     Allergies: Daypro [oxaprozin]; Erythromycin; and Talwin [pentazocine]  Medications: Prior to Admission medications   Medication Sig Start Date End Date Taking? Authorizing Provider  amLODipine (NORVASC) 5 MG tablet One daily to help control BP Patient taking differently: Take 5 mg by mouth at bedtime. One daily to help control BP 12/14/16   Estill Dooms, MD  amoxicillin-clavulanate (AUGMENTIN) 875-125 MG tablet Take 1 tablet by mouth 2 (two) times daily. Patient not taking: Reported on 10/02/2017 09/18/17   Lauree Chandler, NP  butalbital-acetaminophen-caffeine (FIORICET) 5593443071 MG tablet Take one or 2  tablets every 6 hours if needed for headache Patient not taking: Reported on 10/02/2017 05/08/17   Lauree Chandler, NP  cholecalciferol (VITAMIN D) 1000 UNITS tablet Take 2,000 Units by mouth every evening.     [provider]  ELIQUIS 5 MG TABS tablet One twice daily for anticoagulation Patient taking differently: Take 5 mg by mouth 2 (two) times daily. One twice daily for anticoagulation 12/14/16   Estill Dooms, MD  HYDROcodone-acetaminophen (NORCO/VICODIN) 5-325 MG tablet Take 1 tablet by mouth every 8 (eight) hours as needed. For severe pain  09/22/17   Lauree Chandler, NP  lidocaine-prilocaine (EMLA) cream Apply to port site 1 hour prior to use. Do not rub in. Cover with plastic. 09/28/17   Ladell Pier, MD  loperamide (IMODIUM A-D) 2 MG tablet Take one tablet by mouth with each loose stool up to 8 tablets in 24 hours 05/10/16   Estill Dooms, MD  LORazepam (ATIVAN) 0.5 MG tablet Take 1 tablet (0.5 mg total) by mouth 2 (two) times daily as needed for anxiety. 10/03/17   Lauree Chandler, NP  metoprolol succinate (TOPROL-XL) 50 MG 24 hr tablet TAKE 1 TABLET BY MOUTH TWICE DAILY FOR BLOOD PRESSURE 12/14/16   Estill Dooms, MD  oxyCODONE-acetaminophen (PERCOCET/ROXICET) 5-325 MG tablet Take 1-2 tablets by mouth every 6 (six) hours as needed for severe pain. 09/27/17   Owens Shark, NP  pantoprazole (PROTONIX) 40 MG tablet Take 1 tablet (40 mg total) by mouth daily. 09/14/17   Lauree Chandler, NP  prochlorperazine (COMPAZINE) 5 MG tablet Take 1 tablet (5 mg total) by mouth every 6 (six) hours as needed for nausea or vomiting. 09/28/17   Ladell Pier, MD     Vital Signs: BP (!) 132/92 (BP Location: Right Arm)   Pulse (!) 105   Temp 98.1 F (36.7 C) (Oral)   Resp 16   SpO2 98%   Physical Exam awake, alert.  Chest with diminished breath sounds right base, left clear.  Heart with slightly tachycardic but regular rhythm.  Abdomen sl distended, positive bowel sounds, mild generalized tenderness to palpation.  Trace right lower extremity edema, no left lower extremity edema.  Imaging: No results found.  Labs:  CBC: Recent Labs    11/30/16 1332 03/28/17 1312 07/19/17 1545 09/20/17 1103  WBC 5.7 6.3 6.7 7.4  HGB 11.3* 12.3 11.3*  --   HCT 34.5* 37.4 34.2* 36.8  PLT 173 184 248 330    COAGS: No results for input(s): INR, APTT in the last 8760 hours.  BMP: Recent Labs    07/19/17 1545 09/11/17 0845 09/20/17 1103 10/06/17 1152  NA 139 140 140 136  K 4.8 4.2 5.0 5.3*  CL 105 103 105 101  CO2 27 25 26 24     GLUCOSE 98 104 128 121*  BUN 18 14 21  37*  CALCIUM 9.8 10.6* 10.4 10.1  CREATININE 1.37* 1.44* 1.55* 1.54*  GFRNONAA 36* 33* 30* 30*  GFRAA 42* 38* 35* 35*    LIVER FUNCTION TESTS: Recent Labs    11/30/16 1332 03/13/17 1000 03/28/17 1312 07/19/17 1545 09/20/17 1103  BILITOT 0.72 0.8 0.97 0.6 0.6  AST 36* 27 29 17 19   ALT 24 25 20 8 7   ALKPHOS 49  --  60  --  57  PROT 6.8 6.5 7.0 6.5 6.8  ALBUMIN 3.6  --  3.8  --  2.9*    Assessment and Plan: Pt with remote history of  left breast cancer and metastatic colon cancer diagnosed in March 2018, status post surgery.  She presents now with progressive disease and is scheduled for Port-A-Cath placement for chemotherapy. Risks and benefits of image guided port-a-catheter placement was discussed with the patient/sister including, but not limited to bleeding, infection, pneumothorax, or fibrin sheath development and need for additional procedures.  All of the patient's questions were answered, patient is agreeable to proceed. Consent signed and in chart.  Labs pending    Electronically Signed: D. Rowe Robert, PA-C 10/06/2017, 1:15 PM   I spent a total of 20 minutes at the the patient's bedside AND on the patient's hospital floor or unit, greater than 50% of which was counseling/coordinating care for Port-A-Cath placement

## 2017-10-06 NOTE — Discharge Instructions (Signed)
Implanted Port Insertion, Care After This sheet gives you information about how to care for yourself after your procedure. Your health care provider may also give you more specific instructions. If you have problems or questions, contact your health care provider. What can I expect after the procedure? After your procedure, it is common to have:  Discomfort at the port insertion site.  Bruising on the skin over the port. This should improve over 3-4 days.  Follow these instructions at home: [facility] care  After your port is placed, you will get a manufacturer's information card. The card has information about your port. Keep this card with you at all times.  Take care of the port as told by your health care provider. Ask your health care provider if you or a family member can get training for taking care of the port at home. A home health care nurse may also take care of the port.  Make sure to remember what type of port you have. Incision care  Follow instructions from your health care provider about how to take care of your port insertion site. Make sure you: ? Wash your hands with soap and water before you change your bandage (dressing). If soap and water are not available, use hand sanitizer. ? Change your dressing as told by your health care provider.  You may remove your dressing tomorrow. ? Leave skin glue in place. These skin closures may need to stay in place for 2 weeks or longer. If adhesive strip edges start to loosen and curl up, you may trim the loose edges. Do not remove adhesive strips completely unless your health care provider tells you to do that.  DO NOT use EMLA cream for 2 weeks after port placement as this cream will remove surgical glue on your incision.  Check your port insertion site every day for signs of infection. Check for: ? More redness, swelling, or pain. ? More fluid or blood. ? Warmth. ? Pus or a bad smell. General instructions  Do not take baths, swim,  or use a hot tub until your health care provider approves.  You may shower tomorrow.  Do not lift anything that is heavier than 10 lb (4.5 kg) for a week, or as told by your health care provider.  Ask your health care provider when it is okay to: ? Return to work or school. ? Resume usual physical activities or sports.  Do not drive for 24 hours if you were given a medicine to help you relax (sedative).  Take over-the-counter and prescription medicines only as told by your health care provider.  Wear a medical alert bracelet in case of an emergency. This will tell any health care providers that you have a port.  Keep all follow-up visits as told by your health care provider. This is important. Contact a health care provider if:  You cannot flush your port with saline as directed, or you cannot draw blood from the port.  You have a fever or chills.  You have more redness, swelling, or pain around your port insertion site.  You have more fluid or blood coming from your port insertion site.  Your port insertion site feels warm to the touch.  You have pus or a bad smell coming from the port insertion site. Get help right away if:  You have chest pain or shortness of breath.  You have bleeding from your port that you cannot control. Summary  Take care of the port as  told by your health care provider.  Change your dressing as told by your health care provider.  Keep all follow-up visits as told by your health care provider. This information is not intended to replace advice given to you by your health care provider. Make sure you discuss any questions you have with your health care provider. Document Released: 04/10/2013 Document Revised: 05/11/2016 Document Reviewed: 05/11/2016 Elsevier Interactive Patient Education  2017 [place].      Moderate Conscious Sedation, Adult, Care After These instructions provide you with information about caring for yourself after your  procedure. Your health care provider may also give you more specific instructions. Your treatment has been planned according to current medical practices, but problems sometimes occur. Call your health care provider if you have any problems or questions after your procedure. What can I expect after the procedure? After your procedure, it is common:  To feel sleepy for several hours.  To feel clumsy and have poor balance for several hours.  To have poor judgment for several hours.  To vomit if you eat too soon.  Follow these instructions at home: For at least 24 hours after the procedure:   Do not: ? Participate in activities where you could fall or become injured. ? Drive. ? Use heavy machinery. ? Drink alcohol. ? Take sleeping pills or medicines that cause drowsiness. ? Make important decisions or sign legal documents. ? Take care of children on your own.  Rest. Eating and drinking  Follow the diet recommended by your health care provider.  If you vomit: ? Drink water, juice, or soup when you can drink without vomiting. ? Make sure you have little or no nausea before eating solid foods. General instructions  Have a responsible adult stay with you until you are awake and alert.  Take over-the-counter and prescription medicines only as told by your health care provider.  If you smoke, do not smoke without supervision.  Keep all follow-up visits as told by your health care provider. This is important. Contact a health care provider if:  You keep feeling nauseous or you keep vomiting.  You feel light-headed.  You develop a rash.  You have a fever. Get help right away if:  You have trouble breathing. This information is not intended to replace advice given to you by your health care provider. Make sure you discuss any questions you have with your health care provider. Document Released: 04/10/2013 Document Revised: 11/23/2015 Document Reviewed: 10/10/2015 Elsevier  Interactive Patient Education  [employer].

## 2017-10-06 NOTE — Pre-Procedure Instructions (Signed)
I received voicemail that [facility] got my message about cardiac clearance, ECHO, and stress test request by Dr. Marcie Bal.

## 2017-10-06 NOTE — Sedation Documentation (Signed)
MD suturing port pocket at this time. Pt resting quietly with eyes closed in NAD

## 2017-10-06 NOTE — Pre-Procedure Instructions (Signed)
Left voicemail with Coni at Dr. Simone Curia office about Tamara Yang needing surgical clearance prior to surgery on Friday, October 13, 2017.

## 2017-10-06 NOTE — Procedures (Signed)
RIJV PAC SVC RA EBL 0 Comp 0 

## 2017-10-06 NOTE — Telephone Encounter (Signed)
Tomeka with [facility] called and stated that patient is Scheduled to see Janett Billow for Pre Op Clearance on 10/10/17 @ 1:30  Stated that they did a EKG there and it showed AFIB Inferior Infarct Age Undetermined and Possible interior infarct age undetermined.   Showed this to the Anesthesiologist and he was concerned she would need some further testing prior to having the surgery. He mentioned he would want to see a Echocardiogram as well as a stress test in order for her to be cleared for her Procedure 10/13/17.   FYI for appointment on 10/10/17

## 2017-10-06 NOTE — Pre-Procedure Instructions (Signed)
EKG reviewed by Dr. Renaldo Reel, Ms. Dupuis will need cardiac clearance prior to procedure.  I scheduled her an appointment with her PCP Sherrie Mustache for October 10, 2017 at 1:30PM.  I sent a message via epic to Joelene Millin in reference to her abnormal EKG.

## 2017-10-07 ENCOUNTER — Other Ambulatory Visit: Payer: Self-pay | Admitting: Oncology

## 2017-10-09 ENCOUNTER — Inpatient Hospital Stay: Payer: Medicare HMO

## 2017-10-09 ENCOUNTER — Encounter: Payer: Self-pay | Admitting: Nurse Practitioner

## 2017-10-09 ENCOUNTER — Other Ambulatory Visit: Payer: Medicare HMO

## 2017-10-09 ENCOUNTER — Telehealth: Payer: Self-pay

## 2017-10-09 ENCOUNTER — Ambulatory Visit: Payer: Medicare HMO

## 2017-10-09 ENCOUNTER — Inpatient Hospital Stay: Payer: Medicare HMO | Attending: Oncology | Admitting: Nurse Practitioner

## 2017-10-09 ENCOUNTER — Ambulatory Visit (HOSPITAL_COMMUNITY)
Admission: RE | Admit: 2017-10-09 | Discharge: 2017-10-09 | Disposition: A | Discharge status: Home / Self Care | Payer: Medicare HMO | Source: Ambulatory Visit | Attending: Nurse Practitioner | Admitting: Nurse Practitioner

## 2017-10-09 VITALS — BP 142/65 | HR 115 | Temp 97.5°F | Resp 18 | Ht 65.0 in | Wt 135.9 lb

## 2017-10-09 DIAGNOSIS — C187 Malignant neoplasm of sigmoid colon: Secondary | ICD-10-CM | POA: Diagnosis not present

## 2017-10-09 DIAGNOSIS — R188 Other ascites: Secondary | ICD-10-CM

## 2017-10-09 DIAGNOSIS — R180 Malignant ascites: Secondary | ICD-10-CM | POA: Diagnosis not present

## 2017-10-09 DIAGNOSIS — C482 Malignant neoplasm of peritoneum, unspecified: Secondary | ICD-10-CM | POA: Diagnosis not present

## 2017-10-09 DIAGNOSIS — K56699 Other intestinal obstruction unspecified as to partial versus complete obstruction: Secondary | ICD-10-CM | POA: Diagnosis not present

## 2017-10-09 DIAGNOSIS — K56609 Unspecified intestinal obstruction, unspecified as to partial versus complete obstruction: Secondary | ICD-10-CM | POA: Diagnosis not present

## 2017-10-09 DIAGNOSIS — C189 Malignant neoplasm of colon, unspecified: Secondary | ICD-10-CM

## 2017-10-09 DIAGNOSIS — C786 Secondary malignant neoplasm of retroperitoneum and peritoneum: Secondary | ICD-10-CM | POA: Diagnosis not present

## 2017-10-09 DIAGNOSIS — C787 Secondary malignant neoplasm of liver and intrahepatic bile duct: Secondary | ICD-10-CM | POA: Diagnosis not present

## 2017-10-09 DIAGNOSIS — R110 Nausea: Secondary | ICD-10-CM | POA: Diagnosis not present

## 2017-10-09 DIAGNOSIS — I10 Essential (primary) hypertension: Secondary | ICD-10-CM | POA: Diagnosis not present

## 2017-10-09 DIAGNOSIS — C800 Disseminated malignant neoplasm, unspecified: Secondary | ICD-10-CM | POA: Diagnosis not present

## 2017-10-09 DIAGNOSIS — J90 Pleural effusion, not elsewhere classified: Secondary | ICD-10-CM | POA: Insufficient documentation

## 2017-10-09 DIAGNOSIS — R109 Unspecified abdominal pain: Secondary | ICD-10-CM | POA: Diagnosis not present

## 2017-10-09 DIAGNOSIS — K5900 Constipation, unspecified: Secondary | ICD-10-CM | POA: Diagnosis not present

## 2017-10-09 DIAGNOSIS — R1031 Right lower quadrant pain: Secondary | ICD-10-CM | POA: Diagnosis not present

## 2017-10-09 DIAGNOSIS — R0602 Shortness of breath: Secondary | ICD-10-CM | POA: Diagnosis not present

## 2017-10-09 LAB — CMP (CANCER CENTER ONLY)
ALK PHOS: 59 U/L (ref 40–150)
ANION GAP: 9 (ref 3–11)
AST: 21 U/L (ref 5–34)
Albumin: 2.6 g/dL — ABNORMAL LOW (ref 3.5–5.0)
BUN: 27 mg/dL — ABNORMAL HIGH (ref 7–26)
CALCIUM: 9.9 mg/dL (ref 8.4–10.4)
CO2: 23 mmol/L (ref 22–29)
CREATININE: 1.3 mg/dL — AB (ref 0.60–1.10)
Chloride: 105 mmol/L (ref 98–109)
GFR, EST AFRICAN AMERICAN: 43 mL/min — AB (ref >60)
GFR, EST NON AFRICAN AMERICAN: 37 mL/min — AB (ref >60)
Glucose, Bld: 111 mg/dL (ref 70–140)
Potassium: 4.3 mmol/L (ref 3.5–5.1)
Sodium: 137 mmol/L (ref 136–145)
Total Bilirubin: 0.6 mg/dL (ref 0.2–1.2)
Total Protein: 6.2 g/dL — ABNORMAL LOW (ref 6.4–8.3)

## 2017-10-09 LAB — CEA (IN HOUSE-CHCC): CEA (CHCC-In House): 1693.06 ng/mL — ABNORMAL HIGH (ref 0.00–5.00)

## 2017-10-09 LAB — CBC WITH DIFFERENTIAL (CANCER CENTER ONLY)
Basophils Absolute: 0 10*3/uL (ref 0.0–0.1)
Basophils Relative: 0 %
EOS ABS: 0 10*3/uL (ref 0.0–0.5)
EOS PCT: 0 %
HCT: 35.3 % (ref 34.8–46.6)
HEMOGLOBIN: 11.7 g/dL (ref 11.6–15.9)
LYMPHS ABS: 1 10*3/uL (ref 0.9–3.3)
LYMPHS PCT: 12 %
MCH: 30.4 pg (ref 25.1–34.0)
MCHC: 33.2 g/dL (ref 31.5–36.0)
MCV: 91.7 fL (ref 79.5–101.0)
MONOS PCT: 5 %
Monocytes Absolute: 0.4 10*3/uL (ref 0.1–0.9)
Neutro Abs: 6.7 10*3/uL — ABNORMAL HIGH (ref 1.5–6.5)
Neutrophils Relative %: 83 %
PLATELETS: 271 10*3/uL (ref 145–400)
RBC: 3.85 MIL/uL (ref 3.70–5.45)
RDW: 15.6 % — ABNORMAL HIGH (ref 11.2–14.5)
WBC Count: 8.2 10*3/uL (ref 3.9–10.3)

## 2017-10-09 MED ORDER — LIDOCAINE HCL 1 % IJ SOLN
INTRAMUSCULAR | Status: AC
  Filled 2017-10-09: qty 20

## 2017-10-09 MED ORDER — HEPARIN SOD (PORK) LOCK FLUSH 100 UNIT/ML IV SOLN
500.0000 [IU] | Freq: Once | INTRAVENOUS | Status: DC | PRN
  Filled 2017-10-09: qty 5

## 2017-10-09 MED ORDER — SODIUM CHLORIDE 0.9% FLUSH
10.0000 mL | INTRAVENOUS | Status: DC | PRN
  Administered 2017-10-09: 10 mL
  Filled 2017-10-09: qty 10

## 2017-10-09 NOTE — Progress Notes (Addendum)
[place] OFFICE PROGRESS NOTE   Diagnosis: Colon cancer  INTERVAL HISTORY:   Tamara Yang returns as scheduled.  She reports worsening upper abdominal pain.  She had a small "loose" bowel movement this morning.  Prior to that last bowel movement was 3 days ago.  She denies nausea/vomiting.  She notes increased abdominal pain after eating.  She has early satiety.  Oral intake is poor.  She is more short of breath.  The shortness of breath dramatically worsens with exertion.  She also notes that her heart rate increases.  She denies chest pain.  No leg swelling or calf pain.  No fever or cough.  Objective:  Vital signs in last 24 hours:  Blood pressure (!) 142/65, pulse (!) 115, temperature (!) 97.5 F (36.4 C), temperature source Oral, resp. rate 18, height '5\' 5"'$  (1.651 m), weight 135 lb 14.4 oz (61.6 kg), SpO2 95 %.    HEENT: Mouth appears dry.  No thrush or ulcers. Resp: Diminished breath sounds right lower lung field.  No respiratory distress. Cardio: Regular, tachycardic. GI: Abdomen is distended.  Hypoactive bowel sounds. Vascular: No leg edema.  Skin: Decreased skin turgor. Port-A-Cath site is bandaged.   Lab Results:  Lab Results  Component Value Date   WBC 8.2 10/09/2017   HGB 12.5 10/06/2017   HCT 35.3 10/09/2017   MCV 91.7 10/09/2017   PLT 271 10/09/2017   NEUTROABS 6.7 (H) 10/09/2017    Imaging:  Dg Chest 2 View  Result Date: 10/09/2017 CLINICAL DATA:  Follow-up pleural effusion, shortness of breath. EXAM: CHEST - 2 VIEW COMPARISON:  10/06/2017. FINDINGS: The heart size and mediastinal contours are within normal limits. Small BILATERAL pleural effusions with basilar subsegmental atelectasis appear unchanged from radiograph of 10/06/2017. No osseous findings other than osteopenia. Port-A-Cath good position. IMPRESSION: Stable chest. Electronically Signed   By: Staci Righter M.D.   On: 10/09/2017 14:21   Dg Abd 2 Views  Result Date:  10/09/2017 CLINICAL DATA:  84 year old female with metastatic colon cancer. History of breast cancer. Cramping and abdominal pain with constipation. Initial encounter. EXAM: ABDOMEN - 2 VIEW COMPARISON:  Chest x-ray same date dictated separately. 09/12/2017 CT abdomen and pelvis. FINDINGS: Gas-filled prominent size transverse colon measuring up to 6.7 cm. Stool within ascending colon, descending colon and rectum. Gas-filled small bowel loops central lower abdomen/pelvis may represent sigmoid colon rather than gas distended small bowel loops. Ascites. Bilateral pleural effusions. No free intraperitoneal air (on accompanying chest x-ray). Scoliosis lumbar spine convex right. Bilateral hip replacements. Scattered surgical clips and staples. IMPRESSION: Nonspecific bowel gas pattern without plain film evidence of bowel obstruction. Ascites and bilateral pleural effusions. Electronically Signed   By: Genia Del M.D.   On: 10/09/2017 14:12    Medications: I have reviewed the patient's current medications.  Assessment/Plan: 1. Metastatic colon cancer ? Sigmoid colectomy, right oophorectomy, omentectomy, and mesenteric implant biopsies at [facility] 09/30/2016 ? Sigmoid colon cancer (T4a,N1b,M1c), MSI-stable, mismatch repair protein expression intact ? Metastatic colon cancer involving the omentum, right ovary, mesenteric nodule biopsies\ ? CT chest/abdomen/pelvis 11/30/2016-no evidence of metastatic disease in the chest, abdomen, pelvis. 5 mm lymph node right of the IVC. Postsurgical fluid collection in the presacral space immediately adjacent to the rectal anastomosis with an air-fluid level. ? Foundation 1-no RASmutation; ERBB2 amplification; microsatellite stable; low tumor mutationalburden ? CTs 03/28/2017-new right pleural irregularity/nodularity, no other evidence of metastatic disease ? CTs 09/12/2017-progressive metastatic disease involving multiple liver lesions, peritoneal tumor, ascites, omental caking  in the left upper quadrant, large right pleural effusion, right hydronephrosis ? Right thoracentesis 09/15/2017- metastatic adenocarcinoma consistent with a colon primary   2. Left breast cancer 1994  3. Hypertension  4. Arthritis  5.Renal insufficiency-CT 09/12/2017 with right hydronephrosis  6.   Port-A-Cath placement 10/06/2017     Disposition: Tamara Yang has metastatic colon cancer.  She is scheduled to begin FOLFOX chemotherapy today.  She presents with increased abdominal pain, distention, constipation.  Abdominal x-ray does not show evidence of a bowel obstruction but does show ascites.  We placed the chemotherapy on hold and will reschedule for 1 week.  We are referring her for an ultrasound paracentesis, diagnostic/therapeutic.  She will begin a liquid diet and daily MiraLAX.  She also has complaints of shortness of breath.  Chest x-ray shows small bilateral pleural effusions not significantly changed as compared to 10/06/2017.  The shortness of breath may be related to ascites.  She will return for a follow-up visit on 10/11/2017.  She will contact the office in the interim with any problems.  Patient seen with Dr. Benay Spice.  45 minutes were spent face-to-face at today's visit with the majority of that time involved in counseling/coordination of care.    Ned Card ANP/GNP-BC   10/09/2017  2:54 PM  This was a shared visit with Ned Card.  Tamara Yang was interviewed and examined.  She presents today with increased dyspnea and abdominal pain.  A plain x-ray evaluation does not reveal a bowel obstruction.  Her symptoms may be related to progressive ascites.  She may have early obstructive symptoms from carcinomatosis. She will begin a liquid diet.  We will refer her for a therapeutic paracentesis.  She will return for an office visit 10/11/2017.  Julieanne Manson, MD

## 2017-10-09 NOTE — Telephone Encounter (Signed)
Returned call to pt sister. Sister states "she is hurting really bad and her stomach is bloated at the top. She won't be able to make today's appt".  This RN voiced understanding and spoke with pt directly. Pt reports "that she is hurting bad and unable to stand". Pt confirmed bloating and states she is unable to make today's appts. Pt states "its ok if I can't get my treatment today, i'm not really feeling up to it". This RN spoke with MD and NP and informed pt to try to come on in and we can see her for evaluation. Pt voiced understanding and will call for transportation.

## 2017-10-09 NOTE — Pre-Procedure Instructions (Signed)
CXR results 10/06/2017 faxed to Dr. Karsten Ro via epic.

## 2017-10-09 NOTE — Telephone Encounter (Signed)
Printed avs and calender of upcoming appointment. Per 4/8 los Will call patient with final appointment on 4/16  4H infusion.

## 2017-10-10 ENCOUNTER — Ambulatory Visit: Payer: Self-pay | Admitting: Nurse Practitioner

## 2017-10-10 ENCOUNTER — Encounter: Payer: Self-pay | Admitting: Nurse Practitioner

## 2017-10-10 ENCOUNTER — Ambulatory Visit (INDEPENDENT_AMBULATORY_CARE_PROVIDER_SITE_OTHER): Payer: Medicare HMO | Admitting: Nurse Practitioner

## 2017-10-10 VITALS — BP 138/82 | HR 70 | Temp 98.6°F | Ht 65.0 in | Wt 127.0 lb

## 2017-10-10 DIAGNOSIS — K219 Gastro-esophageal reflux disease without esophagitis: Secondary | ICD-10-CM | POA: Diagnosis not present

## 2017-10-10 DIAGNOSIS — R9431 Abnormal electrocardiogram [ECG] [EKG]: Secondary | ICD-10-CM | POA: Diagnosis not present

## 2017-10-10 DIAGNOSIS — R5381 Other malaise: Secondary | ICD-10-CM

## 2017-10-10 DIAGNOSIS — C189 Malignant neoplasm of colon, unspecified: Secondary | ICD-10-CM | POA: Diagnosis not present

## 2017-10-10 NOTE — Patient Instructions (Addendum)
Continue liquid diet until seen by oncologist tomorrow then hopefully you can advanced to bland diet as tolerated  Cardiology referral placed for further evaluation of heart prior to procedure

## 2017-10-10 NOTE — Progress Notes (Signed)
Careteam: Patient Care Team: Lauree Chandler, NP as PCP - General (Geriatric Medicine) Yehuda Mao, MD as Attending Physician (Rheumatology) Frederik Pear, MD as Consulting Physician (Orthopedic Surgery) Crista Luria, MD as Consulting Physician (Dermatology) Gery Pray, MD as Consulting Physician (Radiation Oncology) Mosie Epstein, CRNA (Inactive) as Referring Physician (Anesthesiology) Clent Jacks, MD as Consulting Physician (Ophthalmology)  Advanced Directive information    Allergies  Allergen Reactions  . Daypro [Oxaprozin]   . Erythromycin   . Talwin [Pentazocine]     Chief Complaint  Patient presents with  . Acute Visit    Pt is being seen for surgical clearance for kidney stent on 10/13/17.   . Other    Sister in room      HPI: Patient is a 84 y.o. female seen in the office today for pre-op visit.  Plan is for her to have cytoscopy with retrograde pyelogram/ureteral stent placement under general anesthesia. She went in for pre-op blood work and EKG  EKG was abnormal and Dr. Albertha Ghee wanted cardiac clearance due to anterior and inferior abnormalities. Currently pt without chest pains or palpitations.  Pt with progressive weakness and fatigue.  Has paracentesis yesterday- sister reports they drew off 4.1 liters from her abdomen. Pt was having increase abdominal pain, tightness and shortness of breath. She still has some soreness but overall feels better.  Breathing has improved since yesterday however still short of breath with activity.   Pain medications has been changed by oncologist due to progression of pain. Taking oxy-apap 5-325 mg 1-2 tablets every 6 hours PRN which is effective in controlling pain.  Has been having constipation- started miralax yesterday  Protonix has been helping her heartburn Was placed on liquid diet but does not really understand why. Reviewed Ned Card and Dr Gearldine Shown notes and educated pt that they were concerned  over obstructive symptoms from carcinomatosis. Currently symptoms have improved. No nausea or vomiting. Small bowel movement; Does not really eat much. Chews but does not want to swallow because she is afraid is is going to hurt her stomach. Does not have a swallowing issue just does not want food to go down to the stomach. However, since she has started with protonix the stomach is feeling better.   Took miralax last night plans to take additional dose today.   Request shower chair and handicap placard due to progressive weakness.   Review of Systems:  Review of Systems  Constitutional: Positive for malaise/fatigue and weight loss. Negative for chills and fever.  Respiratory: Positive for shortness of breath (improved since yesterday). Negative for cough and sputum production.   Cardiovascular: Negative for chest pain, palpitations and leg swelling.  Gastrointestinal: Positive for abdominal pain (better) and constipation. Negative for diarrhea and heartburn.       Pain after swallowing- when food gets to stomach- has improved on protonix  Genitourinary: Positive for frequency. Negative for dysuria and urgency.  Musculoskeletal: Positive for back pain, joint pain and myalgias. Negative for falls.  Skin: Negative.   Neurological: Negative for dizziness and headaches.  Psychiatric/Behavioral: Negative for depression and memory loss. The patient is nervous/anxious. The patient does not have insomnia.     Past Medical History:  Diagnosis Date  . Abdominal mass 09/19/2016  . Anxiety state, unspecified   . Aortic atherosclerosis ([place])   . Ascites   . Atrial fibrillation ([place])   . Carpal tunnel syndrome   . Cirrhosis ([place])   . Colon cancer ([place])  Stage 4  . Degeneration of intervertebral disc, site unspecified   . Depressive disorder, not elsewhere classified   . Dysphagia, unspecified(787.20)   . Edema   . Fatty liver   . Headache(784.0)   . Hearing loss   . Hydronephrosis of right  kidney    obstruction  . Insomnia, unspecified   . Internal hemorrhoids   . Leg edema, right   . Liver cyst   . Liver lesion   . Lumbago   . Lumbar scoliosis   . Malignant neoplasm of breast (female), unspecified site   . Nonspecific elevation of levels of transaminase or lactic acid dehydrogenase (LDH)   . Osteoarthrosis, unspecified whether generalized or localized, unspecified site   . Otalgia, unspecified   . Other and unspecified hyperlipidemia   . Other chronic nonalcoholic liver disease   . Other malaise and fatigue   . Other malaise and fatigue   . Other nonspecific abnormal serum enzyme levels   . Pain in joint, lower leg   . Pain in joint, pelvic region and thigh   . Pain in joint, shoulder region   . Palpitations   . Pleural effusion on left 09/2017   Small   . Pleural effusion on right 09/2017  . Pre-diabetes   . Reflux esophagitis   . Tinnitus   . Unspecified essential hypertension   . Unspecified vitamin D deficiency    Past Surgical History:  Procedure Laterality Date  . ABDOMINAL HYSTERECTOMY  1981   with appendix removal, LSO  . APPENDECTOMY     with hysterectomy  . BREAST CYST EXCISION Left 1982  . BREAST LUMPECTOMY Left 1994   lymph cancer   . CATARACT EXTRACTION, BILATERAL  2016   [place]  . CHOLECYSTECTOMY  1987  . COLONOSCOPY WITH ESOPHAGOGASTRODUODENOSCOPY (EGD)  09/07/2016  . DILATION AND CURETTAGE OF UTERUS  1980  . IR FLUORO GUIDE PORT INSERTION RIGHT  10/06/2017  . IR US GUIDE VASC ACCESS RIGHT  10/06/2017  . LUMBAR DISC SURGERY  2003   L4-L5  . LUMBAR SPINE SURGERY  2002  . THORACENTESIS  09/15/2017  . TOTAL HIP ARTHROPLASTY Bilateral 2003   Social History:   reports that she has never smoked. She has never used smokeless tobacco. She reports that she does not drink alcohol or use drugs.  Family History  Problem Relation Age of Onset  . Heart disease Mother   . Arthritis Mother   . Prostate cancer Father   . Diabetes Brother   .  Prostate cancer Brother     Medications: Patient's Medications  New Prescriptions   No medications on file  Previous Medications   AMLODIPINE (NORVASC) 5 MG TABLET    One daily to help control BP   BUTALBITAL-ACETAMINOPHEN-CAFFEINE (FIORICET) 50-325-40 MG TABLET    Take one or 2 tablets every 6 hours if needed for headache   CHOLECALCIFEROL (VITAMIN D) 1000 UNITS TABLET    Take 2,000 Units by mouth every evening.    ELIQUIS 5 MG TABS TABLET    One twice daily for anticoagulation   HYDROCODONE-ACETAMINOPHEN (NORCO/VICODIN) 5-325 MG TABLET    Take 1 tablet by mouth every 8 (eight) hours as needed. For severe pain   LIDOCAINE-PRILOCAINE (EMLA) CREAM    Apply to port site 1 hour prior to use. Do not rub in. Cover with plastic.   LOPERAMIDE (IMODIUM A-D) 2 MG TABLET    Take one tablet by mouth with each loose stool up to 8 tablets in 24  hours   LORAZEPAM (ATIVAN) 0.5 MG TABLET    Take 1 tablet (0.5 mg total) by mouth 2 (two) times daily as needed for anxiety.   METOPROLOL SUCCINATE (TOPROL-XL) 50 MG 24 HR TABLET    TAKE 1 TABLET BY MOUTH TWICE DAILY FOR BLOOD PRESSURE   OXYCODONE-ACETAMINOPHEN (PERCOCET/ROXICET) 5-325 MG TABLET    Take 1-2 tablets by mouth every 6 (six) hours as needed for severe pain.   PANTOPRAZOLE (PROTONIX) 40 MG TABLET    Take 1 tablet (40 mg total) by mouth daily.   PROCHLORPERAZINE (COMPAZINE) 5 MG TABLET    Take 1 tablet (5 mg total) by mouth every 6 (six) hours as needed for nausea or vomiting.  Modified Medications   No medications on file  Discontinued Medications   AMOXICILLIN-CLAVULANATE (AUGMENTIN) 875-125 MG TABLET    Take 1 tablet by mouth 2 (two) times daily.     Physical Exam:  Vitals:   10/10/17 1328  BP: 138/82  Pulse: 70  Temp: 98.6 F (37 C)  TempSrc: Oral  SpO2: 98%  Weight: 127 lb (57.6 kg)  Height: 5\' 5"  (1.651 m)   Body mass index is 21.13 kg/m.  Physical Exam  Constitutional: She is oriented to person, place, and time. She appears  well-developed and well-nourished. No distress.  HENT:  Head: Normocephalic and atraumatic.  Right Ear: External ear normal.  Left Ear: External ear normal.  Nose: Nose normal.  Mouth/Throat: Oropharynx is clear and moist. No oropharyngeal exudate.  Eyes: Pupils are equal, round, and reactive to light. Conjunctivae are normal.  Neck: Normal range of motion. Neck supple.  Cardiovascular: Normal rate, regular rhythm and normal heart sounds.  Pulmonary/Chest: Effort normal.  Short of breath with activity    Abdominal: Soft. Bowel sounds are normal. There is tenderness in the epigastric area and left upper quadrant.  Musculoskeletal: She exhibits no edema or tenderness.  Neurological: She is alert and oriented to person, place, and time.  Skin: Skin is warm and dry. She is not diaphoretic.  Psychiatric: She has a normal mood and affect.    Labs reviewed: Basic Metabolic Panel: Recent Labs    10/06/17 1152 10/06/17 1307 10/09/17 1248  NA 136 137 137  K 5.3* 5.2* 4.3  CL 101 101 105  CO2 24 24 23   GLUCOSE 121* 112* 111  BUN 37* 37* 27*  CREATININE 1.54* 1.50* 1.30*  CALCIUM 10.1 10.0 9.9   Liver Function Tests: Recent Labs    03/28/17 1312 07/19/17 1545 09/20/17 1103 10/09/17 1248  AST 29 17 19 21   ALT 20 8 7  <6  ALKPHOS 60  --  57 59  BILITOT 0.97 0.6 0.6 0.6  PROT 7.0 6.5 6.8 6.2*  ALBUMIN 3.8  --  2.9* 2.6*   No results for input(s): LIPASE, AMYLASE in the last 8760 hours. No results for input(s): AMMONIA in the last 8760 hours. CBC: Recent Labs    03/28/17 1312  07/19/17 1545 09/20/17 1103 10/06/17 1307 10/09/17 1248  WBC 6.3  --  6.7 7.4 8.9 8.2  NEUTROABS 3.3   < > 3,832 5.6 6.8 6.7*  HGB 12.3  --  11.3*  --  12.5  --   HCT 37.4   < > 34.2* 36.8 37.1 35.3  MCV 93.4   < > 91.9 91.8 91.6 91.7  PLT 184  --  248 330 357 271   < > = values in this interval not displayed.   Lipid Panel: Recent Labs  03/13/17 1000  CHOL 158  HDL 62  LDLCALC 79    TRIG 87  CHOLHDL 2.5   TSH: No results for input(s): TSH in the last 8760 hours. A1C: Lab Results  Component Value Date   HGBA1C 5.6 10/06/2017     Assessment/Plan 1. Gastroesophageal reflux disease without esophagitis -to continue protonix as prescribed as this has been helping with symptoms   2. Abnormal EKG Noted on pre-op work-up, anesthesia request additional testing, cardiology referral has been placed  3. Malignant neoplasm of colon, unspecified part of colon ([place]) -following closely with oncology for therapeutic treatment.   - Amb Referral to Palliative Care  4. Debility Pt significantly weaker today with increased shortness of breath. Pt with metastatic colon cancer and recent therapeutic paracentesis with 4 L removed per sister. Abdominal pain has improved however some tenderness throughout. Started miralax yesterday and on liquid diet per oncology with concerns for bowel obstruction. Has follow up tomorrow.  Pt with right hydronephrosis needing stent, medically she is very frail due to worsening debility with shortness of breath now with changes on EKG and will need cardiac clearance. She is medically frail but can proceed knowing risk involved.    Next appt: 12/18/2017 Carlos American. [place], [facility] [phone]

## 2017-10-11 ENCOUNTER — Telehealth: Payer: Self-pay | Admitting: Nurse Practitioner

## 2017-10-11 ENCOUNTER — Emergency Department (HOSPITAL_COMMUNITY): Payer: Medicare HMO

## 2017-10-11 ENCOUNTER — Ambulatory Visit: Payer: Medicare HMO | Admitting: Nurse Practitioner

## 2017-10-11 ENCOUNTER — Telehealth: Payer: Self-pay | Admitting: Cardiology

## 2017-10-11 ENCOUNTER — Inpatient Hospital Stay (HOSPITAL_COMMUNITY)
Admission: EM | Admit: 2017-10-11 | Discharge: 2017-10-14 | DRG: 844 | Disposition: A | Discharge status: Home / Self Care | Payer: Medicare HMO | Attending: Internal Medicine | Admitting: Internal Medicine

## 2017-10-11 ENCOUNTER — Telehealth: Payer: Self-pay

## 2017-10-11 ENCOUNTER — Encounter (HOSPITAL_COMMUNITY): Payer: Self-pay | Admitting: *Deleted

## 2017-10-11 ENCOUNTER — Other Ambulatory Visit: Payer: Self-pay

## 2017-10-11 DIAGNOSIS — K5900 Constipation, unspecified: Secondary | ICD-10-CM | POA: Diagnosis not present

## 2017-10-11 DIAGNOSIS — Z853 Personal history of malignant neoplasm of breast: Secondary | ICD-10-CM

## 2017-10-11 DIAGNOSIS — Z8249 Family history of ischemic heart disease and other diseases of the circulatory system: Secondary | ICD-10-CM

## 2017-10-11 DIAGNOSIS — Z96643 Presence of artificial hip joint, bilateral: Secondary | ICD-10-CM | POA: Diagnosis present

## 2017-10-11 DIAGNOSIS — K56609 Unspecified intestinal obstruction, unspecified as to partial versus complete obstruction: Secondary | ICD-10-CM

## 2017-10-11 DIAGNOSIS — F411 Generalized anxiety disorder: Secondary | ICD-10-CM | POA: Diagnosis present

## 2017-10-11 DIAGNOSIS — K56699 Other intestinal obstruction unspecified as to partial versus complete obstruction: Secondary | ICD-10-CM | POA: Diagnosis not present

## 2017-10-11 DIAGNOSIS — R404 Transient alteration of awareness: Secondary | ICD-10-CM | POA: Diagnosis not present

## 2017-10-11 DIAGNOSIS — R188 Other ascites: Secondary | ICD-10-CM | POA: Diagnosis present

## 2017-10-11 DIAGNOSIS — N289 Disorder of kidney and ureter, unspecified: Secondary | ICD-10-CM | POA: Diagnosis not present

## 2017-10-11 DIAGNOSIS — Z90710 Acquired absence of both cervix and uterus: Secondary | ICD-10-CM

## 2017-10-11 DIAGNOSIS — I4891 Unspecified atrial fibrillation: Secondary | ICD-10-CM | POA: Diagnosis present

## 2017-10-11 DIAGNOSIS — I10 Essential (primary) hypertension: Secondary | ICD-10-CM | POA: Diagnosis present

## 2017-10-11 DIAGNOSIS — M1990 Unspecified osteoarthritis, unspecified site: Secondary | ICD-10-CM | POA: Diagnosis present

## 2017-10-11 DIAGNOSIS — C786 Secondary malignant neoplasm of retroperitoneum and peritoneum: Secondary | ICD-10-CM | POA: Diagnosis not present

## 2017-10-11 DIAGNOSIS — R531 Weakness: Secondary | ICD-10-CM | POA: Diagnosis not present

## 2017-10-11 DIAGNOSIS — Z90721 Acquired absence of ovaries, unilateral: Secondary | ICD-10-CM

## 2017-10-11 DIAGNOSIS — C787 Secondary malignant neoplasm of liver and intrahepatic bile duct: Secondary | ICD-10-CM | POA: Diagnosis not present

## 2017-10-11 DIAGNOSIS — C778 Secondary and unspecified malignant neoplasm of lymph nodes of multiple regions: Secondary | ICD-10-CM | POA: Diagnosis not present

## 2017-10-11 DIAGNOSIS — F329 Major depressive disorder, single episode, unspecified: Secondary | ICD-10-CM | POA: Diagnosis present

## 2017-10-11 DIAGNOSIS — H9190 Unspecified hearing loss, unspecified ear: Secondary | ICD-10-CM | POA: Diagnosis present

## 2017-10-11 DIAGNOSIS — Z978 Presence of other specified devices: Secondary | ICD-10-CM | POA: Diagnosis not present

## 2017-10-11 DIAGNOSIS — K56600 Partial intestinal obstruction, unspecified as to cause: Secondary | ICD-10-CM | POA: Diagnosis not present

## 2017-10-11 DIAGNOSIS — C187 Malignant neoplasm of sigmoid colon: Secondary | ICD-10-CM | POA: Diagnosis not present

## 2017-10-11 DIAGNOSIS — Z7901 Long term (current) use of anticoagulants: Secondary | ICD-10-CM

## 2017-10-11 DIAGNOSIS — N1330 Unspecified hydronephrosis: Secondary | ICD-10-CM | POA: Diagnosis present

## 2017-10-11 DIAGNOSIS — R109 Unspecified abdominal pain: Secondary | ICD-10-CM | POA: Diagnosis not present

## 2017-10-11 DIAGNOSIS — C189 Malignant neoplasm of colon, unspecified: Secondary | ICD-10-CM | POA: Diagnosis not present

## 2017-10-11 DIAGNOSIS — R110 Nausea: Secondary | ICD-10-CM | POA: Diagnosis not present

## 2017-10-11 DIAGNOSIS — Z9841 Cataract extraction status, right eye: Secondary | ICD-10-CM

## 2017-10-11 DIAGNOSIS — C7961 Secondary malignant neoplasm of right ovary: Secondary | ICD-10-CM | POA: Diagnosis not present

## 2017-10-11 DIAGNOSIS — Z79899 Other long term (current) drug therapy: Secondary | ICD-10-CM

## 2017-10-11 DIAGNOSIS — C800 Disseminated malignant neoplasm, unspecified: Principal | ICD-10-CM | POA: Diagnosis present

## 2017-10-11 DIAGNOSIS — Z9049 Acquired absence of other specified parts of digestive tract: Secondary | ICD-10-CM

## 2017-10-11 DIAGNOSIS — Z66 Do not resuscitate: Secondary | ICD-10-CM | POA: Diagnosis present

## 2017-10-11 DIAGNOSIS — R1031 Right lower quadrant pain: Secondary | ICD-10-CM | POA: Diagnosis not present

## 2017-10-11 DIAGNOSIS — Z881 Allergy status to other antibiotic agents status: Secondary | ICD-10-CM

## 2017-10-11 DIAGNOSIS — Z9842 Cataract extraction status, left eye: Secondary | ICD-10-CM

## 2017-10-11 DIAGNOSIS — Z888 Allergy status to other drugs, medicaments and biological substances status: Secondary | ICD-10-CM

## 2017-10-11 LAB — COMPREHENSIVE METABOLIC PANEL
ALT: 9 U/L — ABNORMAL LOW (ref 14–54)
ANION GAP: 10 (ref 5–15)
AST: 23 U/L (ref 15–41)
Albumin: 2.5 g/dL — ABNORMAL LOW (ref 3.5–5.0)
Alkaline Phosphatase: 52 U/L (ref 38–126)
BILIRUBIN TOTAL: 0.9 mg/dL (ref 0.3–1.2)
BUN: 30 mg/dL — AB (ref 6–20)
CHLORIDE: 102 mmol/L (ref 101–111)
CO2: 23 mmol/L (ref 22–32)
Calcium: 9.3 mg/dL (ref 8.9–10.3)
Creatinine, Ser: 1.22 mg/dL — ABNORMAL HIGH (ref 0.44–1.00)
GFR calc Af Amer: 46 mL/min — ABNORMAL LOW (ref >60)
GFR calc non Af Amer: 40 mL/min — ABNORMAL LOW (ref >60)
Glucose, Bld: 123 mg/dL — ABNORMAL HIGH (ref 65–99)
POTASSIUM: 4.5 mmol/L (ref 3.5–5.1)
Sodium: 135 mmol/L (ref 135–145)
TOTAL PROTEIN: 5.8 g/dL — AB (ref 6.5–8.1)

## 2017-10-11 LAB — CBC WITH DIFFERENTIAL/PLATELET
BASOS ABS: 0 10*3/uL (ref 0.0–0.1)
BASOS PCT: 0 %
EOS PCT: 0 %
Eosinophils Absolute: 0 10*3/uL (ref 0.0–0.7)
HEMATOCRIT: 35.5 % — AB (ref 36.0–46.0)
Hemoglobin: 11.6 g/dL — ABNORMAL LOW (ref 12.0–15.0)
Lymphocytes Relative: 12 %
Lymphs Abs: 1.1 10*3/uL (ref 0.7–4.0)
MCH: 29.9 pg (ref 26.0–34.0)
MCHC: 32.7 g/dL (ref 30.0–36.0)
MCV: 91.5 fL (ref 78.0–100.0)
MONO ABS: 0.7 10*3/uL (ref 0.1–1.0)
MONOS PCT: 7 %
Neutro Abs: 7.8 10*3/uL — ABNORMAL HIGH (ref 1.7–7.7)
Neutrophils Relative %: 81 %
PLATELETS: 252 10*3/uL (ref 150–400)
RBC: 3.88 MIL/uL (ref 3.87–5.11)
RDW: 15.8 % — AB (ref 11.5–15.5)
WBC: 9.6 10*3/uL (ref 4.0–10.5)

## 2017-10-11 MED ORDER — IOPAMIDOL (ISOVUE-300) INJECTION 61%
100.0000 mL | Freq: Once | INTRAVENOUS | Status: AC | PRN
  Administered 2017-10-11: 80 mL via INTRAVENOUS

## 2017-10-11 MED ORDER — FENTANYL CITRATE (PF) 100 MCG/2ML IJ SOLN
50.0000 ug | Freq: Once | INTRAMUSCULAR | Status: AC
  Administered 2017-10-12: 50 ug via INTRAVENOUS
  Filled 2017-10-11: qty 2

## 2017-10-11 MED ORDER — IOPAMIDOL (ISOVUE-300) INJECTION 61%
INTRAVENOUS | Status: AC
  Administered 2017-10-11: 80 mL via INTRAVENOUS
  Filled 2017-10-11: qty 100

## 2017-10-11 MED ORDER — SODIUM CHLORIDE 0.9 % IV BOLUS
1000.0000 mL | Freq: Once | INTRAVENOUS | Status: AC
  Administered 2017-10-11: 1000 mL via INTRAVENOUS

## 2017-10-11 NOTE — ED Provider Notes (Signed)
[facility] DEPT Provider Note  CSN: [id number] Arrival date & time: 10/11/17 1815  Chief Complaint(s) Abdominal Pain and Colon Cancer  HPI Tamara Yang is a 84 y.o. female   The history is provided by the patient.  Abdominal Pain   This is a recurrent problem. Episode onset: several weeks. Episode frequency: intermittent. Progression since onset: fluctuating. Associated with: constipation. Pain location: lower abd. The quality of the pain is colicky. The pain is moderate. Associated symptoms include nausea and constipation. Pertinent negatives include fever, diarrhea, vomiting and dysuria. The symptoms are aggravated by eating. Nothing relieves the symptoms.    Past Medical History Past Medical History:  Diagnosis Date  . Abdominal mass 09/19/2016  . Anxiety state, unspecified   . Aortic atherosclerosis ([place])   . Ascites   . Atrial fibrillation ([place])   . Carpal tunnel syndrome   . Cirrhosis ([place])   . Colon cancer ([place])    Stage 4  . Degeneration of intervertebral disc, site unspecified   . Depressive disorder, not elsewhere classified   . Dysphagia, unspecified(787.20)   . Edema   . Fatty liver   . Headache(784.0)   . Hearing loss   . Hydronephrosis of right kidney    obstruction  . Insomnia, unspecified   . Internal hemorrhoids   . Leg edema, right   . Liver cyst   . Liver lesion   . Lumbago   . Lumbar scoliosis   . Malignant neoplasm of breast (female), unspecified site   . Nonspecific elevation of levels of transaminase or lactic acid dehydrogenase (LDH)   . Osteoarthrosis, unspecified whether generalized or localized, unspecified site   . Otalgia, unspecified   . Other and unspecified hyperlipidemia   . Other chronic nonalcoholic liver disease   . Other malaise and fatigue   . Other malaise and fatigue   . Other nonspecific abnormal serum enzyme levels   . Pain in joint, lower leg   . Pain in joint, pelvic region and thigh     . Pain in joint, shoulder region   . Palpitations   . Pleural effusion on left 09/2017   Small   . Pleural effusion on right 09/2017  . Pre-diabetes   . Reflux esophagitis   . Tinnitus   . Unspecified essential hypertension   . Unspecified vitamin D deficiency    Patient Active Problem List   Diagnosis Date Noted  . Malignant neoplasm of colon ([place]) 10/09/2017  . Goals of care, counseling/discussion 09/27/2017  . Back pain 12/14/2016  . Anxiety 10/05/2016  . Cataract 10/05/2016  . GERD (gastroesophageal reflux disease) 10/05/2016  . Colon cancer ([place]) 10/04/2016  . PAF (paroxysmal atrial fibrillation) ([place]) 09/21/2016  . Leg edema, right 08/29/2016  . Abdominal mass 08/04/2016  . Loss of weight 05/25/2016  . Headache 01/20/2016  . Other malaise and fatigue 11/12/2013  . Unspecified vitamin D deficiency 11/21/2012  . Pain in joint, shoulder region   . Pain in joint, lower leg   . Insomnia, unspecified   . Diabetes ([place])   . Hyperlipidemia   . Essential hypertension   . Nonalcoholic steatohepatitis ([facility])   . Liver enzyme elevation   . Breast cancer ([place]) 07/04/1992   Home Medication(s) Prior to Admission medications   Medication Sig Start Date End Date Taking? Authorizing Provider  amLODipine (NORVASC) 5 MG tablet One daily to help control BP 12/14/16  Yes Estill Dooms, MD  cholecalciferol (VITAMIN D) 1000 UNITS tablet Take 2,000  Units by mouth every evening.    Yes [provider]  ELIQUIS 5 MG TABS tablet One twice daily for anticoagulation 12/14/16  Yes Estill Dooms, MD  LORazepam (ATIVAN) 0.5 MG tablet Take 1 tablet (0.5 mg total) by mouth 2 (two) times daily as needed for anxiety. 10/03/17  Yes Lauree Chandler, NP  metoprolol succinate (TOPROL-XL) 50 MG 24 hr tablet TAKE 1 TABLET BY MOUTH TWICE DAILY FOR BLOOD PRESSURE 12/14/16  Yes Estill Dooms, MD  oxyCODONE-acetaminophen (PERCOCET/ROXICET) 5-325 MG tablet Take 1-2 tablets by mouth every 6 (six)  hours as needed for severe pain. 09/27/17  Yes Owens Shark, NP  pantoprazole (PROTONIX) 40 MG tablet Take 1 tablet (40 mg total) by mouth daily. 09/14/17  Yes Lauree Chandler, NP  butalbital-acetaminophen-caffeine (FIORICET) [phone] MG tablet Take one or 2 tablets every 6 hours if needed for headache 05/08/17   Lauree Chandler, NP  lidocaine-prilocaine (EMLA) cream Apply to port site 1 hour prior to use. Do not rub in. Cover with plastic. 09/28/17   Ladell Pier, MD  loperamide (IMODIUM A-D) 2 MG tablet Take one tablet by mouth with each loose stool up to 8 tablets in 24 hours Patient not taking: Reported on 10/10/2017 05/10/16   Estill Dooms, MD  prochlorperazine (COMPAZINE) 5 MG tablet Take 1 tablet (5 mg total) by mouth every 6 (six) hours as needed for nausea or vomiting. 09/28/17   Ladell Pier, MD                                                                                                                                    Past Surgical History Past Surgical History:  Procedure Laterality Date  . ABDOMINAL HYSTERECTOMY  1981   with appendix removal, LSO  . APPENDECTOMY     with hysterectomy  . BREAST CYST EXCISION Left 1982  . BREAST LUMPECTOMY Left 1994   lymph cancer   . CATARACT EXTRACTION, BILATERAL  2016   [place]  . CHOLECYSTECTOMY  1987  . COLONOSCOPY WITH ESOPHAGOGASTRODUODENOSCOPY (EGD)  09/07/2016  . DILATION AND CURETTAGE OF UTERUS  1980  . IR FLUORO GUIDE PORT INSERTION RIGHT  10/06/2017  . IR US GUIDE VASC ACCESS RIGHT  10/06/2017  . LUMBAR DISC SURGERY  2003   L4-L5  . LUMBAR SPINE SURGERY  2002  . THORACENTESIS  09/15/2017  . TOTAL HIP ARTHROPLASTY Bilateral 2003   Family History Family History  Problem Relation Age of Onset  . Heart disease Mother   . Arthritis Mother   . Prostate cancer Father   . Diabetes Brother   . Prostate cancer Brother     Social History Social History   Tobacco Use  . Smoking status: Never Smoker  . Smokeless  tobacco: Never Used  Substance Use Topics  . Alcohol use: No  . Drug use: No   Allergies Daypro [oxaprozin]; Erythromycin; and  Talwin [pentazocine]  Review of Systems Review of Systems  Constitutional: Negative for fever.  Gastrointestinal: Positive for abdominal pain, constipation and nausea. Negative for diarrhea and vomiting.  Genitourinary: Negative for dysuria.   All other systems are reviewed and are negative for acute change except as noted in the HPI  Physical Exam Vital Signs  I have reviewed the triage vital signs BP 139/79 (BP Location: Right Arm)   Pulse (!) 102   Temp 98.2 F (36.8 C) (Oral)   Resp 20   Ht 5\' 5"  (1.651 m)   Wt 57.6 kg (127 lb)   SpO2 94%   BMI 21.13 kg/m   Physical Exam  Constitutional: She is oriented to person, place, and time. She appears well-developed and well-nourished. No distress.  HENT:  Head: Normocephalic and atraumatic.  Right Ear: External ear normal.  Left Ear: External ear normal.  Nose: Nose normal.  Eyes: Conjunctivae and EOM are normal. No scleral icterus.  Neck: Normal range of motion and phonation normal.  Cardiovascular: Normal rate and regular rhythm.  Pulmonary/Chest: Effort normal. No stridor. No respiratory distress.  Abdominal: She exhibits no distension. There is tenderness in the right lower quadrant, periumbilical area and suprapubic area. There is no rigidity, no rebound and no guarding.  Musculoskeletal: Normal range of motion. She exhibits no edema.  Neurological: She is alert and oriented to person, place, and time.  Skin: She is not diaphoretic.  Psychiatric: She has a normal mood and affect. Her behavior is normal.  Vitals reviewed.   ED Results and Treatments Labs (all labs ordered are listed, but only abnormal results are displayed) Labs Reviewed  CBC WITH DIFFERENTIAL/PLATELET - Abnormal; Notable for the following components:      Result Value   Hemoglobin 11.6 (*)    HCT 35.5 (*)    RDW 15.8  (*)    Neutro Abs 7.8 (*)    All other components within normal limits  COMPREHENSIVE METABOLIC PANEL - Abnormal; Notable for the following components:   Glucose, Bld 123 (*)    BUN 30 (*)    Creatinine, Ser 1.22 (*)    Total Protein 5.8 (*)    Albumin 2.5 (*)    ALT 9 (*)    GFR calc non Af Amer 40 (*)    GFR calc Af Amer 46 (*)    All other components within normal limits  URINALYSIS, ROUTINE W REFLEX MICROSCOPIC                                                                                                                         EKG  EKG Interpretation  Date/Time:    Ventricular Rate:    PR Interval:    QRS Duration:   QT Interval:    QTC Calculation:   R Axis:     Text Interpretation:        Radiology Ct Abdomen Pelvis W Contrast  Result Date: 10/11/2017 CLINICAL DATA:  Abdominal pain for  1 week, weakness since March. Status post paracentesis October 09, 2016. History of stage IV colon cancer, constipation, breast cancer, cholecystectomy, appendectomy and hysterectomy. EXAM: CT ABDOMEN AND PELVIS WITH CONTRAST TECHNIQUE: Multidetector CT imaging of the abdomen and pelvis was performed using the standard protocol following bolus administration of intravenous contrast. CONTRAST:  25mL ISOVUE-300 IOPAMIDOL (ISOVUE-300) INJECTION 61% COMPARISON:  CT abdomen and pelvis September 12, 2017 FINDINGS: LOWER CHEST: Moderate to large RIGHT and moderate LEFT pleural effusions are increased from prior CT. Tiny stable nodules RIGHT lung. Heart size is normal. Mild coronary artery calcifications. Superior vena cava catheter. HEPATOBILIARY: Numerous hepatic metastasis, some of which are new from prior CT with interval growth of previous existing metastasis, largest is 2.5 cm. Mildly nodular liver contour consistent with cirrhosis. Patent portal vein. Status post cholecystectomy. PANCREAS: Normal. SPLEEN: Normal. ADRENALS/URINARY TRACT: Kidneys are orthotopic, delayed RIGHT renal enhancement with  chronic RIGHT severe hydroureteronephrosis, transition point in RIGHT pelvis. STOMACH/BOWEL: Rectosigmoid bowel anastomosis. 7.4 cm stool distended rectum. Multiple loops of fluid distended small bowel measuring to 3.7 cm with air-fluid levels, transition point in the pelvis associated with adhesions. VASCULAR/LYMPHATIC: Aortoiliac vessels are normal in course and caliber. Mild calcific atherosclerosis. Worsening portal caval and porta hepatis lymphadenopathy now measuring to 18 mm short axis. REPRODUCTIVE: Status post hysterectomy. OTHER: Similar presacral 3.3 x 3.4 cm gas and fluid collection with tenting of adjacent bowel loops. Moderate volume ascites with mild peritoneal wall thickening consistent with metastasis. MUSCULOSKELETAL: Mild periosteal reaction of anterior sacral coccyx junction, similar. Punctate calcification and postsurgical changes LEFT breast. Streak artifact from bilateral hip total arthroplasties. Osteopenia. Moderate spondylosis. IMPRESSION: 1. Low-grade small bowel obstruction, transition point in the pelvis. Scattered small and large bowel air-fluid levels suggesting enteritis/ileus. Stool distended rectum. 2. Metastatic disease progression. Worsening hepatic metastasis and new perihepatic lymphadenopathy. 3. Moderate volume ascites. Similar presacral fluid collection with gas concerning for fistula. 4. Chronic severe RIGHT hydronephrosis. 5. Increasing pleural effusions are likely malignant. Aortic Atherosclerosis (ICD10-I70.0). Electronically Signed   By: Elon Alas M.D.   On: 10/11/2017 21:53   Pertinent labs & imaging results that were available during my care of the patient were reviewed by me and considered in my medical decision making (see chart for details).  Medications Ordered in ED Medications  fentaNYL (SUBLIMAZE) injection 50 mcg (has no administration in time range)  sodium chloride 0.9 % bolus 1,000 mL (0 mLs Intravenous Stopped 10/11/17 2115)  iopamidol  (ISOVUE-300) 61 % injection 100 mL (80 mLs Intravenous Contrast Given 10/11/17 2117)                                                                                                                                    Procedures Procedures  (including critical care time)  Medical Decision Making / ED Course I have reviewed the nursing notes for this encounter and the patient's prior records (if available in EHR or on provided  paperwork).    Work up consistent with SBO. Mild renal insufficiency similar to prior. NGT placed. Provided with pain meds, antiemetics, and IVF. Discussed with medicine for admission and SBO protocol.    Final Clinical Impression(s) / ED Diagnoses Final diagnoses:  SBO (small bowel obstruction) ([place])      This chart was dictated using voice recognition software.  Despite best efforts to proofread,  errors can occur which can change the documentation meaning.   Fatima Blank, MD 10/11/17 2342

## 2017-10-11 NOTE — Telephone Encounter (Signed)
I returned the phone call to Ms. Hulan Fray, Ms. Vanmeter sister.  She reports Ms. Buch is having significant abdominal pain, worse than earlier this week.  She is not having bowel movements despite laxatives.  No nausea or vomiting.  I recommended she take Ms. Gebert to the emergency department for evaluation.

## 2017-10-11 NOTE — Telephone Encounter (Signed)
Returned call to sister. Sister reports that pt is having stomach pain and unable to "go to the bathroom". "She's been taking Miralax for 3days and it hasn't helped". Also reported that "she has to see a cardiologist before her stent is placed and they can't see her until after May 17th. Will consult Lattie Haw NP. NP to call pt sister

## 2017-10-11 NOTE — ED Notes (Signed)
Pt reports that she is having rectum pain 8/10 sharp. Pt reports that pain worsens when she tries to bear down for a BM. Pt reports no BM x 5 days, and feels like she has a Smear. Pt states that she has been so weak the past week and feels as if she does not have the strength to evacuate stool. Pt sister is at bedside. Pt also has been to weak to ambulate the past few days per pt sister. Pt is alert and oriented x 4 and I verbally responsive.

## 2017-10-11 NOTE — ED Notes (Signed)
Pt sons are  at bedside.

## 2017-10-11 NOTE — ED Triage Notes (Signed)
Pt BIB EMS, Abd pain x 1 week, weakness since beginning of March. Pt has Colon Cancer, Port placed recently. Has not started chemo. Constipation reported. Denies N/V

## 2017-10-11 NOTE — ED Notes (Signed)
Bed: NX83 Expected date: 10/11/17 Expected time: 6:18 PM Means of arrival: Ambulance Comments: 84 yo colon cancer pt with abd pain

## 2017-10-12 ENCOUNTER — Inpatient Hospital Stay (HOSPITAL_COMMUNITY): Payer: Medicare HMO

## 2017-10-12 ENCOUNTER — Telehealth: Payer: Self-pay

## 2017-10-12 ENCOUNTER — Other Ambulatory Visit: Payer: Self-pay

## 2017-10-12 DIAGNOSIS — Z853 Personal history of malignant neoplasm of breast: Secondary | ICD-10-CM | POA: Diagnosis not present

## 2017-10-12 DIAGNOSIS — I10 Essential (primary) hypertension: Secondary | ICD-10-CM | POA: Diagnosis not present

## 2017-10-12 DIAGNOSIS — Z79899 Other long term (current) drug therapy: Secondary | ICD-10-CM | POA: Diagnosis not present

## 2017-10-12 DIAGNOSIS — C786 Secondary malignant neoplasm of retroperitoneum and peritoneum: Secondary | ICD-10-CM | POA: Diagnosis not present

## 2017-10-12 DIAGNOSIS — R188 Other ascites: Secondary | ICD-10-CM | POA: Diagnosis present

## 2017-10-12 DIAGNOSIS — Z8249 Family history of ischemic heart disease and other diseases of the circulatory system: Secondary | ICD-10-CM | POA: Diagnosis not present

## 2017-10-12 DIAGNOSIS — C800 Disseminated malignant neoplasm, unspecified: Secondary | ICD-10-CM | POA: Diagnosis present

## 2017-10-12 DIAGNOSIS — K56600 Partial intestinal obstruction, unspecified as to cause: Secondary | ICD-10-CM | POA: Diagnosis not present

## 2017-10-12 DIAGNOSIS — N1330 Unspecified hydronephrosis: Secondary | ICD-10-CM | POA: Diagnosis present

## 2017-10-12 DIAGNOSIS — R69 Illness, unspecified: Secondary | ICD-10-CM | POA: Diagnosis not present

## 2017-10-12 DIAGNOSIS — Z888 Allergy status to other drugs, medicaments and biological substances status: Secondary | ICD-10-CM | POA: Diagnosis not present

## 2017-10-12 DIAGNOSIS — K5900 Constipation, unspecified: Secondary | ICD-10-CM | POA: Diagnosis not present

## 2017-10-12 DIAGNOSIS — H9190 Unspecified hearing loss, unspecified ear: Secondary | ICD-10-CM | POA: Diagnosis present

## 2017-10-12 DIAGNOSIS — Z9841 Cataract extraction status, right eye: Secondary | ICD-10-CM | POA: Diagnosis not present

## 2017-10-12 DIAGNOSIS — F411 Generalized anxiety disorder: Secondary | ICD-10-CM | POA: Diagnosis present

## 2017-10-12 DIAGNOSIS — Z881 Allergy status to other antibiotic agents status: Secondary | ICD-10-CM | POA: Diagnosis not present

## 2017-10-12 DIAGNOSIS — I4891 Unspecified atrial fibrillation: Secondary | ICD-10-CM | POA: Diagnosis present

## 2017-10-12 DIAGNOSIS — Z4682 Encounter for fitting and adjustment of non-vascular catheter: Secondary | ICD-10-CM | POA: Diagnosis not present

## 2017-10-12 DIAGNOSIS — Z96643 Presence of artificial hip joint, bilateral: Secondary | ICD-10-CM | POA: Diagnosis present

## 2017-10-12 DIAGNOSIS — Z978 Presence of other specified devices: Secondary | ICD-10-CM | POA: Diagnosis not present

## 2017-10-12 DIAGNOSIS — Z90721 Acquired absence of ovaries, unilateral: Secondary | ICD-10-CM | POA: Diagnosis not present

## 2017-10-12 DIAGNOSIS — C7961 Secondary malignant neoplasm of right ovary: Secondary | ICD-10-CM | POA: Diagnosis not present

## 2017-10-12 DIAGNOSIS — K56609 Unspecified intestinal obstruction, unspecified as to partial versus complete obstruction: Secondary | ICD-10-CM

## 2017-10-12 DIAGNOSIS — Z66 Do not resuscitate: Secondary | ICD-10-CM | POA: Diagnosis present

## 2017-10-12 DIAGNOSIS — C778 Secondary and unspecified malignant neoplasm of lymph nodes of multiple regions: Secondary | ICD-10-CM | POA: Diagnosis not present

## 2017-10-12 DIAGNOSIS — C787 Secondary malignant neoplasm of liver and intrahepatic bile duct: Secondary | ICD-10-CM | POA: Diagnosis not present

## 2017-10-12 DIAGNOSIS — C187 Malignant neoplasm of sigmoid colon: Secondary | ICD-10-CM

## 2017-10-12 DIAGNOSIS — R140 Abdominal distension (gaseous): Secondary | ICD-10-CM | POA: Diagnosis not present

## 2017-10-12 DIAGNOSIS — Z7901 Long term (current) use of anticoagulants: Secondary | ICD-10-CM | POA: Diagnosis not present

## 2017-10-12 DIAGNOSIS — Z9049 Acquired absence of other specified parts of digestive tract: Secondary | ICD-10-CM | POA: Diagnosis not present

## 2017-10-12 DIAGNOSIS — M1990 Unspecified osteoarthritis, unspecified site: Secondary | ICD-10-CM

## 2017-10-12 DIAGNOSIS — Z90710 Acquired absence of both cervix and uterus: Secondary | ICD-10-CM | POA: Diagnosis not present

## 2017-10-12 DIAGNOSIS — N289 Disorder of kidney and ureter, unspecified: Secondary | ICD-10-CM

## 2017-10-12 DIAGNOSIS — Z9842 Cataract extraction status, left eye: Secondary | ICD-10-CM | POA: Diagnosis not present

## 2017-10-12 DIAGNOSIS — F329 Major depressive disorder, single episode, unspecified: Secondary | ICD-10-CM | POA: Diagnosis present

## 2017-10-12 DIAGNOSIS — C189 Malignant neoplasm of colon, unspecified: Secondary | ICD-10-CM | POA: Diagnosis not present

## 2017-10-12 LAB — CBC
HCT: 32.9 % — ABNORMAL LOW (ref 36.0–46.0)
HEMOGLOBIN: 10.7 g/dL — AB (ref 12.0–15.0)
MCH: 30.1 pg (ref 26.0–34.0)
MCHC: 32.5 g/dL (ref 30.0–36.0)
MCV: 92.4 fL (ref 78.0–100.0)
Platelets: 243 10*3/uL (ref 150–400)
RBC: 3.56 MIL/uL — AB (ref 3.87–5.11)
RDW: 15.9 % — ABNORMAL HIGH (ref 11.5–15.5)
WBC: 8.8 10*3/uL (ref 4.0–10.5)

## 2017-10-12 LAB — BASIC METABOLIC PANEL
ANION GAP: 11 (ref 5–15)
BUN: 25 mg/dL — ABNORMAL HIGH (ref 6–20)
CO2: 20 mmol/L — ABNORMAL LOW (ref 22–32)
Calcium: 8.7 mg/dL — ABNORMAL LOW (ref 8.9–10.3)
Chloride: 106 mmol/L (ref 101–111)
Creatinine, Ser: 1.04 mg/dL — ABNORMAL HIGH (ref 0.44–1.00)
GFR, EST AFRICAN AMERICAN: 56 mL/min — AB (ref >60)
GFR, EST NON AFRICAN AMERICAN: 49 mL/min — AB (ref >60)
GLUCOSE: 114 mg/dL — AB (ref 65–99)
POTASSIUM: 4 mmol/L (ref 3.5–5.1)
SODIUM: 137 mmol/L (ref 135–145)

## 2017-10-12 LAB — MAGNESIUM: MAGNESIUM: 1.8 mg/dL (ref 1.7–2.4)

## 2017-10-12 LAB — TSH: TSH: 6.901 u[IU]/mL — ABNORMAL HIGH (ref 0.350–4.500)

## 2017-10-12 MED ORDER — PANTOPRAZOLE SODIUM 40 MG IV SOLR
40.0000 mg | INTRAVENOUS | Status: DC
  Administered 2017-10-12: 40 mg via INTRAVENOUS
  Filled 2017-10-12: qty 40

## 2017-10-12 MED ORDER — OXYCODONE-ACETAMINOPHEN 5-325 MG PO TABS
1.0000 | ORAL_TABLET | Freq: Four times a day (QID) | ORAL | Status: DC | PRN
  Administered 2017-10-13 – 2017-10-14 (×4): 1 via ORAL
  Filled 2017-10-12 (×4): qty 1

## 2017-10-12 MED ORDER — PEG 3350-KCL-NA BICARB-NACL 420 G PO SOLR
4000.0000 mL | Freq: Once | ORAL | Status: AC
  Administered 2017-10-12: 4000 mL via ORAL
  Filled 2017-10-12: qty 4000

## 2017-10-12 MED ORDER — METOPROLOL SUCCINATE ER 25 MG PO TB24
25.0000 mg | ORAL_TABLET | Freq: Every day | ORAL | Status: DC
  Administered 2017-10-12 – 2017-10-14 (×3): 25 mg via ORAL
  Filled 2017-10-12 (×3): qty 1

## 2017-10-12 MED ORDER — DEXTROSE-NACL 5-0.45 % IV SOLN
INTRAVENOUS | Status: DC
  Administered 2017-10-12: 04:00:00 via INTRAVENOUS

## 2017-10-12 MED ORDER — FENTANYL CITRATE (PF) 100 MCG/2ML IJ SOLN
25.0000 ug | INTRAMUSCULAR | Status: DC | PRN
  Administered 2017-10-12 – 2017-10-13 (×4): 25 ug via INTRAVENOUS
  Filled 2017-10-12 (×3): qty 2

## 2017-10-12 MED ORDER — LIDOCAINE-PRILOCAINE 2.5-2.5 % EX CREA
TOPICAL_CREAM | Freq: Once | CUTANEOUS | Status: AC
  Administered 2017-10-12: 02:00:00 via TOPICAL

## 2017-10-12 MED ORDER — ACETAMINOPHEN 650 MG RE SUPP: 650.0000 mg | Freq: Four times a day (QID) | RECTAL | Status: DC | PRN

## 2017-10-12 MED ORDER — LIDOCAINE HCL 2 % EX GEL
1.0000 "application " | Freq: Once | CUTANEOUS | Status: AC
  Administered 2017-10-12: 1
  Filled 2017-10-12: qty 5

## 2017-10-12 MED ORDER — ONDANSETRON HCL 4 MG/2ML IJ SOLN: 4.0000 mg | Freq: Four times a day (QID) | INTRAMUSCULAR | Status: DC | PRN

## 2017-10-12 MED ORDER — ACETAMINOPHEN 325 MG PO TABS: 650.0000 mg | ORAL_TABLET | Freq: Four times a day (QID) | ORAL | Status: DC | PRN

## 2017-10-12 MED ORDER — ONDANSETRON HCL 4 MG PO TABS: 4.0000 mg | ORAL_TABLET | Freq: Four times a day (QID) | ORAL | Status: DC | PRN

## 2017-10-12 MED ORDER — FENTANYL CITRATE (PF) 100 MCG/2ML IJ SOLN
INTRAMUSCULAR | Status: AC
  Filled 2017-10-12: qty 2

## 2017-10-12 MED ORDER — APIXABAN 2.5 MG PO TABS
2.5000 mg | ORAL_TABLET | Freq: Two times a day (BID) | ORAL | Status: DC
  Administered 2017-10-12 – 2017-10-14 (×5): 2.5 mg via ORAL
  Filled 2017-10-12 (×5): qty 1

## 2017-10-12 MED ORDER — PHENOL 1.4 % MT LIQD
1.0000 | OROMUCOSAL | Status: DC | PRN
  Filled 2017-10-12: qty 177

## 2017-10-12 MED ORDER — AMLODIPINE BESYLATE 5 MG PO TABS: 5.0000 mg | ORAL_TABLET | Freq: Every day | ORAL | Status: DC

## 2017-10-12 MED ORDER — LORAZEPAM 0.5 MG PO TABS
0.5000 mg | ORAL_TABLET | Freq: Two times a day (BID) | ORAL | Status: DC | PRN
  Administered 2017-10-14: 0.5 mg via ORAL
  Filled 2017-10-12: qty 1

## 2017-10-12 NOTE — Care Management Note (Signed)
Case Management Note  Patient Details  Name: Tamara Yang MRN: [id number] Date of Birth: 20-May-1933  Subjective/Objective:   84 yo admitted with Bowel obstruction.    Hx of metastatic colon cancer.              Action/Plan: From home with son and grandson. Choice offered for home hospice services and [facility] chosen. [facility] called with referral. Pt states she has no DME needs for home.  Expected Discharge Date:  ([place])               Expected Discharge Plan:  Home w Hospice Care  In-House Referral:     Discharge planning Services  CM Consult  Post Acute Care Choice:  Hospice Choice offered to:  Patient  DME Arranged:    DME Agency:     HH Arranged:  Disease Management [place] Agency:  [facility]  Status of Service:  In process, will continue to follow  If discussed at Long Length of Stay Meetings, dates discussed:    Additional CommentsLynnell Catalan, RN 10/12/2017, 10:48 AM [phone]

## 2017-10-12 NOTE — Progress Notes (Signed)
Tamara Yang  is a 84 y.o. female, with past medical history significant for colon cancer, status post surgery, metastatic to the liver, atrial fib on Eliquis and chronic right hydronephrosis presenting with 2 weeks history of worsening abdominal discomfort, constipation and decreased p.o. intake.  The patient is being followed at the cancer center and had recently a right Port-A-Cath placed but has not been started on chemotherapy.  she was admitted for SBO, and NG tube placed. Today she had 2 big BM and is able to pass flatus, the NG TUBE IS clamped at this time.  Pt does nto want any chemotherapy at this time and would like be transitioned to home hospice. She requested the NG tube to be taken out.   Plan: Repeat ABD x ray and remove NG tube if it shows improvement.  Plan for d/c home with home hospice in am.    Hosie Poisson, MD [phone]

## 2017-10-12 NOTE — ED Notes (Signed)
ED TO INPATIENT HANDOFF REPORT  Name/Age/Gender Tamara Yang 84 y.o. female  Code Status   Home/SNF/Other Home  Chief Complaint abdominal pains and weakness  Level of Care/Admitting Diagnosis ED Disposition    ED Disposition Condition Comment   Admit  Hospital Area: [facility] [[id number]]  Level of Care: Med-Surg [16]  Diagnosis: Bowel obstruction [facility]) [[id number]]  Admitting Physician: Merton Border [[id number]]  Attending Physician: Laren Everts, [place]  Estimated length of stay: past midnight tomorrow  Certification:: I certify this patient will need inpatient services for at least 2 midnights  PT Class (Do Not Modify): Inpatient [101]  PT Acc Code (Do Not Modify): Private [1]       Medical History Past Medical History:  Diagnosis Date  . Abdominal mass 09/19/2016  . Anxiety state, unspecified   . Aortic atherosclerosis ([place])   . Ascites   . Atrial fibrillation ([place])   . Carpal tunnel syndrome   . Cirrhosis ([place])   . Colon cancer ([place])    Stage 4  . Degeneration of intervertebral disc, site unspecified   . Depressive disorder, not elsewhere classified   . Dysphagia, unspecified(787.20)   . Edema   . Fatty liver   . Headache(784.0)   . Hearing loss   . Hydronephrosis of right kidney    obstruction  . Insomnia, unspecified   . Internal hemorrhoids   . Leg edema, right   . Liver cyst   . Liver lesion   . Lumbago   . Lumbar scoliosis   . Malignant neoplasm of breast (female), unspecified site   . Nonspecific elevation of levels of transaminase or lactic acid dehydrogenase (LDH)   . Osteoarthrosis, unspecified whether generalized or localized, unspecified site   . Otalgia, unspecified   . Other and unspecified hyperlipidemia   . Other chronic nonalcoholic liver disease   . Other malaise and fatigue   . Other malaise and fatigue   . Other nonspecific abnormal serum enzyme levels   . Pain in joint, lower leg   . Pain in joint, pelvic region and  thigh   . Pain in joint, shoulder region   . Palpitations   . Pleural effusion on left 09/2017   Small   . Pleural effusion on right 09/2017  . Pre-diabetes   . Reflux esophagitis   . Tinnitus   . Unspecified essential hypertension   . Unspecified vitamin D deficiency     Allergies Allergies  Allergen Reactions  . Daypro [Oxaprozin]   . Erythromycin   . Talwin [Pentazocine]     IV Location/Drains/Wounds Patient Lines/Drains/Airways Status   Active Line/Drains/Airways    Name:   Placement date:   Placement time:   Site:   Days:   Implanted Port 10/06/17 Right Chest   10/06/17    1512    Chest   6   Peripheral IV 10/11/17 Right Antecubital   10/11/17    2110    Antecubital   1          Labs/Imaging Results for orders placed or performed during the hospital encounter of 10/11/17 (from the past 48 hour(s))  CBC with Differential/Platelet     Status: Abnormal   Collection Time: 10/11/17  7:19 PM  Result Value Ref Range   WBC 9.6 4.0 - 10.5 K/uL   RBC 3.88 3.87 - 5.11 MIL/uL   Hemoglobin 11.6 (L) 12.0 - 15.0 g/dL   HCT 35.5 (L) 36.0 - 46.0 %   MCV 91.5 78.0 -  100.0 fL   MCH 29.9 26.0 - 34.0 pg   MCHC 32.7 30.0 - 36.0 g/dL   RDW 15.8 (H) 11.5 - 15.5 %   Platelets 252 150 - 400 K/uL   Neutrophils Relative % 81 %   Neutro Abs 7.8 (H) 1.7 - 7.7 K/uL   Lymphocytes Relative 12 %   Lymphs Abs 1.1 0.7 - 4.0 K/uL   Monocytes Relative 7 %   Monocytes Absolute 0.7 0.1 - 1.0 K/uL   Eosinophils Relative 0 %   Eosinophils Absolute 0.0 0.0 - 0.7 K/uL   Basophils Relative 0 %   Basophils Absolute 0.0 0.0 - 0.1 K/uL    Comment: Performed at [facility], [place] [address], [place], [place] [address]  Comprehensive metabolic panel     Status: Abnormal   Collection Time: 10/11/17  7:19 PM  Result Value Ref Range   Sodium 135 135 - 145 mmol/L   Potassium 4.5 3.5 - 5.1 mmol/L   Chloride 102 101 - 111 mmol/L   CO2 23 22 - 32 mmol/L   Glucose, Bld 123 (H) 65 - 99  mg/dL   BUN 30 (H) 6 - 20 mg/dL   Creatinine, Ser 1.22 (H) 0.44 - 1.00 mg/dL   Calcium 9.3 8.9 - 10.3 mg/dL   Total Protein 5.8 (L) 6.5 - 8.1 g/dL   Albumin 2.5 (L) 3.5 - 5.0 g/dL   AST 23 15 - 41 U/L   ALT 9 (L) 14 - 54 U/L   Alkaline Phosphatase 52 38 - 126 U/L   Total Bilirubin 0.9 0.3 - 1.2 mg/dL   GFR calc non Af Amer 40 (L) >60 mL/min   GFR calc Af Amer 46 (L) >60 mL/min    Comment: (NOTE) The eGFR Yang been calculated using the CKD EPI equation. This calculation Yang not been validated in all clinical situations. eGFR's persistently <60 mL/min signify possible Chronic Kidney Disease.    Anion gap 10 5 - 15    Comment: Performed at [facility], [place] [address] [place], [place] [address]   Ct Abdomen Pelvis W Contrast  Result Date: 10/11/2017 CLINICAL DATA:  Abdominal pain for 1 week, weakness since March. Status post paracentesis October 09, 2016. History of stage IV colon cancer, constipation, breast cancer, cholecystectomy, appendectomy and hysterectomy. EXAM: CT ABDOMEN AND PELVIS WITH CONTRAST TECHNIQUE: Multidetector CT imaging of the abdomen and pelvis was performed using the standard protocol following bolus administration of intravenous contrast. CONTRAST:  66m ISOVUE-300 IOPAMIDOL (ISOVUE-300) INJECTION 61% COMPARISON:  CT abdomen and pelvis September 12, 2017 FINDINGS: LOWER CHEST: Moderate to large RIGHT and moderate LEFT pleural effusions are increased from prior CT. Tiny stable nodules RIGHT lung. Heart size is normal. Mild coronary artery calcifications. Superior vena cava catheter. HEPATOBILIARY: Numerous hepatic metastasis, some of which are new from prior CT with interval growth of previous existing metastasis, largest is 2.5 cm. Mildly nodular liver contour consistent with cirrhosis. Patent portal vein. Status post cholecystectomy. PANCREAS: Normal. SPLEEN: Normal. ADRENALS/URINARY TRACT: Kidneys are orthotopic, delayed RIGHT renal enhancement with chronic  RIGHT severe hydroureteronephrosis, transition point in RIGHT pelvis. STOMACH/BOWEL: Rectosigmoid bowel anastomosis. 7.4 cm stool distended rectum. Multiple loops of fluid distended small bowel measuring to 3.7 cm with air-fluid levels, transition point in the pelvis associated with adhesions. VASCULAR/LYMPHATIC: Aortoiliac vessels are normal in course and caliber. Mild calcific atherosclerosis. Worsening portal caval and porta hepatis lymphadenopathy now measuring to 18 mm short axis. REPRODUCTIVE: Status post hysterectomy. OTHER: Similar  presacral 3.3 x 3.4 cm gas and fluid collection with tenting of adjacent bowel loops. Moderate volume ascites with mild peritoneal wall thickening consistent with metastasis. MUSCULOSKELETAL: Mild periosteal reaction of anterior sacral coccyx junction, similar. Punctate calcification and postsurgical changes LEFT breast. Streak artifact from bilateral hip total arthroplasties. Osteopenia. Moderate spondylosis. IMPRESSION: 1. Low-grade small bowel obstruction, transition point in the pelvis. Scattered small and large bowel air-fluid levels suggesting enteritis/ileus. Stool distended rectum. 2. Metastatic disease progression. Worsening hepatic metastasis and new perihepatic lymphadenopathy. 3. Moderate volume ascites. Similar presacral fluid collection with gas concerning for fistula. 4. Chronic severe RIGHT hydronephrosis. 5. Increasing pleural effusions are likely malignant. Aortic Atherosclerosis (ICD10-I70.0). Electronically Signed   By: Elon Alas M.D.   On: 10/11/2017 21:53    Pending Labs Unresulted Labs (From admission, onward)   Start     Ordered   10/11/17 1903  Urinalysis, Routine w reflex microscopic  STAT,   R     10/11/17 1902   Signed and Held  TSH  Once,   R     Signed and Held   Signed and Occupational hygienist morning,   R     Signed and Held   Signed and Held  CBC  Tomorrow morning,   R     Signed and Held       Vitals/Pain Today's Vitals   10/11/17 1843 10/11/17 2134 10/11/17 2200 10/11/17 2300  BP:  126/85 (!) 147/92 (!) 159/86  Pulse:  (!) 109 99 (!) 108  Resp:  (!) 22 18 (!) 22  Temp:      TempSrc:      SpO2:  94% 96% 96%  Weight:      Height:      PainSc: 8        Isolation Precautions No active isolations  Medications Medications  fentaNYL (SUBLIMAZE) injection 50 mcg (Yang no administration in time range)  lidocaine (XYLOCAINE) 2 % jelly 1 application (Yang no administration in time range)  sodium chloride 0.9 % bolus 1,000 mL (0 mLs Intravenous Stopped 10/11/17 2115)  iopamidol (ISOVUE-300) 61 % injection 100 mL (80 mLs Intravenous Contrast Given 10/11/17 2117)    Mobility walks with device

## 2017-10-12 NOTE — Telephone Encounter (Signed)
Spoke with Sonia Baller in the [facility] call center who will inform Amy, RN that Dr. Benay Spice agrees to be the attending provider for pt in care of Hospice upon discharge from hospital.

## 2017-10-12 NOTE — H&P (Signed)
[facility]                                                                                    Patient Demographics  Tamara Yang, is a 84 y.o. female  CSN: [id number]  MRN: [id number]  DOB - 12-Feb-1933  Admit Date - 10/11/2017  Outpatient Primary MD for the patient is Lauree Chandler, NP   With History of -  Past Medical History:  Diagnosis Date  . Abdominal mass 09/19/2016  . Anxiety state, unspecified   . Aortic atherosclerosis ([place])   . Ascites   . Atrial fibrillation ([place])   . Carpal tunnel syndrome   . Cirrhosis ([place])   . Colon cancer ([place])    Stage 4  . Degeneration of intervertebral disc, site unspecified   . Depressive disorder, not elsewhere classified   . Dysphagia, unspecified(787.20)   . Edema   . Fatty liver   . Headache(784.0)   . Hearing loss   . Hydronephrosis of right kidney    obstruction  . Insomnia, unspecified   . Internal hemorrhoids   . Leg edema, right   . Liver cyst   . Liver lesion   . Lumbago   . Lumbar scoliosis   . Malignant neoplasm of breast (female), unspecified site   . Nonspecific elevation of levels of transaminase or lactic acid dehydrogenase (LDH)   . Osteoarthrosis, unspecified whether generalized or localized, unspecified site   . Otalgia, unspecified   . Other and unspecified hyperlipidemia   . Other chronic nonalcoholic liver disease   . Other malaise and fatigue   . Other malaise and fatigue   . Other nonspecific abnormal serum enzyme levels   . Pain in joint, lower leg   . Pain in joint, pelvic region and thigh   . Pain in joint, shoulder region   . Palpitations   . Pleural effusion on left 09/2017   Small   . Pleural effusion on right 09/2017  . Pre-diabetes   . Reflux esophagitis   . Tinnitus   . Unspecified essential hypertension   . Unspecified vitamin D deficiency       Past Surgical History:  Procedure Laterality Date  . ABDOMINAL HYSTERECTOMY  1981   with appendix removal, LSO   . APPENDECTOMY     with hysterectomy  . BREAST CYST EXCISION Left 1982  . BREAST LUMPECTOMY Left 1994   lymph cancer   . CATARACT EXTRACTION, BILATERAL  2016   [place]  . CHOLECYSTECTOMY  1987  . COLONOSCOPY WITH ESOPHAGOGASTRODUODENOSCOPY (EGD)  09/07/2016  . DILATION AND CURETTAGE OF UTERUS  1980  . IR FLUORO GUIDE PORT INSERTION RIGHT  10/06/2017  . IR US GUIDE VASC ACCESS RIGHT  10/06/2017  . LUMBAR DISC SURGERY  2003   L4-L5  . LUMBAR SPINE SURGERY  2002  . THORACENTESIS  09/15/2017  . TOTAL HIP ARTHROPLASTY Bilateral 2003    in for   Chief Complaint  Patient presents with  . Abdominal Pain  . Colon Cancer     HPI  Tamara Yang  is a 84 y.o. female, with past medical history significant for colon cancer, status post surgery,  metastatic to the liver, atrial fib on Eliquis and chronic right hydronephrosis presenting with 2 weeks history of worsening abdominal discomfort, constipation and decreased p.o. intake.  The patient is being followed at the cancer center and had recently a right Port-A-Cath placed but has not been started on chemotherapy.  The patient has to be cleared by cardiology for placement of a ureteral stent and is still pending.  Patient had paracentesis of abdominal fluid done on Monday with 4 L of fluids removed.    Review of Systems    In addition to the HPI above,  No Fever-chills, No Headache, No changes with Vision or hearing, No problems swallowing food or Liquids, No Chest pain, Cough or Shortness of Breath, No Blood in stool or Urine, No dysuria, No new skin rashes or bruises, No new joints pains-aches,  No recent weight gain , No polyuria, polydypsia or polyphagia, No significant Mental Stressors.  A full 10 point Review of Systems was done, except as stated above, all other Review of Systems were negative.   Social History Social History   Tobacco Use  . Smoking status: Never Smoker  . Smokeless tobacco: Never Used  Substance Use  Topics  . Alcohol use: No     Family History Family History  Problem Relation Age of Onset  . Heart disease Mother   . Arthritis Mother   . Prostate cancer Father   . Diabetes Brother   . Prostate cancer Brother      Prior to Admission medications   Medication Sig Start Date End Date Taking? Authorizing Provider  amLODipine (NORVASC) 5 MG tablet One daily to help control BP 12/14/16  Yes Estill Dooms, MD  cholecalciferol (VITAMIN D) 1000 UNITS tablet Take 2,000 Units by mouth every evening.    Yes [provider]  ELIQUIS 5 MG TABS tablet One twice daily for anticoagulation 12/14/16  Yes Estill Dooms, MD  LORazepam (ATIVAN) 0.5 MG tablet Take 1 tablet (0.5 mg total) by mouth 2 (two) times daily as needed for anxiety. 10/03/17  Yes Lauree Chandler, NP  metoprolol succinate (TOPROL-XL) 50 MG 24 hr tablet TAKE 1 TABLET BY MOUTH TWICE DAILY FOR BLOOD PRESSURE 12/14/16  Yes Estill Dooms, MD  oxyCODONE-acetaminophen (PERCOCET/ROXICET) 5-325 MG tablet Take 1-2 tablets by mouth every 6 (six) hours as needed for severe pain. 09/27/17  Yes Owens Shark, NP  pantoprazole (PROTONIX) 40 MG tablet Take 1 tablet (40 mg total) by mouth daily. 09/14/17  Yes Lauree Chandler, NP  butalbital-acetaminophen-caffeine (FIORICET) [phone] MG tablet Take one or 2 tablets every 6 hours if needed for headache 05/08/17   Lauree Chandler, NP  lidocaine-prilocaine (EMLA) cream Apply to port site 1 hour prior to use. Do not rub in. Cover with plastic. 09/28/17   Ladell Pier, MD  loperamide (IMODIUM A-D) 2 MG tablet Take one tablet by mouth with each loose stool up to 8 tablets in 24 hours Patient not taking: Reported on 10/10/2017 05/10/16   Estill Dooms, MD  prochlorperazine (COMPAZINE) 5 MG tablet Take 1 tablet (5 mg total) by mouth every 6 (six) hours as needed for nausea or vomiting. 09/28/17   Ladell Pier, MD    Allergies  Allergen Reactions  . Daypro [Oxaprozin]   .  Erythromycin   . Talwin [Pentazocine]     Physical Exam  Vitals  Blood pressure (!) 159/86, pulse (!) 108, temperature 98.2 F (36.8 C), temperature source Oral, resp. rate Marland Kitchen)  22, height 5\' 5"  (1.651 m), weight 57.6 kg (127 lb), SpO2 96 %.   1. General chronically ill, very pleasant, looks tired  2. Normal affect and insight, Not Suicidal or Homicidal, Awake Alert, Oriented X 3.  3. No F.N deficits, patient moving all extremities.  4. Ears and Eyes appear Normal, Conjunctivae clear, PERRLA. Moist Oral Mucosa.  5. Supple Neck, No JVD, No cervical lymphadenopathy appriciated, No Carotid Bruits.  6. Symmetrical Chest wall movement, Good air movement bilaterally, right Port-A-Cath noted.  7. RRR, No Gallops, Rubs or Murmurs, No Parasternal Heave.  8. Positive Bowel Sounds, Abdomen Soft, mild right upper quadrant tenderness, surgical wound well-healed.  9.  No Cyanosis, Normal Skin Turgor, No Skin Rash or Bruise.  10. Good muscle tone,  joints appear normal , no effusions, Normal ROM.    Data Review  CBC Recent Labs  Lab 10/06/17 1307 10/09/17 1248 10/11/17 1919  WBC 8.9 8.2 9.6  HGB 12.5  --  11.6*  HCT 37.1 35.3 35.5*  PLT 357 271 252  MCV 91.6 91.7 91.5  MCH 30.9 30.4 29.9  MCHC 33.7 33.2 32.7  RDW 15.3 15.6* 15.8*  LYMPHSABS 1.6 1.0 1.1  MONOABS 0.4 0.4 0.7  EOSABS 0.0 0.0 0.0  BASOSABS 0.0 0.0 0.0   ------------------------------------------------------------------------------------------------------------------  Chemistries  Recent Labs  Lab 10/06/17 1152 10/06/17 1307 10/09/17 1248 10/11/17 1919  NA 136 137 137 135  K 5.3* 5.2* 4.3 4.5  CL 101 101 105 102  CO2 24 24 23 23   GLUCOSE 121* 112* 111 123*  BUN 37* 37* 27* 30*  CREATININE 1.54* 1.50* 1.30* 1.22*  CALCIUM 10.1 10.0 9.9 9.3  AST  --   --  21 23  ALT  --   --  <6 9*  ALKPHOS  --   --  59 52  BILITOT  --   --  0.6 0.9    ------------------------------------------------------------------------------------------------------------------ estimated creatinine clearance is 32 mL/min (A) (by C-G formula based on SCr of 1.22 mg/dL (H)). ------------------------------------------------------------------------------------------------------------------ No results for input(s): TSH, T4TOTAL, T3FREE, THYROIDAB in the last 72 hours.  Invalid input(s): FREET3   Coagulation profile Recent Labs  Lab 10/06/17 1352  INR 1.16   ------------------------------------------------------------------------------------------------------------------- No results for input(s): DDIMER in the last 72 hours. -------------------------------------------------------------------------------------------------------------------  Cardiac Enzymes No results for input(s): CKMB, TROPONINI, MYOGLOBIN in the last 168 hours.  Invalid input(s): CK ------------------------------------------------------------------------------------------------------------------ Invalid input(s): POCBNP   ---------------------------------------------------------------------------------------------------------------  Urinalysis    Component Value Date/Time   APPEARANCEUR Clear 11/21/2012 1537   GLUCOSEU Negative 11/21/2012 1537   BILIRUBINUR neg 09/14/2017 1502   BILIRUBINUR Negative 11/21/2012 1537   PROTEINUR 30+ 09/14/2017 1502   PROTEINUR Negative 11/21/2012 1537   UROBILINOGEN 0.2 09/14/2017 1502   NITRITE positive 09/14/2017 1502   NITRITE Negative 11/21/2012 1537   LEUKOCYTESUR Moderate (2+) (A) 09/14/2017 1502   LEUKOCYTESUR Negative 11/21/2012 1537    ----------------------------------------------------------------------------------------------------------------   Imaging results:   Dg Chest 1 View  Result Date: 09/15/2017 CLINICAL DATA:  Status post right-sided thoracentesis. Metastatic colon cancer. EXAM: CHEST  1 VIEW COMPARISON:  CT  chest 09/12/2017 FINDINGS: The heart size is normal. The right pleural effusion is markedly decreased following thoracentesis. There is no pneumothorax. A smaller left pleural effusion is stable. Linear atelectasis is present in the right lower lobe. Surgical clips are present at the gallbladder fossa. Surgical clips are present in the left axilla. Moderate degenerative changes are noted at the shoulders bilaterally. IMPRESSION: 1. Marked decrease in right pleural effusion  following thoracentesis. No pneumothorax. 2. Residual small left pleural effusion. Electronically Signed   By: San Morelle M.D.   On: 09/15/2017 16:04   Dg Chest 2 View  Result Date: 10/09/2017 CLINICAL DATA:  Follow-up pleural effusion, shortness of breath. EXAM: CHEST - 2 VIEW COMPARISON:  10/06/2017. FINDINGS: The heart size and mediastinal contours are within normal limits. Small BILATERAL pleural effusions with basilar subsegmental atelectasis appear unchanged from radiograph of 10/06/2017. No osseous findings other than osteopenia. Port-A-Cath good position. IMPRESSION: Stable chest. Electronically Signed   By: Staci Righter M.D.   On: 10/09/2017 14:21   Dg Chest 2 View  Result Date: 10/06/2017 CLINICAL DATA:  Shortness of breath, cough. EXAM: CHEST - 2 VIEW COMPARISON:  Radiograph of September 15, 2017. FINDINGS: The heart size and mediastinal contours are within normal limits. Atherosclerosis of thoracic aorta is noted. Mild bilateral pleural effusions are noted with associated subsegmental atelectasis. The visualized skeletal structures are unremarkable. IMPRESSION: Mild bilateral pleural effusions with associated subsegmental atelectasis. Aortic Atherosclerosis (ICD10-I70.0). Electronically Signed   By: Marijo Conception, M.D.   On: 10/06/2017 17:26   Ct Chest W Contrast  Result Date: 09/13/2017 CLINICAL DATA:  Restaging of metastatic colon cancer. History of breast cancer. EXAM: CT CHEST, ABDOMEN, AND PELVIS WITH CONTRAST  TECHNIQUE: Multidetector CT imaging of the chest, abdomen and pelvis was performed following the standard protocol during bolus administration of intravenous contrast. CONTRAST:  152mL ISOVUE-300 IOPAMIDOL (ISOVUE-300) INJECTION 61% COMPARISON:  Multiple exams, including 03/28/2017 FINDINGS: CT CHEST FINDINGS Cardiovascular: Coronary, aortic arch, and branch vessel atherosclerotic vascular disease. Calcifications of the mitral valve noted. Mediastinum/Nodes: Small mediastinal lymph nodes are not pathologically enlarged. Lungs/Pleura: Large right and trace left pleural effusions. Mild enhancement along the right enhancement along the right parietal pleural surface and more notably along the right hemidiaphragm compatible with exudative effusion and height main concern for potential pleural tumor. There is new atelectasis along both the major fissure and minor fissure on the right. Calcified granuloma in the left upper lobe, image 60/4. Musculoskeletal: Notable degenerative glenohumeral arthropathy bilaterally. Thoracic kyphosis and mild thoracic spondylosis. Prior left axillary dissection CT ABDOMEN PELVIS FINDINGS Hepatobiliary: Hepatic morphology suggests cirrhosis. There scattered new hypodense masses throughout the liver, appearance favoring metastatic disease over infectious process. Index lesion in segment 2 of the liver measures 1.6 by 1.2 cm on image 45/2 and is new compared to the prior exam. There are also several pre-existing hepatic cysts. Cholecystectomy noted. Pancreas: Unremarkable Spleen: Unremarkable Adrenals/Urinary Tract: The adrenal glands unremarkable although there is a enlarged 10 mm lymph node near the right adrenal gland on image 50/2. Prominent right hydronephrosis with delayed nephrogram on the right, and right hydroureter extending down to the iliac vessel cross over where there is some indistinct soft tissue density in the expected vicinity of the ureter. The ureter below the iliac vessel  cross over is poorly seen due to edema and stranding along tissue planes, and also due to the streak artifact from the patient's bilateral hip implants. The urinary bladder is obscured by streak artifact from the hip implants. Delayed excretion from the right kidney on the delayed phase images. Stomach/Bowel: Anastomotic staple line in the rectum with poor definition of the rectosigmoid junction. The contrast con column in the sigmoid colon is observed but there is only a small amount of dilute contrast medium in the rectum. Posterior to the anastomosis there continues to be a presacral gas density with surrounding thick margination. The amount of gas is  minimally reduced from the prior exam but otherwise the region appears similar. Vascular/Lymphatic: Aortoiliac atherosclerotic vascular disease. Right gastric node 0.9 cm in short axis on image 48/2, previously 0.6 cm. Porta hepatis node 1.0 cm in short axis on image 56/2, formerly about 0.7 cm. Reproductive: Prior hysterectomy. Vaginal cuff obscured by streak artifact from the patient's bilateral hip implants. Other: Notable ascites with some extension into the lesser sac. Suspected omental caking of tumor in the left upper quadrant for example image 59/2. There is some enhancement along the peritoneal reflections. High suspicion for peritoneal spread of tumor. Musculoskeletal: Dextroconvex lumbar scoliosis with rotary component. Bilateral hip implants noted. Grade 1 degenerative retrolisthesis at L2-3 and L3-4. Facet arthropathy causes multilevel foraminal impingement in the lumbar spine and there is also central stenosis at the L2-3 level. IMPRESSION: 1. Progressive malignancy with numerous new hypodense liver lesions favoring metastatic disease; peritoneal spread of tumor with ascites, peritoneal enhancement, and some left upper quadrant omental caking of tumor; and progressive large right pleural effusion with pleural and diaphragmatic enhancement suspicious  for malignant effusion. 2. There is obstruction of the right kidney with prominent hydronephrosis and delayed excretion with some soft tissue density in the vicinity of the ureter at about the level of the iliac vessel cross over, possible ureteral metastatic lesion. The distal ureters in urinary bladder obscured by streak artifact from the patient's bilateral hip implants. 3. Persistent presacral gas density with surrounding soft tissue density, fistulous connection to the rectosigmoid anastomosis is not excluded. 4. Other imaging findings of potential clinical significance: Athero coronary atherosclerosis. Mitral valve calcification. Trace left pleural effusion. Advanced degenerative glenohumeral arthropathy. Cirrhosis. Mild upper abdominal adenopathy. Lumbar scoliosis with multilevel foraminal impingement and central stenosis at L2-3. Electronically Signed   By: Van Clines M.D.   On: 09/13/2017 10:18   Ct Abdomen Pelvis W Contrast  Result Date: 10/11/2017 CLINICAL DATA:  Abdominal pain for 1 week, weakness since March. Status post paracentesis October 09, 2016. History of stage IV colon cancer, constipation, breast cancer, cholecystectomy, appendectomy and hysterectomy. EXAM: CT ABDOMEN AND PELVIS WITH CONTRAST TECHNIQUE: Multidetector CT imaging of the abdomen and pelvis was performed using the standard protocol following bolus administration of intravenous contrast. CONTRAST:  74mL ISOVUE-300 IOPAMIDOL (ISOVUE-300) INJECTION 61% COMPARISON:  CT abdomen and pelvis September 12, 2017 FINDINGS: LOWER CHEST: Moderate to large RIGHT and moderate LEFT pleural effusions are increased from prior CT. Tiny stable nodules RIGHT lung. Heart size is normal. Mild coronary artery calcifications. Superior vena cava catheter. HEPATOBILIARY: Numerous hepatic metastasis, some of which are new from prior CT with interval growth of previous existing metastasis, largest is 2.5 cm. Mildly nodular liver contour consistent with  cirrhosis. Patent portal vein. Status post cholecystectomy. PANCREAS: Normal. SPLEEN: Normal. ADRENALS/URINARY TRACT: Kidneys are orthotopic, delayed RIGHT renal enhancement with chronic RIGHT severe hydroureteronephrosis, transition point in RIGHT pelvis. STOMACH/BOWEL: Rectosigmoid bowel anastomosis. 7.4 cm stool distended rectum. Multiple loops of fluid distended small bowel measuring to 3.7 cm with air-fluid levels, transition point in the pelvis associated with adhesions. VASCULAR/LYMPHATIC: Aortoiliac vessels are normal in course and caliber. Mild calcific atherosclerosis. Worsening portal caval and porta hepatis lymphadenopathy now measuring to 18 mm short axis. REPRODUCTIVE: Status post hysterectomy. OTHER: Similar presacral 3.3 x 3.4 cm gas and fluid collection with tenting of adjacent bowel loops. Moderate volume ascites with mild peritoneal wall thickening consistent with metastasis. MUSCULOSKELETAL: Mild periosteal reaction of anterior sacral coccyx junction, similar. Punctate calcification and postsurgical changes LEFT breast. Streak artifact from  bilateral hip total arthroplasties. Osteopenia. Moderate spondylosis. IMPRESSION: 1. Low-grade small bowel obstruction, transition point in the pelvis. Scattered small and large bowel air-fluid levels suggesting enteritis/ileus. Stool distended rectum. 2. Metastatic disease progression. Worsening hepatic metastasis and new perihepatic lymphadenopathy. 3. Moderate volume ascites. Similar presacral fluid collection with gas concerning for fistula. 4. Chronic severe RIGHT hydronephrosis. 5. Increasing pleural effusions are likely malignant. Aortic Atherosclerosis (ICD10-I70.0). Electronically Signed   By: Elon Alas M.D.   On: 10/11/2017 21:53   Ct Abdomen Pelvis W Contrast  Result Date: 09/13/2017 CLINICAL DATA:  Restaging of metastatic colon cancer. History of breast cancer. EXAM: CT CHEST, ABDOMEN, AND PELVIS WITH CONTRAST TECHNIQUE: Multidetector  CT imaging of the chest, abdomen and pelvis was performed following the standard protocol during bolus administration of intravenous contrast. CONTRAST:  142mL ISOVUE-300 IOPAMIDOL (ISOVUE-300) INJECTION 61% COMPARISON:  Multiple exams, including 03/28/2017 FINDINGS: CT CHEST FINDINGS Cardiovascular: Coronary, aortic arch, and branch vessel atherosclerotic vascular disease. Calcifications of the mitral valve noted. Mediastinum/Nodes: Small mediastinal lymph nodes are not pathologically enlarged. Lungs/Pleura: Large right and trace left pleural effusions. Mild enhancement along the right enhancement along the right parietal pleural surface and more notably along the right hemidiaphragm compatible with exudative effusion and height main concern for potential pleural tumor. There is new atelectasis along both the major fissure and minor fissure on the right. Calcified granuloma in the left upper lobe, image 60/4. Musculoskeletal: Notable degenerative glenohumeral arthropathy bilaterally. Thoracic kyphosis and mild thoracic spondylosis. Prior left axillary dissection CT ABDOMEN PELVIS FINDINGS Hepatobiliary: Hepatic morphology suggests cirrhosis. There scattered new hypodense masses throughout the liver, appearance favoring metastatic disease over infectious process. Index lesion in segment 2 of the liver measures 1.6 by 1.2 cm on image 45/2 and is new compared to the prior exam. There are also several pre-existing hepatic cysts. Cholecystectomy noted. Pancreas: Unremarkable Spleen: Unremarkable Adrenals/Urinary Tract: The adrenal glands unremarkable although there is a enlarged 10 mm lymph node near the right adrenal gland on image 50/2. Prominent right hydronephrosis with delayed nephrogram on the right, and right hydroureter extending down to the iliac vessel cross over where there is some indistinct soft tissue density in the expected vicinity of the ureter. The ureter below the iliac vessel cross over is poorly  seen due to edema and stranding along tissue planes, and also due to the streak artifact from the patient's bilateral hip implants. The urinary bladder is obscured by streak artifact from the hip implants. Delayed excretion from the right kidney on the delayed phase images. Stomach/Bowel: Anastomotic staple line in the rectum with poor definition of the rectosigmoid junction. The contrast con column in the sigmoid colon is observed but there is only a small amount of dilute contrast medium in the rectum. Posterior to the anastomosis there continues to be a presacral gas density with surrounding thick margination. The amount of gas is minimally reduced from the prior exam but otherwise the region appears similar. Vascular/Lymphatic: Aortoiliac atherosclerotic vascular disease. Right gastric node 0.9 cm in short axis on image 48/2, previously 0.6 cm. Porta hepatis node 1.0 cm in short axis on image 56/2, formerly about 0.7 cm. Reproductive: Prior hysterectomy. Vaginal cuff obscured by streak artifact from the patient's bilateral hip implants. Other: Notable ascites with some extension into the lesser sac. Suspected omental caking of tumor in the left upper quadrant for example image 59/2. There is some enhancement along the peritoneal reflections. High suspicion for peritoneal spread of tumor. Musculoskeletal: Dextroconvex lumbar scoliosis with rotary component.  Bilateral hip implants noted. Grade 1 degenerative retrolisthesis at L2-3 and L3-4. Facet arthropathy causes multilevel foraminal impingement in the lumbar spine and there is also central stenosis at the L2-3 level. IMPRESSION: 1. Progressive malignancy with numerous new hypodense liver lesions favoring metastatic disease; peritoneal spread of tumor with ascites, peritoneal enhancement, and some left upper quadrant omental caking of tumor; and progressive large right pleural effusion with pleural and diaphragmatic enhancement suspicious for malignant effusion.  2. There is obstruction of the right kidney with prominent hydronephrosis and delayed excretion with some soft tissue density in the vicinity of the ureter at about the level of the iliac vessel cross over, possible ureteral metastatic lesion. The distal ureters in urinary bladder obscured by streak artifact from the patient's bilateral hip implants. 3. Persistent presacral gas density with surrounding soft tissue density, fistulous connection to the rectosigmoid anastomosis is not excluded. 4. Other imaging findings of potential clinical significance: Athero coronary atherosclerosis. Mitral valve calcification. Trace left pleural effusion. Advanced degenerative glenohumeral arthropathy. Cirrhosis. Mild upper abdominal adenopathy. Lumbar scoliosis with multilevel foraminal impingement and central stenosis at L2-3. Electronically Signed   By: Van Clines M.D.   On: 09/13/2017 10:18   US Paracentesis  Result Date: 10/09/2017 INDICATION: Abdominal distention. Ascites. Request for diagnostic and therapeutic paracentesis up to 5 L max. EXAM: ULTRASOUND GUIDED LEFT LOWER QUADRANT PARACENTESIS MEDICATIONS: None. COMPLICATIONS: None immediate. PROCEDURE: Informed written consent was obtained from the patient after a discussion of the risks, benefits and alternatives to treatment. A timeout was performed prior to the initiation of the procedure. Initial ultrasound scanning demonstrates a large amount of ascites within the left lower abdominal quadrant. The left lower abdomen was prepped and draped in the usual sterile fashion. 1% lidocaine with epinephrine was used for local anesthesia. Following this, a 6 Fr Safe-T-Centesis catheter was introduced. An ultrasound image was saved for documentation purposes. The paracentesis was performed. The catheter was removed and a dressing was applied. The patient tolerated the procedure well without immediate post procedural complication. FINDINGS: A total of approximately 4.1  L of clear yellow fluid was removed. Samples were sent to the laboratory as requested by the clinical team. IMPRESSION: Successful ultrasound-guided paracentesis yielding 4.1 liters of peritoneal fluid. Read by: Ascencion Dike PA-C Electronically Signed   By: Markus Daft M.D.   On: 10/09/2017 16:56   Ir US Guide Vasc Access Right  Result Date: 10/06/2017 CLINICAL DATA:  Colon cancer EXAM: TUNNEL POWER PORT PLACEMENT WITH SUBCUTANEOUS POCKET UTILIZING ULTRASOUND & FLOUROSCOPY FLUOROSCOPY TIME:  48 seconds.  2.3 mGy. MEDICATIONS AND MEDICAL HISTORY: Versed 1.5 mg, Fentanyl 100 mcg. Additional Medications: Ancef 2 g. Antibiotics were given within 2 hours of the procedure. ANESTHESIA/SEDATION: Moderate sedation time: 23 minutes. Nursing monitored the the patient during the procedure. PROCEDURE: After written informed consent was obtained, patient was placed in the supine position on angiographic table. The right neck and chest was prepped and draped in a sterile fashion. Lidocaine was utilized for local anesthesia. The right jugular vein was noted to be patent initially with ultrasound. Under sonographic guidance, a micropuncture needle was inserted into the right IJ vein (Ultrasound and fluoroscopic image documentation was performed). The needle was removed over an 018 wire which was exchanged for a Amplatz. This was advanced into the IVC. An 8-French dilator was advanced over the Amplatz. A small incision was made in the right upper chest over the anterior right second rib. Utilizing blunt dissection, a subcutaneous pocket was created in the  caudal direction. The pocket was irrigated with a copious amount of sterile normal saline. The port catheter was tunneled from the chest incision, and out the neck incision. The reservoir was inserted into the subcutaneous pocket and secured with two 3-0 Ethilon stitches. A peel-away sheath was advanced over the Amplatz wire. The port catheter was cut to measure length and  inserted through the peel-away sheath. The peel-away sheath was removed. The chest incision was closed with 3-0 Vicryl interrupted stitches for the subcutaneous tissue and a running of 4-0 Vicryl subcuticular stitch for the skin. The neck incision was closed with a 4-0 Vicryl subcuticular stitch. Derma-bond was applied to both surgical incisions. The port reservoir was flushed and instilled with heparinized saline. No complications. FINDINGS: A right IJ vein Port-A-Cath is in place with its tip at the cavoatrial junction. COMPLICATIONS: None IMPRESSION: Successful 8 French right internal jugular vein power port placement with its tip at the SVC/RA junction. Electronically Signed   By: Marybelle Killings M.D.   On: 10/06/2017 15:41   Dg Abd 2 Views  Result Date: 10/09/2017 CLINICAL DATA:  84 year old female with metastatic colon cancer. History of breast cancer. Cramping and abdominal pain with constipation. Initial encounter. EXAM: ABDOMEN - 2 VIEW COMPARISON:  Chest x-ray same date dictated separately. 09/12/2017 CT abdomen and pelvis. FINDINGS: Gas-filled prominent size transverse colon measuring up to 6.7 cm. Stool within ascending colon, descending colon and rectum. Gas-filled small bowel loops central lower abdomen/pelvis may represent sigmoid colon rather than gas distended small bowel loops. Ascites. Bilateral pleural effusions. No free intraperitoneal air (on accompanying chest x-ray). Scoliosis lumbar spine convex right. Bilateral hip replacements. Scattered surgical clips and staples. IMPRESSION: Nonspecific bowel gas pattern without plain film evidence of bowel obstruction. Ascites and bilateral pleural effusions. Electronically Signed   By: Genia Del M.D.   On: 10/09/2017 14:12   Ir Fluoro Guide Port Insertion Right  Result Date: 10/06/2017 CLINICAL DATA:  Colon cancer EXAM: TUNNEL POWER PORT PLACEMENT WITH SUBCUTANEOUS POCKET UTILIZING ULTRASOUND & FLOUROSCOPY FLUOROSCOPY TIME:  48 seconds.  2.3  mGy. MEDICATIONS AND MEDICAL HISTORY: Versed 1.5 mg, Fentanyl 100 mcg. Additional Medications: Ancef 2 g. Antibiotics were given within 2 hours of the procedure. ANESTHESIA/SEDATION: Moderate sedation time: 23 minutes. Nursing monitored the the patient during the procedure. PROCEDURE: After written informed consent was obtained, patient was placed in the supine position on angiographic table. The right neck and chest was prepped and draped in a sterile fashion. Lidocaine was utilized for local anesthesia. The right jugular vein was noted to be patent initially with ultrasound. Under sonographic guidance, a micropuncture needle was inserted into the right IJ vein (Ultrasound and fluoroscopic image documentation was performed). The needle was removed over an 018 wire which was exchanged for a Amplatz. This was advanced into the IVC. An 8-French dilator was advanced over the Amplatz. A small incision was made in the right upper chest over the anterior right second rib. Utilizing blunt dissection, a subcutaneous pocket was created in the caudal direction. The pocket was irrigated with a copious amount of sterile normal saline. The port catheter was tunneled from the chest incision, and out the neck incision. The reservoir was inserted into the subcutaneous pocket and secured with two 3-0 Ethilon stitches. A peel-away sheath was advanced over the Amplatz wire. The port catheter was cut to measure length and inserted through the peel-away sheath. The peel-away sheath was removed. The chest incision was closed with 3-0 Vicryl interrupted stitches for the  subcutaneous tissue and a running of 4-0 Vicryl subcuticular stitch for the skin. The neck incision was closed with a 4-0 Vicryl subcuticular stitch. Derma-bond was applied to both surgical incisions. The port reservoir was flushed and instilled with heparinized saline. No complications. FINDINGS: A right IJ vein Port-A-Cath is in place with its tip at the cavoatrial  junction. COMPLICATIONS: None IMPRESSION: Successful 8 French right internal jugular vein power port placement with its tip at the SVC/RA junction. Electronically Signed   By: Marybelle Killings M.D.   On: 10/06/2017 15:41   US Thoracentesis Asp Pleural Space W/img Guide  Result Date: 09/15/2017 INDICATION: Symptomatic right sided pleural effusion EXAM: US THORACENTESIS ASP PLEURAL SPACE W/IMG GUIDE COMPARISON:  None. MEDICATIONS: 10 cc 1% lidocaine. COMPLICATIONS: None immediate. TECHNIQUE: Informed written consent was obtained from the patient after a discussion of the risks, benefits and alternatives to treatment. A timeout was performed prior to the initiation of the procedure. Initial ultrasound scanning demonstrates right pleural effusion. The lower chest was prepped and draped in the usual sterile fashion. 1% lidocaine was used for local anesthesia. Under direct ultrasound guidance, a 19 gauge, 7-cm, Yueh catheter was introduced. An ultrasound image was saved for documentation purposes. the thoracentesis was performed. The catheter was removed and a dressing was applied. The patient tolerated the procedure well without immediate post procedural complication. The patient was escorted to have an upright chest radiograph. FINDINGS: A total of approximately 1 liters of yellow fluid was removed. Requested samples were sent to the laboratory. IMPRESSION: Successful ultrasound-guided right sided thoracentesis yielding 1 liters of pleural fluid. Read by Lavonia Drafts [facility] Electronically Signed   By: Aletta Edouard M.D.   On: 09/15/2017 16:07    Assessment & Plan  1.  Small bowel obstruction /severe constipation 2.  History of colon cancer Metastatic status post surgery/colectomy      Ascites status post paracentesis of 4 L of fluid 2 days ago 3.  Chronic right hydronephrosis 4.  A. Fib  Plan  Continue with medications Insert NG tube and start GoLYTELY Full liquid diet for now Consult surgery in a.m.  to follow-up Consult oncology in a.m.   DVT Prophylaxis apixaban  AM Labs Ordered, also please review Full Orders  Family Communication: Admission, patients condition and plan of care including tests being ordered have been discussed with the patient and son who indicate understanding and agree with the plan and Code Status.  Code Status full  Disposition Plan: Home  Time spent in minutes : 43 minutes  Condition GUARDED   @SIGNATURE @

## 2017-10-12 NOTE — Progress Notes (Addendum)
[facility]- [facility] [place] Visit  Notified by Alinda Sierras [facility] of family request for Hospice and [place] services at home after discharge. Chart and patient information reviewed and hospice eligibility confirmed.   Spoke with patient, and son, Heron Sabins at bedside to initiate education related to hospice philosophy, services and team approach to care. Family verbalized understanding of the information provided. Per discussion, plan is for discharge to home by personal vehicle with son tomorrow.   Please send signed completed DNR form home with patient.  Patient will need prescriptions for discharge comfort medications.   DME needs discussed and family denies any needs.  [facility] aware of the above.  Completed discharge summary will need to be faxed to [facility] at [phone] when final.  Please notify [facility] when patient is ready to leave unit at discharge-call [phone].   [facility] information and contact numbers have been given to patient during visit.   Please call with any questions.   Thank You,  Freddi Starr RN, [facility] Liaison  [phone]

## 2017-10-12 NOTE — Progress Notes (Signed)
Nutrition Brief Note  Chart reviewed. Pt transitioning to comfort care.  No nutrition interventions warranted at this time.   Clayton Bibles, MS, RD, [place] Dietitian Pager: [phone] After Hours Pager: [phone]

## 2017-10-12 NOTE — Progress Notes (Signed)
IP PROGRESS NOTE  Subjective:   We saw Tamara Yang on 10/09/2017 with increased abdominal pain and constipation.  The abdomen was distended.  A plain x-ray showed no evidence of a bowel obstruction.  She underwent a paracentesis for 4 L of fluid.  She reports initial improvement in the abdominal distention, but she developed increased pain and presented to the emergency room yesterday.  A CT of the abdomen reveals evidence of a small bowel obstruction. She reports feeling better with the NG tube in place.  No bowel movement.  She complains of a sore throat.  Objective: Vital signs in last 24 hours: Blood pressure (!) 144/83, pulse 98, temperature 98.4 F (36.9 C), temperature source Oral, resp. rate 16, height '5\' 5"'$  (1.651 m), weight 139 lb 8.8 oz (63.3 kg), SpO2 95 %.  Intake/Output from previous day: 04/10 0701 - 04/11 0700 In: 1000 [IV Piggyback:1000] Out: 120 [Emesis/NG output:120]  Physical Exam:  HEENT: No thrush Lungs: Clear anteriorly, no respiratory distress Cardiac: Regular rate and rhythm Abdomen: Distended, tender in the right upper abdomen Extremities: No leg edema  Portacath/PICC-without erythema  Lab Results: Recent Labs    10/11/17 1919 10/12/17 0525  WBC 9.6 8.8  HGB 11.6* 10.7*  HCT 35.5* 32.9*  PLT 252 243    BMET Recent Labs    10/11/17 1919 10/12/17 0525  NA 135 137  K 4.5 4.0  CL 102 106  CO2 23 20*  GLUCOSE 123* 114*  BUN 30* 25*  CREATININE 1.22* 1.04*  CALCIUM 9.3 8.7*    Lab Results  Component Value Date   CEA1 1,693.06 (H) 10/09/2017    Studies/Results: Dg Abdomen 1 View  Result Date: 10/12/2017 CLINICAL DATA:  NG tube placement. EXAM: ABDOMEN - 1 VIEW COMPARISON:  CT yesterday. FINDINGS: Tip and side port of the enteric tube below the diaphragm in the stomach. Gaseous distention of small bowel is similar to prior CT. Excreted IV contrast in the urinary bladder. Cholecystectomy clips in the right upper quadrant. IMPRESSION: Tip and  side port of the enteric tube below the diaphragm in the stomach. Electronically Signed   By: Jeb Levering M.D.   On: 10/12/2017 02:59   Ct Abdomen Pelvis W Contrast  Result Date: 10/11/2017 CLINICAL DATA:  Abdominal pain for 1 week, weakness since March. Status post paracentesis October 09, 2016. History of stage IV colon cancer, constipation, breast cancer, cholecystectomy, appendectomy and hysterectomy. EXAM: CT ABDOMEN AND PELVIS WITH CONTRAST TECHNIQUE: Multidetector CT imaging of the abdomen and pelvis was performed using the standard protocol following bolus administration of intravenous contrast. CONTRAST:  8m ISOVUE-300 IOPAMIDOL (ISOVUE-300) INJECTION 61% COMPARISON:  CT abdomen and pelvis September 12, 2017 FINDINGS: LOWER CHEST: Moderate to large RIGHT and moderate LEFT pleural effusions are increased from prior CT. Tiny stable nodules RIGHT lung. Heart size is normal. Mild coronary artery calcifications. Superior vena cava catheter. HEPATOBILIARY: Numerous hepatic metastasis, some of which are new from prior CT with interval growth of previous existing metastasis, largest is 2.5 cm. Mildly nodular liver contour consistent with cirrhosis. Patent portal vein. Status post cholecystectomy. PANCREAS: Normal. SPLEEN: Normal. ADRENALS/URINARY TRACT: Kidneys are orthotopic, delayed RIGHT renal enhancement with chronic RIGHT severe hydroureteronephrosis, transition point in RIGHT pelvis. STOMACH/BOWEL: Rectosigmoid bowel anastomosis. 7.4 cm stool distended rectum. Multiple loops of fluid distended small bowel measuring to 3.7 cm with air-fluid levels, transition point in the pelvis associated with adhesions. VASCULAR/LYMPHATIC: Aortoiliac vessels are normal in course and caliber. Mild calcific atherosclerosis. Worsening portal caval  and porta hepatis lymphadenopathy now measuring to 18 mm short axis. REPRODUCTIVE: Status post hysterectomy. OTHER: Similar presacral 3.3 x 3.4 cm gas and fluid collection with  tenting of adjacent bowel loops. Moderate volume ascites with mild peritoneal wall thickening consistent with metastasis. MUSCULOSKELETAL: Mild periosteal reaction of anterior sacral coccyx junction, similar. Punctate calcification and postsurgical changes LEFT breast. Streak artifact from bilateral hip total arthroplasties. Osteopenia. Moderate spondylosis. IMPRESSION: 1. Low-grade small bowel obstruction, transition point in the pelvis. Scattered small and large bowel air-fluid levels suggesting enteritis/ileus. Stool distended rectum. 2. Metastatic disease progression. Worsening hepatic metastasis and new perihepatic lymphadenopathy. 3. Moderate volume ascites. Similar presacral fluid collection with gas concerning for fistula. 4. Chronic severe RIGHT hydronephrosis. 5. Increasing pleural effusions are likely malignant. Aortic Atherosclerosis (ICD10-I70.0). Electronically Signed   By: Elon Alas M.D.   On: 10/11/2017 21:53    Medications: I have reviewed the patient's current medications.  Assessment/Plan:  1. Metastatic colon cancer ? Sigmoid colectomy, right oophorectomy, omentectomy, and mesenteric implant biopsies at [facility] 09/30/2016 ? Sigmoid colon cancer (T4a,N1b,M1c), MSI-stable, mismatch repair protein expression intact ? Metastatic colon cancer involving the omentum, right ovary, mesenteric nodule biopsies\ ? CT chest/abdomen/pelvis 11/30/2016-no evidence of metastatic disease in the chest, abdomen, pelvis. 5 mm lymph node right of the IVC. Postsurgical fluid collection in the presacral space immediately adjacent to the rectal anastomosis with an air-fluid level. ? Foundation 1-no RASmutation; ERBB2 amplification; microsatellite stable; low tumor mutationalburden ? CTs 03/28/2017-new right pleural irregularity/nodularity, no other evidence of metastatic disease ? CTs 09/12/2017-progressive metastatic disease involving multiple liver lesions, peritoneal tumor, ascites, omental caking  in the left upper quadrant, large right pleural effusion, right hydronephrosis ? Right thoracentesis 09/15/2017-metastatic adenocarcinoma consistent with a colon primary ? CT abdomen/pelvis 10/11/2017- small bowel obstruction, progressive liver metastases, ascites, pleural effusions, right hydronephrosis   2. Left breast cancer 1994  3. Hypertension  4. Arthritis  5.Renal insufficiency-CT 09/12/2017 and 10/11/2017 with right hydronephrosis  6.   Port-A-Cath placement 10/06/2017  7.  Admission 10/11/2017 with a bowel obstruction   Tamara Yang has metastatic colon cancer.  She was scheduled for placement of a right ureter stent to be followed by FOLFOX chemotherapy.  She is now admitted with a bowel obstruction, likely secondary to carcinomatosis. I discussed the current situation with his Schoff and her son.  She indicates that she would like to proceed with a comfort approach.  She agrees to a [facility] referral. We discussed CPR and ACLS issues.  She agrees to a no CODE BLUE status.  Hopefully the bowel obstruction will improve with NG decompression and a few days of bowel rest.  If not, she may be a candidate for a palliative gastrostomy tube.  Recommendations: 1.  Continue NG decompression and bowel rest 2.  Cancel plans for the right ureter stent 3.  Narcotic analgesics as needed 4.  [facility] hospice referral for home hospice care.    LOS: 0 days   Betsy Coder, MD   10/12/2017, 8:35 AM

## 2017-10-13 ENCOUNTER — Encounter (HOSPITAL_COMMUNITY): Admission: RE | Payer: Self-pay | Source: Ambulatory Visit

## 2017-10-13 ENCOUNTER — Ambulatory Visit (HOSPITAL_COMMUNITY): Admission: RE | Admit: 2017-10-13 | Payer: Medicare HMO | Source: Ambulatory Visit | Admitting: Urology

## 2017-10-13 ENCOUNTER — Inpatient Hospital Stay (HOSPITAL_COMMUNITY): Payer: Medicare HMO

## 2017-10-13 DIAGNOSIS — K56600 Partial intestinal obstruction, unspecified as to cause: Secondary | ICD-10-CM

## 2017-10-13 DIAGNOSIS — C189 Malignant neoplasm of colon, unspecified: Secondary | ICD-10-CM

## 2017-10-13 LAB — GLUCOSE, CAPILLARY: Glucose-Capillary: 118 mg/dL — ABNORMAL HIGH (ref 65–99)

## 2017-10-13 SURGERY — CYSTOSCOPY, WITH RETROGRADE PYELOGRAM AND URETERAL STENT INSERTION
Anesthesia: General | Laterality: Right

## 2017-10-13 MED ORDER — HEPARIN SOD (PORK) LOCK FLUSH 100 UNIT/ML IV SOLN
500.0000 [IU] | Freq: Once | INTRAVENOUS | Status: AC
  Administered 2017-10-13: 500 [IU] via INTRAVENOUS
  Filled 2017-10-13: qty 5

## 2017-10-13 MED ORDER — PANTOPRAZOLE SODIUM 40 MG PO TBEC
40.0000 mg | DELAYED_RELEASE_TABLET | Freq: Every day | ORAL | Status: DC
  Administered 2017-10-13 – 2017-10-14 (×2): 40 mg via ORAL
  Filled 2017-10-13 (×2): qty 1

## 2017-10-13 MED ORDER — LIP MEDEX EX OINT
TOPICAL_OINTMENT | CUTANEOUS | Status: AC
  Administered 2017-10-13: 13:00:00
  Filled 2017-10-13: qty 7

## 2017-10-13 NOTE — Progress Notes (Signed)
PHARMACY BRIEF NOTE: PROTONIX IV TO PO CONVERSION  The patient is receiving Protonix by the intravenous route.  Based on criteria approved by the Pharmacy and [place], the medication is being converted to the equivalent oral dose form.  These criteria include: -No active GI bleeding -Able to tolerate diet of full liquids (or better) or tube feeding -Able to tolerate other medications by the oral or enteral route  If you have any questions about this conversion, please contact the Pharmacy Department at [phone]  Thank you, Clayburn Pert, PharmD, BCPS 10/13/2017  8:13 AM

## 2017-10-13 NOTE — Progress Notes (Signed)
[place] with patient, regarding discharge home with hospice. Patient unsure at this time if she will be discharging today or tomorrow.  She is concerned that was has only taken a couple of sips of fluid.  Spoke to RN, who plans to get patient up to chair this morning. Patient denies any questions or concerns re: hospice at this time. Per discussion, plan is for discharge to home by personal vehicle with son as early as later today.   Please send signed completed DNR form home with patient.  Patient will need prescriptions for discharge comfort medications.   DME needs discussed and family denies any needs.  [facility] aware of the above.  Completed discharge summary will need to be faxed to [facility] at [phone] when final.  Please notify [facility] when patient is ready to leave unit at discharge-call [phone].   [facility] information and contact numbers have been given to patient during visit.   Please call with any questions.   Thank You,  Freddi Starr RN, [facility] Liaison  [phone]

## 2017-10-13 NOTE — Progress Notes (Signed)
PROGRESS NOTE    Tamara Yang  WIO:[id number] DOB: October 27, 1932 DOA: 10/11/2017 PCP: Lauree Chandler, NP   Assessment & Plan:   Active Problems:   Bowel obstruction (HCC)  BRIEF NARRATIVE  Tamara Yang a84 y.o.female,with past medical history significant for colon cancer, status post surgery, metastatic to the liver, atrial fib on Eliquis and chronic right hydronephrosis presenting with 2 weeks history of worsening abdominal discomfort, constipation and decreased p.o. intake. The patient is being followed at the cancer center and had recently aright Port-A-Cath placed but has not been started on chemotherapy. she was admitted for SBO, and NG tube placed. yesterday she had 2 big BM and is able to pass flatus, the NG TUBE IS clamped at this time.  Pt does not want any chemotherapy at this time and would like be transitioned to home hospice. She requested the NG tube to be taken out. We have advanced her diet . Before leaving home with hospice, she reports feeling like passing out.  Vitals wnl. Discharge was discontinued and she was restarted onfluids. Will plan for d/c in am when stable.      DVT prophylaxis:SCD'S Code Status: DNR Family Communication: (discussed with sister at bedside.  Disposition Plan: home with home hospice in am.    Consultants:   Oncology.    Procedures: none.    Antimicrobials: none.    Subjective: Reports feeling dizzy and weak.   Objective: Vitals:   10/13/17 0543 10/13/17 1444 10/13/17 1446 10/13/17 1453  BP: (!) 123/97 135/71 133/77 111/67  Pulse: 86 91 90 (!) 113  Resp: 16 16 18 20   Temp: 98.2 F (36.8 C) 97.7 F (36.5 C) 97.7 F (36.5 C) 97.6 F (36.4 C)  TempSrc: Oral Oral Oral Oral  SpO2: 97% 98% 97% 100%  Weight:      Height:       No intake or output data in the 24 hours ending 10/13/17 1824 Filed Weights   10/11/17 1822 10/12/17 0124  Weight: 57.6 kg (127 lb) 63.3 kg (139 lb 8.8 oz)    Examination:  General  exam: Appears calm and comfortable  Respiratory system: Clear to auscultation. Respiratory effort normal. Cardiovascular system: S1 & S2 heard, RRR. No JVD, murmurs, rubs, gallops or clicks. No pedal edema. Gastrointestinal system: Abdomen is mildly distended, soft , mildly tender int he umbilical area.  Central nervous system: Alert and oriented. Non focal.  Extremities: Symmetric 5 x 5 power. Skin: No rashes, lesions or ulcers Psychiatry: . Mood & affect appropriate.     Data Reviewed: I have personally reviewed following labs and imaging studies  CBC: Recent Labs  Lab 10/09/17 1248 10/11/17 1919 10/12/17 0525  WBC 8.2 9.6 8.8  NEUTROABS 6.7* 7.8*  --   HGB  --  11.6* 10.7*  HCT 35.3 35.5* 32.9*  MCV 91.7 91.5 92.4  PLT 271 252 426   Basic Metabolic Panel: Recent Labs  Lab 10/09/17 1248 10/11/17 1919 10/12/17 0525  NA 137 135 137  K 4.3 4.5 4.0  CL 105 102 106  CO2 23 23 20*  GLUCOSE 111 123* 114*  BUN 27* 30* 25*  CREATININE 1.30* 1.22* 1.04*  CALCIUM 9.9 9.3 8.7*  MG  --   --  1.8   GFR: Estimated Creatinine Clearance: 37.5 mL/min (A) (by C-G formula based on SCr of 1.04 mg/dL (H)). Liver Function Tests: Recent Labs  Lab 10/09/17 1248 10/11/17 1919  AST 21 23  ALT <6 9*  ALKPHOS 59 52  BILITOT 0.6 0.9  PROT 6.2* 5.8*  ALBUMIN 2.6* 2.5*   No results for input(s): LIPASE, AMYLASE in the last 168 hours. No results for input(s): AMMONIA in the last 168 hours. Coagulation Profile: No results for input(s): INR, PROTIME in the last 168 hours. Cardiac Enzymes: No results for input(s): CKTOTAL, CKMB, CKMBINDEX, TROPONINI in the last 168 hours. BNP (last 3 results) No results for input(s): PROBNP in the last 8760 hours. HbA1C: No results for input(s): HGBA1C in the last 72 hours. CBG: Recent Labs  Lab 10/13/17 1445  GLUCAP 118*   Lipid Profile: No results for input(s): CHOL, HDL, LDLCALC, TRIG, CHOLHDL, LDLDIRECT in the last 72 hours. Thyroid  Function Tests: Recent Labs    10/12/17 0525  TSH 6.901*   Anemia Panel: No results for input(s): VITAMINB12, FOLATE, FERRITIN, TIBC, IRON, RETICCTPCT in the last 72 hours. Sepsis Labs: No results for input(s): PROCALCITON, LATICACIDVEN in the last 168 hours.  No results found for this or any previous visit (from the past 240 hour(s)).       Radiology Studies: Dg Abd 1 View  Result Date: 10/13/2017 CLINICAL DATA:  84 year old female with metastatic colon cancer. Possible small bowel obstruction. EXAM: ABDOMEN - 1 VIEW COMPARISON:  10/12/2017 and earlier. FINDINGS: Portable AP supine view at 0529 hours. NG tube has been removed. Stable cholecystectomy clips. Stable midline pelvic surgical clips and staple line. Paucity of small bowel gas. The terminal ileum appears normal. No dilated small bowel loops are evident. Multifocal mildly gas distended large bowel is unchanged since the CT on 10/11/2017. No definite pneumoperitoneum on this supine view. Excreted IV contrast in the urinary bladder. Osteopenia with bilateral hip arthroplasty. No acute osseous abnormality identified. IMPRESSION: 1. Enteric tube removed. 2. Decreased small bowel gas since yesterday. No dilated small bowel loops are evident. Unchanged gas-filled colon, perhaps colonic ileus. Electronically Signed   By: Genevie Ann M.D.   On: 10/13/2017 07:48   Dg Abd 1 View  Result Date: 10/12/2017 CLINICAL DATA:  Small bowel obstruction EXAM: ABDOMEN - 1 VIEW COMPARISON:  Study obtained earlier in the day FINDINGS: Nasogastric tube tip is at the level of the first portion of the duodenum with the side port near the pylorus. There is slightly less bowel dilatation compared to earlier in the day. No air-fluid levels. No free air. There are surgical clips in the pelvis and right upper quadrant. There are total hip replacements bilaterally. Contrast is seen in the urinary bladder. IMPRESSION: Nasogastric tube tip in proximal duodenum with side  port near the pylorus. There remain loops of mildly dilated bowel, slightly less than earlier in the day. No free air. Electronically Signed   By: Lowella Grip III M.D.   On: 10/12/2017 12:49   Dg Abdomen 1 View  Result Date: 10/12/2017 CLINICAL DATA:  NG tube placement. EXAM: ABDOMEN - 1 VIEW COMPARISON:  CT yesterday. FINDINGS: Tip and side port of the enteric tube below the diaphragm in the stomach. Gaseous distention of small bowel is similar to prior CT. Excreted IV contrast in the urinary bladder. Cholecystectomy clips in the right upper quadrant. IMPRESSION: Tip and side port of the enteric tube below the diaphragm in the stomach. Electronically Signed   By: Jeb Levering M.D.   On: 10/12/2017 02:59   Ct Abdomen Pelvis W Contrast  Result Date: 10/11/2017 CLINICAL DATA:  Abdominal pain for 1 week, weakness since March. Status post paracentesis October 09, 2016. History of stage IV colon cancer, constipation,  breast cancer, cholecystectomy, appendectomy and hysterectomy. EXAM: CT ABDOMEN AND PELVIS WITH CONTRAST TECHNIQUE: Multidetector CT imaging of the abdomen and pelvis was performed using the standard protocol following bolus administration of intravenous contrast. CONTRAST:  71mL ISOVUE-300 IOPAMIDOL (ISOVUE-300) INJECTION 61% COMPARISON:  CT abdomen and pelvis September 12, 2017 FINDINGS: LOWER CHEST: Moderate to large RIGHT and moderate LEFT pleural effusions are increased from prior CT. Tiny stable nodules RIGHT lung. Heart size is normal. Mild coronary artery calcifications. Superior vena cava catheter. HEPATOBILIARY: Numerous hepatic metastasis, some of which are new from prior CT with interval growth of previous existing metastasis, largest is 2.5 cm. Mildly nodular liver contour consistent with cirrhosis. Patent portal vein. Status post cholecystectomy. PANCREAS: Normal. SPLEEN: Normal. ADRENALS/URINARY TRACT: Kidneys are orthotopic, delayed RIGHT renal enhancement with chronic RIGHT severe  hydroureteronephrosis, transition point in RIGHT pelvis. STOMACH/BOWEL: Rectosigmoid bowel anastomosis. 7.4 cm stool distended rectum. Multiple loops of fluid distended small bowel measuring to 3.7 cm with air-fluid levels, transition point in the pelvis associated with adhesions. VASCULAR/LYMPHATIC: Aortoiliac vessels are normal in course and caliber. Mild calcific atherosclerosis. Worsening portal caval and porta hepatis lymphadenopathy now measuring to 18 mm short axis. REPRODUCTIVE: Status post hysterectomy. OTHER: Similar presacral 3.3 x 3.4 cm gas and fluid collection with tenting of adjacent bowel loops. Moderate volume ascites with mild peritoneal wall thickening consistent with metastasis. MUSCULOSKELETAL: Mild periosteal reaction of anterior sacral coccyx junction, similar. Punctate calcification and postsurgical changes LEFT breast. Streak artifact from bilateral hip total arthroplasties. Osteopenia. Moderate spondylosis. IMPRESSION: 1. Low-grade small bowel obstruction, transition point in the pelvis. Scattered small and large bowel air-fluid levels suggesting enteritis/ileus. Stool distended rectum. 2. Metastatic disease progression. Worsening hepatic metastasis and new perihepatic lymphadenopathy. 3. Moderate volume ascites. Similar presacral fluid collection with gas concerning for fistula. 4. Chronic severe RIGHT hydronephrosis. 5. Increasing pleural effusions are likely malignant. Aortic Atherosclerosis (ICD10-I70.0). Electronically Signed   By: Elon Alas M.D.   On: 10/11/2017 21:53        Scheduled Meds: . apixaban  2.5 mg Oral BID  . metoprolol succinate  25 mg Oral Daily  . pantoprazole  40 mg Oral Daily   Continuous Infusions: . dextrose 5 % and 0.45% NaCl 50 mL/hr at 10/12/17 0400     LOS: 1 day    Time spent: 30 minutes.     Hosie Poisson, MD [facility] Pager [phone]   If 7PM-7AM, please contact night-coverage [email or web address] Password  [facility] 10/13/2017, 6:24 PM

## 2017-10-13 NOTE — Progress Notes (Signed)
IP PROGRESS NOTE  Subjective:   Tamara Yang reports improvement in abdominal distention.  The NG tube was removed yesterday.  She has not vomited since the tube was removed.  She reports having 2 bowel movements yesterday.  She met with the hospice team yesterday.  She plans to go home with hospice care.  Objective: Vital signs in last 24 hours: Blood pressure (!) 123/97, pulse 86, temperature 98.2 F (36.8 C), temperature source Oral, resp. rate 16, height 5' 5" (1.651 m), weight 139 lb 8.8 oz (63.3 kg), SpO2 97 %.  Intake/Output from previous day: No intake/output data recorded.  Physical Exam:  HEENT: No thrush  Abdomen: Less distended, tender in the right upper abdomen with masslike fullness   Portacath/PICC-without erythema  Lab Results: Recent Labs    10/11/17 1919 10/12/17 0525  WBC 9.6 8.8  HGB 11.6* 10.7*  HCT 35.5* 32.9*  PLT 252 243    BMET Recent Labs    10/11/17 1919 10/12/17 0525  NA 135 137  K 4.5 4.0  CL 102 106  CO2 23 20*  GLUCOSE 123* 114*  BUN 30* 25*  CREATININE 1.22* 1.04*  CALCIUM 9.3 8.7*    Lab Results  Component Value Date   CEA1 1,693.06 (H) 10/09/2017    Studies/Results: Dg Abd 1 View  Result Date: 10/13/2017 CLINICAL DATA:  84-year-old female with metastatic colon cancer. Possible small bowel obstruction. EXAM: ABDOMEN - 1 VIEW COMPARISON:  10/12/2017 and earlier. FINDINGS: Portable AP supine view at 0529 hours. NG tube has been removed. Stable cholecystectomy clips. Stable midline pelvic surgical clips and staple line. Paucity of small bowel gas. The terminal ileum appears normal. No dilated small bowel loops are evident. Multifocal mildly gas distended large bowel is unchanged since the CT on 10/11/2017. No definite pneumoperitoneum on this supine view. Excreted IV contrast in the urinary bladder. Osteopenia with bilateral hip arthroplasty. No acute osseous abnormality identified. IMPRESSION: 1. Enteric tube removed. 2. Decreased  small bowel gas since yesterday. No dilated small bowel loops are evident. Unchanged gas-filled colon, perhaps colonic ileus. Electronically Signed   By: H  Hall M.D.   On: 10/13/2017 07:48   Dg Abd 1 View  Result Date: 10/12/2017 CLINICAL DATA:  Small bowel obstruction EXAM: ABDOMEN - 1 VIEW COMPARISON:  Study obtained earlier in the day FINDINGS: Nasogastric tube tip is at the level of the first portion of the duodenum with the side port near the pylorus. There is slightly less bowel dilatation compared to earlier in the day. No air-fluid levels. No free air. There are surgical clips in the pelvis and right upper quadrant. There are total hip replacements bilaterally. Contrast is seen in the urinary bladder. IMPRESSION: Nasogastric tube tip in proximal duodenum with side port near the pylorus. There remain loops of mildly dilated bowel, slightly less than earlier in the day. No free air. Electronically Signed   By: William  Woodruff III M.D.   On: 10/12/2017 12:49   Dg Abdomen 1 View  Result Date: 10/12/2017 CLINICAL DATA:  NG tube placement. EXAM: ABDOMEN - 1 VIEW COMPARISON:  CT yesterday. FINDINGS: Tip and side port of the enteric tube below the diaphragm in the stomach. Gaseous distention of small bowel is similar to prior CT. Excreted IV contrast in the urinary bladder. Cholecystectomy clips in the right upper quadrant. IMPRESSION: Tip and side port of the enteric tube below the diaphragm in the stomach. Electronically Signed   By: Melanie  Ehinger M.D.   On: 10/12/2017   02:59   Ct Abdomen Pelvis W Contrast  Result Date: 10/11/2017 CLINICAL DATA:  Abdominal pain for 1 week, weakness since March. Status post paracentesis October 09, 2016. History of stage IV colon cancer, constipation, breast cancer, cholecystectomy, appendectomy and hysterectomy. EXAM: CT ABDOMEN AND PELVIS WITH CONTRAST TECHNIQUE: Multidetector CT imaging of the abdomen and pelvis was performed using the standard protocol following  bolus administration of intravenous contrast. CONTRAST:  80mL ISOVUE-300 IOPAMIDOL (ISOVUE-300) INJECTION 61% COMPARISON:  CT abdomen and pelvis September 12, 2017 FINDINGS: LOWER CHEST: Moderate to large RIGHT and moderate LEFT pleural effusions are increased from prior CT. Tiny stable nodules RIGHT lung. Heart size is normal. Mild coronary artery calcifications. Superior vena cava catheter. HEPATOBILIARY: Numerous hepatic metastasis, some of which are new from prior CT with interval growth of previous existing metastasis, largest is 2.5 cm. Mildly nodular liver contour consistent with cirrhosis. Patent portal vein. Status post cholecystectomy. PANCREAS: Normal. SPLEEN: Normal. ADRENALS/URINARY TRACT: Kidneys are orthotopic, delayed RIGHT renal enhancement with chronic RIGHT severe hydroureteronephrosis, transition point in RIGHT pelvis. STOMACH/BOWEL: Rectosigmoid bowel anastomosis. 7.4 cm stool distended rectum. Multiple loops of fluid distended small bowel measuring to 3.7 cm with air-fluid levels, transition point in the pelvis associated with adhesions. VASCULAR/LYMPHATIC: Aortoiliac vessels are normal in course and caliber. Mild calcific atherosclerosis. Worsening portal caval and porta hepatis lymphadenopathy now measuring to 18 mm short axis. REPRODUCTIVE: Status post hysterectomy. OTHER: Similar presacral 3.3 x 3.4 cm gas and fluid collection with tenting of adjacent bowel loops. Moderate volume ascites with mild peritoneal wall thickening consistent with metastasis. MUSCULOSKELETAL: Mild periosteal reaction of anterior sacral coccyx junction, similar. Punctate calcification and postsurgical changes LEFT breast. Streak artifact from bilateral hip total arthroplasties. Osteopenia. Moderate spondylosis. IMPRESSION: 1. Low-grade small bowel obstruction, transition point in the pelvis. Scattered small and large bowel air-fluid levels suggesting enteritis/ileus. Stool distended rectum. 2. Metastatic disease  progression. Worsening hepatic metastasis and new perihepatic lymphadenopathy. 3. Moderate volume ascites. Similar presacral fluid collection with gas concerning for fistula. 4. Chronic severe RIGHT hydronephrosis. 5. Increasing pleural effusions are likely malignant. Aortic Atherosclerosis (ICD10-I70.0). Electronically Signed   By: Courtnay  Bloomer M.D.   On: 10/11/2017 21:53    Medications: I have reviewed the patient's current medications.  Assessment/Plan:  1. Metastatic colon cancer ? Sigmoid colectomy, right oophorectomy, omentectomy, and mesenteric implant biopsies at [facility] 09/30/2016 ? Sigmoid colon cancer (T4a,N1b,M1c), MSI-stable, mismatch repair protein expression intact ? Metastatic colon cancer involving the omentum, right ovary, mesenteric nodule biopsies\ ? CT chest/abdomen/pelvis 11/30/2016-no evidence of metastatic disease in the chest, abdomen, pelvis. 5 mm lymph node right of the IVC. Postsurgical fluid collection in the presacral space immediately adjacent to the rectal anastomosis with an air-fluid level. ? Foundation 1-no RASmutation; ERBB2 amplification; microsatellite stable; low tumor mutationalburden ? CTs 03/28/2017-new right pleural irregularity/nodularity, no other evidence of metastatic disease ? CTs 09/12/2017-progressive metastatic disease involving multiple liver lesions, peritoneal tumor, ascites, omental caking in the left upper quadrant, large right pleural effusion, right hydronephrosis ? Right thoracentesis 09/15/2017-metastatic adenocarcinoma consistent with a colon primary ? CT abdomen/pelvis 10/11/2017- small bowel obstruction, progressive liver metastases, ascites, pleural effusions, right hydronephrosis   2. Left breast cancer 1994  3. Hypertension  4. Arthritis  5.Renal insufficiency-CT 09/12/2017 and 10/11/2017 with right hydronephrosis  6.   Port-A-Cath placement 10/06/2017  7.  Admission 10/11/2017 with a bowel obstruction- clinical  improvement with NGT decompression and laxatives, plain x-ray 10/13/2017-decreased small bowel gas, no dilated small bowel loops   Tamara Yang   has metastatic colon cancer.  She was admitted with a bowel obstruction, likely secondary to carcinomatosis.  Her symptoms improved with NG decompression and bowel rest.  She has not vomited since the NG tube was removed yesterday.  I recommend she follow a liquid diet and continue a laxative regimen.  She plans to go home with hospice care.  I will follow her with the [facility] hospice program.   Recommendations: 1.  Liquid diet 2.  Continue stool softener and MiraLAX 3.  Continue oxycodone for pain 4.  Consider consolidating medical regimen for comfort care 5.  Please call Oncology as needed.  Outpatient follow-up will be scheduled at the cancer center.    LOS: 1 day   Gary Sherrill, MD   10/13/2017, 11:42 AM  

## 2017-10-14 NOTE — Progress Notes (Signed)
[place] with patient, regarding discharge home with hospice.  Patient to discharge this morning by private vehicle, son is picking up.  Patient expresses concerns over her weakness, but advised she is ready to go.   Please send signed completed DNR form home with patient.  Patient will need prescriptions for discharge comfort medications.   DME needs discussed and familydenies any needs.  [facility] aware of the above.  Completed discharge summary will need to be faxed to [facility] at [phone] when final.  Please notify [facility] when patient is ready to leave unit at discharge-call [phone].   [facility] information and contact numbers have been given topatientduring visit.   Please call with any questions.   Thank You,  Edyth Gunnels, RN, BSN  [facility] Liaison  [phone]  All hospital liaisons are now on [place].

## 2017-10-15 NOTE — Discharge Summary (Signed)
Physician Discharge Summary  KORTNEY POTVIN KGM:[id number] DOB: 1933/06/05 DOA: 10/11/2017  PCP: Lauree Chandler, NP  Admit date: 10/11/2017 Discharge date: 10/14/2017  Admitted From: Home.  Disposition: Home with Home hospice.   Recommendations for Outpatient Follow-up:  1. Follow up with PCP in 1-2 weeks 2. Please obtain BMP/CBC in one week 3. Please follow up with hospice as recommended.     Discharge Conditio: hospice.  CODE STATUS:full code.  Diet recommendation: soft diet.  Brief/Interim Summary: [name],with past medical history significant for colon cancer, status post surgery, metastatic to the liver, atrial fib on Eliquis and chronic right hydronephrosis presenting with 2 weeks history of worsening abdominal discomfort, constipation and decreased p.o. intake. The patient is being followed at the cancer center and had recently aright Port-A-Cath placed but has not been started on chemotherapy.she was admitted for SBO, and NG tube placed. yesterday she had 2 big BM and is able to pass flatus, the NG TUBE was clamped.  Pt does not want any chemotherapy at this time and would like be transitioned to home hospice. She requested the NG tube to be taken out. We have advanced her diet .  On 4/12 prior to discharging her, she felt dizzy and feltlike passing out.  Vitals wnl. cbg is wnl.  D/c pt on 4/13, hospice follow up is scheduled.      Discharge Diagnoses:  Active Problems:   Bowel obstruction (HCC) metastatic colon cancer with involvement of omentum.    Discharge Instructions  Discharge Instructions    Diet - low sodium heart healthy   Complete by:  As directed    Discharge instructions   Complete by:  As directed    Home with home hospice.  Please follow up with Dr Learta Codding as recommended.     Allergies as of 10/14/2017      Reactions   Daypro [oxaprozin]    Erythromycin    Talwin [pentazocine]       Medication List    STOP  taking these medications   amLODipine 5 MG tablet Commonly known as:  NORVASC   loperamide 2 MG tablet Commonly known as:  IMODIUM A-D     TAKE these medications   butalbital-acetaminophen-caffeine 50-325-40 MG tablet Commonly known as:  FIORICET Take one or 2 tablets every 6 hours if needed for headache   cholecalciferol 1000 units tablet Commonly known as:  VITAMIN D Take 2,000 Units by mouth every evening.   ELIQUIS 5 MG Tabs tablet Generic drug:  apixaban One twice daily for anticoagulation   lidocaine-prilocaine cream Commonly known as:  EMLA Apply to port site 1 hour prior to use. Do not rub in. Cover with plastic.   LORazepam 0.5 MG tablet Commonly known as:  ATIVAN Take 1 tablet (0.5 mg total) by mouth 2 (two) times daily as needed for anxiety.   metoprolol succinate 50 MG 24 hr tablet Commonly known as:  TOPROL-XL TAKE 1 TABLET BY MOUTH TWICE DAILY FOR BLOOD PRESSURE   oxyCODONE-acetaminophen 5-325 MG tablet Commonly known as:  PERCOCET/ROXICET Take 1-2 tablets by mouth every 6 (six) hours as needed for severe pain.   pantoprazole 40 MG tablet Commonly known as:  PROTONIX Take 1 tablet (40 mg total) by mouth daily.   prochlorperazine 5 MG tablet Commonly known as:  COMPAZINE Take 1 tablet (5 mg total) by mouth every 6 (six) hours as needed for nausea or vomiting.       Allergies  Allergen Reactions  .  Daypro [Oxaprozin]   . Erythromycin   . Talwin [Pentazocine]     Consultations: Oncology.   Procedures/Studies: Dg Chest 1 View  Result Date: 09/15/2017 CLINICAL DATA:  Status post right-sided thoracentesis. Metastatic colon cancer. EXAM: CHEST  1 VIEW COMPARISON:  CT chest 09/12/2017 FINDINGS: The heart size is normal. The right pleural effusion is markedly decreased following thoracentesis. There is no pneumothorax. A smaller left pleural effusion is stable. Linear atelectasis is present in the right lower lobe. Surgical clips are present at the  gallbladder fossa. Surgical clips are present in the left axilla. Moderate degenerative changes are noted at the shoulders bilaterally. IMPRESSION: 1. Marked decrease in right pleural effusion following thoracentesis. No pneumothorax. 2. Residual small left pleural effusion. Electronically Signed   By: San Morelle M.D.   On: 09/15/2017 16:04   Dg Chest 2 View  Result Date: 10/09/2017 CLINICAL DATA:  Follow-up pleural effusion, shortness of breath. EXAM: CHEST - 2 VIEW COMPARISON:  10/06/2017. FINDINGS: The heart size and mediastinal contours are within normal limits. Small BILATERAL pleural effusions with basilar subsegmental atelectasis appear unchanged from radiograph of 10/06/2017. No osseous findings other than osteopenia. Port-A-Cath good position. IMPRESSION: Stable chest. Electronically Signed   By: Staci Righter M.D.   On: 10/09/2017 14:21   Dg Chest 2 View  Result Date: 10/06/2017 CLINICAL DATA:  Shortness of breath, cough. EXAM: CHEST - 2 VIEW COMPARISON:  Radiograph of September 15, 2017. FINDINGS: The heart size and mediastinal contours are within normal limits. Atherosclerosis of thoracic aorta is noted. Mild bilateral pleural effusions are noted with associated subsegmental atelectasis. The visualized skeletal structures are unremarkable. IMPRESSION: Mild bilateral pleural effusions with associated subsegmental atelectasis. Aortic Atherosclerosis (ICD10-I70.0). Electronically Signed   By: Marijo Conception, M.D.   On: 10/06/2017 17:26   Dg Abd 1 View  Result Date: 10/13/2017 CLINICAL DATA:  84 year old female with metastatic colon cancer. Possible small bowel obstruction. EXAM: ABDOMEN - 1 VIEW COMPARISON:  10/12/2017 and earlier. FINDINGS: Portable AP supine view at 0529 hours. NG tube has been removed. Stable cholecystectomy clips. Stable midline pelvic surgical clips and staple line. Paucity of small bowel gas. The terminal ileum appears normal. No dilated small bowel loops are evident.  Multifocal mildly gas distended large bowel is unchanged since the CT on 10/11/2017. No definite pneumoperitoneum on this supine view. Excreted IV contrast in the urinary bladder. Osteopenia with bilateral hip arthroplasty. No acute osseous abnormality identified. IMPRESSION: 1. Enteric tube removed. 2. Decreased small bowel gas since yesterday. No dilated small bowel loops are evident. Unchanged gas-filled colon, perhaps colonic ileus. Electronically Signed   By: Genevie Ann M.D.   On: 10/13/2017 07:48   Dg Abd 1 View  Result Date: 10/12/2017 CLINICAL DATA:  Small bowel obstruction EXAM: ABDOMEN - 1 VIEW COMPARISON:  Study obtained earlier in the day FINDINGS: Nasogastric tube tip is at the level of the first portion of the duodenum with the side port near the pylorus. There is slightly less bowel dilatation compared to earlier in the day. No air-fluid levels. No free air. There are surgical clips in the pelvis and right upper quadrant. There are total hip replacements bilaterally. Contrast is seen in the urinary bladder. IMPRESSION: Nasogastric tube tip in proximal duodenum with side port near the pylorus. There remain loops of mildly dilated bowel, slightly less than earlier in the day. No free air. Electronically Signed   By: Lowella Grip III M.D.   On: 10/12/2017 12:49   Dg  Abdomen 1 View  Result Date: 10/12/2017 CLINICAL DATA:  NG tube placement. EXAM: ABDOMEN - 1 VIEW COMPARISON:  CT yesterday. FINDINGS: Tip and side port of the enteric tube below the diaphragm in the stomach. Gaseous distention of small bowel is similar to prior CT. Excreted IV contrast in the urinary bladder. Cholecystectomy clips in the right upper quadrant. IMPRESSION: Tip and side port of the enteric tube below the diaphragm in the stomach. Electronically Signed   By: Jeb Levering M.D.   On: 10/12/2017 02:59   Ct Abdomen Pelvis W Contrast  Result Date: 10/11/2017 CLINICAL DATA:  Abdominal pain for 1 week, weakness since  March. Status post paracentesis October 09, 2016. History of stage IV colon cancer, constipation, breast cancer, cholecystectomy, appendectomy and hysterectomy. EXAM: CT ABDOMEN AND PELVIS WITH CONTRAST TECHNIQUE: Multidetector CT imaging of the abdomen and pelvis was performed using the standard protocol following bolus administration of intravenous contrast. CONTRAST:  48mL ISOVUE-300 IOPAMIDOL (ISOVUE-300) INJECTION 61% COMPARISON:  CT abdomen and pelvis September 12, 2017 FINDINGS: LOWER CHEST: Moderate to large RIGHT and moderate LEFT pleural effusions are increased from prior CT. Tiny stable nodules RIGHT lung. Heart size is normal. Mild coronary artery calcifications. Superior vena cava catheter. HEPATOBILIARY: Numerous hepatic metastasis, some of which are new from prior CT with interval growth of previous existing metastasis, largest is 2.5 cm. Mildly nodular liver contour consistent with cirrhosis. Patent portal vein. Status post cholecystectomy. PANCREAS: Normal. SPLEEN: Normal. ADRENALS/URINARY TRACT: Kidneys are orthotopic, delayed RIGHT renal enhancement with chronic RIGHT severe hydroureteronephrosis, transition point in RIGHT pelvis. STOMACH/BOWEL: Rectosigmoid bowel anastomosis. 7.4 cm stool distended rectum. Multiple loops of fluid distended small bowel measuring to 3.7 cm with air-fluid levels, transition point in the pelvis associated with adhesions. VASCULAR/LYMPHATIC: Aortoiliac vessels are normal in course and caliber. Mild calcific atherosclerosis. Worsening portal caval and porta hepatis lymphadenopathy now measuring to 18 mm short axis. REPRODUCTIVE: Status post hysterectomy. OTHER: Similar presacral 3.3 x 3.4 cm gas and fluid collection with tenting of adjacent bowel loops. Moderate volume ascites with mild peritoneal wall thickening consistent with metastasis. MUSCULOSKELETAL: Mild periosteal reaction of anterior sacral coccyx junction, similar. Punctate calcification and postsurgical changes  LEFT breast. Streak artifact from bilateral hip total arthroplasties. Osteopenia. Moderate spondylosis. IMPRESSION: 1. Low-grade small bowel obstruction, transition point in the pelvis. Scattered small and large bowel air-fluid levels suggesting enteritis/ileus. Stool distended rectum. 2. Metastatic disease progression. Worsening hepatic metastasis and new perihepatic lymphadenopathy. 3. Moderate volume ascites. Similar presacral fluid collection with gas concerning for fistula. 4. Chronic severe RIGHT hydronephrosis. 5. Increasing pleural effusions are likely malignant. Aortic Atherosclerosis (ICD10-I70.0). Electronically Signed   By: Elon Alas M.D.   On: 10/11/2017 21:53   US Paracentesis  Result Date: 10/09/2017 INDICATION: Abdominal distention. Ascites. Request for diagnostic and therapeutic paracentesis up to 5 L max. EXAM: ULTRASOUND GUIDED LEFT LOWER QUADRANT PARACENTESIS MEDICATIONS: None. COMPLICATIONS: None immediate. PROCEDURE: Informed written consent was obtained from the patient after a discussion of the risks, benefits and alternatives to treatment. A timeout was performed prior to the initiation of the procedure. Initial ultrasound scanning demonstrates a large amount of ascites within the left lower abdominal quadrant. The left lower abdomen was prepped and draped in the usual sterile fashion. 1% lidocaine with epinephrine was used for local anesthesia. Following this, a 6 Fr Safe-T-Centesis catheter was introduced. An ultrasound image was saved for documentation purposes. The paracentesis was performed. The catheter was removed and a dressing was applied. The patient tolerated the  procedure well without immediate post procedural complication. FINDINGS: A total of approximately 4.1 L of clear yellow fluid was removed. Samples were sent to the laboratory as requested by the clinical team. IMPRESSION: Successful ultrasound-guided paracentesis yielding 4.1 liters of peritoneal fluid. Read  by: Ascencion Dike PA-C Electronically Signed   By: Markus Daft M.D.   On: 10/09/2017 16:56   Ir US Guide Vasc Access Right  Result Date: 10/06/2017 CLINICAL DATA:  Colon cancer EXAM: TUNNEL POWER PORT PLACEMENT WITH SUBCUTANEOUS POCKET UTILIZING ULTRASOUND & FLOUROSCOPY FLUOROSCOPY TIME:  48 seconds.  2.3 mGy. MEDICATIONS AND MEDICAL HISTORY: Versed 1.5 mg, Fentanyl 100 mcg. Additional Medications: Ancef 2 g. Antibiotics were given within 2 hours of the procedure. ANESTHESIA/SEDATION: Moderate sedation time: 23 minutes. Nursing monitored the the patient during the procedure. PROCEDURE: After written informed consent was obtained, patient was placed in the supine position on angiographic table. The right neck and chest was prepped and draped in a sterile fashion. Lidocaine was utilized for local anesthesia. The right jugular vein was noted to be patent initially with ultrasound. Under sonographic guidance, a micropuncture needle was inserted into the right IJ vein (Ultrasound and fluoroscopic image documentation was performed). The needle was removed over an 018 wire which was exchanged for a Amplatz. This was advanced into the IVC. An 8-French dilator was advanced over the Amplatz. A small incision was made in the right upper chest over the anterior right second rib. Utilizing blunt dissection, a subcutaneous pocket was created in the caudal direction. The pocket was irrigated with a copious amount of sterile normal saline. The port catheter was tunneled from the chest incision, and out the neck incision. The reservoir was inserted into the subcutaneous pocket and secured with two 3-0 Ethilon stitches. A peel-away sheath was advanced over the Amplatz wire. The port catheter was cut to measure length and inserted through the peel-away sheath. The peel-away sheath was removed. The chest incision was closed with 3-0 Vicryl interrupted stitches for the subcutaneous tissue and a running of 4-0 Vicryl subcuticular  stitch for the skin. The neck incision was closed with a 4-0 Vicryl subcuticular stitch. Derma-bond was applied to both surgical incisions. The port reservoir was flushed and instilled with heparinized saline. No complications. FINDINGS: A right IJ vein Port-A-Cath is in place with its tip at the cavoatrial junction. COMPLICATIONS: None IMPRESSION: Successful 8 French right internal jugular vein power port placement with its tip at the SVC/RA junction. Electronically Signed   By: Marybelle Killings M.D.   On: 10/06/2017 15:41   Dg Abd 2 Views  Result Date: 10/09/2017 CLINICAL DATA:  84 year old female with metastatic colon cancer. History of breast cancer. Cramping and abdominal pain with constipation. Initial encounter. EXAM: ABDOMEN - 2 VIEW COMPARISON:  Chest x-ray same date dictated separately. 09/12/2017 CT abdomen and pelvis. FINDINGS: Gas-filled prominent size transverse colon measuring up to 6.7 cm. Stool within ascending colon, descending colon and rectum. Gas-filled small bowel loops central lower abdomen/pelvis may represent sigmoid colon rather than gas distended small bowel loops. Ascites. Bilateral pleural effusions. No free intraperitoneal air (on accompanying chest x-ray). Scoliosis lumbar spine convex right. Bilateral hip replacements. Scattered surgical clips and staples. IMPRESSION: Nonspecific bowel gas pattern without plain film evidence of bowel obstruction. Ascites and bilateral pleural effusions. Electronically Signed   By: Genia Del M.D.   On: 10/09/2017 14:12   Ir Fluoro Guide Port Insertion Right  Result Date: 10/06/2017 CLINICAL DATA:  Colon cancer EXAM: TUNNEL POWER PORT PLACEMENT  WITH SUBCUTANEOUS POCKET UTILIZING ULTRASOUND & FLOUROSCOPY FLUOROSCOPY TIME:  48 seconds.  2.3 mGy. MEDICATIONS AND MEDICAL HISTORY: Versed 1.5 mg, Fentanyl 100 mcg. Additional Medications: Ancef 2 g. Antibiotics were given within 2 hours of the procedure. ANESTHESIA/SEDATION: Moderate sedation time: 23  minutes. Nursing monitored the the patient during the procedure. PROCEDURE: After written informed consent was obtained, patient was placed in the supine position on angiographic table. The right neck and chest was prepped and draped in a sterile fashion. Lidocaine was utilized for local anesthesia. The right jugular vein was noted to be patent initially with ultrasound. Under sonographic guidance, a micropuncture needle was inserted into the right IJ vein (Ultrasound and fluoroscopic image documentation was performed). The needle was removed over an 018 wire which was exchanged for a Amplatz. This was advanced into the IVC. An 8-French dilator was advanced over the Amplatz. A small incision was made in the right upper chest over the anterior right second rib. Utilizing blunt dissection, a subcutaneous pocket was created in the caudal direction. The pocket was irrigated with a copious amount of sterile normal saline. The port catheter was tunneled from the chest incision, and out the neck incision. The reservoir was inserted into the subcutaneous pocket and secured with two 3-0 Ethilon stitches. A peel-away sheath was advanced over the Amplatz wire. The port catheter was cut to measure length and inserted through the peel-away sheath. The peel-away sheath was removed. The chest incision was closed with 3-0 Vicryl interrupted stitches for the subcutaneous tissue and a running of 4-0 Vicryl subcuticular stitch for the skin. The neck incision was closed with a 4-0 Vicryl subcuticular stitch. Derma-bond was applied to both surgical incisions. The port reservoir was flushed and instilled with heparinized saline. No complications. FINDINGS: A right IJ vein Port-A-Cath is in place with its tip at the cavoatrial junction. COMPLICATIONS: None IMPRESSION: Successful 8 French right internal jugular vein power port placement with its tip at the SVC/RA junction. Electronically Signed   By: Marybelle Killings M.D.   On: 10/06/2017  15:41   US Thoracentesis Asp Pleural Space W/img Guide  Result Date: 09/15/2017 INDICATION: Symptomatic right sided pleural effusion EXAM: US THORACENTESIS ASP PLEURAL SPACE W/IMG GUIDE COMPARISON:  None. MEDICATIONS: 10 cc 1% lidocaine. COMPLICATIONS: None immediate. TECHNIQUE: Informed written consent was obtained from the patient after a discussion of the risks, benefits and alternatives to treatment. A timeout was performed prior to the initiation of the procedure. Initial ultrasound scanning demonstrates right pleural effusion. The lower chest was prepped and draped in the usual sterile fashion. 1% lidocaine was used for local anesthesia. Under direct ultrasound guidance, a 19 gauge, 7-cm, Yueh catheter was introduced. An ultrasound image was saved for documentation purposes. the thoracentesis was performed. The catheter was removed and a dressing was applied. The patient tolerated the procedure well without immediate post procedural complication. The patient was escorted to have an upright chest radiograph. FINDINGS: A total of approximately 1 liters of yellow fluid was removed. Requested samples were sent to the laboratory. IMPRESSION: Successful ultrasound-guided right sided thoracentesis yielding 1 liters of pleural fluid. Read by Lavonia Drafts [facility] Electronically Signed   By: Aletta Edouard M.D.   On: 09/15/2017 16:07       Subjective: No new complaints.   Discharge Exam: Vitals:   10/14/17 0522 10/14/17 1221  BP: 132/80 131/77  Pulse: 80 92  Resp: 16   Temp: 97.9 F (36.6 C) 97.8 F (36.6 C)  SpO2: 95% 95%  Vitals:   10/13/17 1453 10/13/17 2004 10/14/17 0522 10/14/17 1221  BP: 111/67 140/82 132/80 131/77  Pulse: (!) 113 90 80 92  Resp: 20 20 16    Temp: 97.6 F (36.4 C) 98.4 F (36.9 C) 97.9 F (36.6 C) 97.8 F (36.6 C)  TempSrc: Oral Oral Oral Oral  SpO2: 100% 94% 95% 95%  Weight:      Height:        General: Pt is alert, awake, not in acute  distress Cardiovascular: RRR, S1/S2 +, no rubs, no gallops Respiratory: CTA bilaterally, no wheezing, no rhonchi Abdominal: Soft, NT, ND, bowel sounds + Extremities: no edema, no cyanosis    The results of significant diagnostics from this hospitalization (including imaging, microbiology, ancillary and laboratory) are listed below for reference.     Microbiology: No results found for this or any previous visit (from the past 240 hour(s)).   Labs: BNP (last 3 results) No results for input(s): BNP in the last 8760 hours. Basic Metabolic Panel: Recent Labs  Lab 10/09/17 1248 10/11/17 1919 10/12/17 0525  NA 137 135 137  K 4.3 4.5 4.0  CL 105 102 106  CO2 23 23 20*  GLUCOSE 111 123* 114*  BUN 27* 30* 25*  CREATININE 1.30* 1.22* 1.04*  CALCIUM 9.9 9.3 8.7*  MG  --   --  1.8   Liver Function Tests: Recent Labs  Lab 10/09/17 1248 10/11/17 1919  AST 21 23  ALT <6 9*  ALKPHOS 59 52  BILITOT 0.6 0.9  PROT 6.2* 5.8*  ALBUMIN 2.6* 2.5*   No results for input(s): LIPASE, AMYLASE in the last 168 hours. No results for input(s): AMMONIA in the last 168 hours. CBC: Recent Labs  Lab 10/09/17 1248 10/11/17 1919 10/12/17 0525  WBC 8.2 9.6 8.8  NEUTROABS 6.7* 7.8*  --   HGB  --  11.6* 10.7*  HCT 35.3 35.5* 32.9*  MCV 91.7 91.5 92.4  PLT 271 252 243   Cardiac Enzymes: No results for input(s): CKTOTAL, CKMB, CKMBINDEX, TROPONINI in the last 168 hours. BNP: Invalid input(s): POCBNP CBG: Recent Labs  Lab 10/13/17 1445  GLUCAP 118*   D-Dimer No results for input(s): DDIMER in the last 72 hours. Hgb A1c No results for input(s): HGBA1C in the last 72 hours. Lipid Profile No results for input(s): CHOL, HDL, LDLCALC, TRIG, CHOLHDL, LDLDIRECT in the last 72 hours. Thyroid function studies No results for input(s): TSH, T4TOTAL, T3FREE, THYROIDAB in the last 72 hours.  Invalid input(s): FREET3 Anemia work up No results for input(s): VITAMINB12, FOLATE, FERRITIN, TIBC,  IRON, RETICCTPCT in the last 72 hours. Urinalysis    Component Value Date/Time   APPEARANCEUR Clear 11/21/2012 1537   GLUCOSEU Negative 11/21/2012 1537   BILIRUBINUR neg 09/14/2017 1502   BILIRUBINUR Negative 11/21/2012 1537   PROTEINUR 30+ 09/14/2017 1502   PROTEINUR Negative 11/21/2012 1537   UROBILINOGEN 0.2 09/14/2017 1502   NITRITE positive 09/14/2017 1502   NITRITE Negative 11/21/2012 1537   LEUKOCYTESUR Moderate (2+) (A) 09/14/2017 1502   LEUKOCYTESUR Negative 11/21/2012 1537   Sepsis Labs Invalid input(s): PROCALCITONIN,  WBC,  LACTICIDVEN Microbiology No results found for this or any previous visit (from the past 240 hour(s)).   Time coordinating discharge: 32 minutes.    SIGNED:   Hosie Poisson, MD  [facility] 10/15/2017, 8:48 AM Pager   If 7PM-7AM, please contact night-coverage [email or web address] Password [email or web address]

## 2017-10-16 ENCOUNTER — Other Ambulatory Visit: Payer: Medicare HMO

## 2017-10-16 ENCOUNTER — Telehealth: Payer: Self-pay

## 2017-10-16 ENCOUNTER — Ambulatory Visit: Payer: Medicare HMO | Admitting: Oncology

## 2017-10-16 NOTE — Telephone Encounter (Signed)
Called patient to do toc call and schedule toc visit. Patient told me to call her sister for that information. Her sister did not answer-I left a voicemail and will try again later

## 2017-10-16 NOTE — Telephone Encounter (Signed)
Transition Care Management Follow-Up Telephone Call   Date discharged and where: [facility] on 10/14/2017  How have you been since you were released from the hospital? On liquid diet and not eating much.  No dizziness however she is not getting up and walking  Any patient concerns? None  Items Reviewed:   Meds: Y  Allergies: Y  Dietary Changes Reviewed: Y  Functional Questionnaire:  Independent-I Dependent-D  ADLs:   Dressing- I    Eating- I   Maintaining continence- Some leaking of bowels   Transferring- I w/ cane   Transportation- D   Meal Prep- I w/ assist   Managing Meds- I w/ ssist  Confirmed importance and Date/Time of follow-up visits scheduled: Originally when I called the patient she said her sister would be the one to answer questions and make appointments. Patient sister said I would have to ask the patient if she wants a follow up appointment with Janett Billow. When I went to go call Veena I got her voicemail and left a message.   Confirmed with patient if condition worsens to call PCP or go to the Emergency Dept. Patient was given office number and encouraged to call back with questions or concerns: Yes

## 2017-10-17 ENCOUNTER — Ambulatory Visit: Payer: Medicare HMO

## 2017-10-18 ENCOUNTER — Telehealth: Payer: Self-pay

## 2017-10-18 NOTE — Telephone Encounter (Signed)
Call from [place] with [facility] requesting order for 2L oxygen as needed. Ok per MD. Almyra Free will fax order to our office. Almyra Free states that the pt also wants to know if another paracentesis will be needed. Will consult MD.

## 2017-10-18 NOTE — Telephone Encounter (Signed)
Ok for prn paracentesis

## 2017-10-19 NOTE — Telephone Encounter (Signed)
Called patient again to reschedule appt. Patient husband states that they will call back after he speaks with Dr. Benay Spice

## 2017-10-22 ENCOUNTER — Other Ambulatory Visit: Payer: Self-pay | Admitting: Oncology

## 2017-10-23 ENCOUNTER — Telehealth: Payer: Self-pay

## 2017-10-23 NOTE — Telephone Encounter (Signed)
Returned call to pt son regarding need for paracentesis. Per son Pt to report to appt tomorrow and will discuss with MD at visit. This Rn voiced understanding.

## 2017-10-24 ENCOUNTER — Inpatient Hospital Stay (HOSPITAL_BASED_OUTPATIENT_CLINIC_OR_DEPARTMENT_OTHER): Payer: Medicare HMO | Admitting: Oncology

## 2017-10-24 ENCOUNTER — Ambulatory Visit (HOSPITAL_COMMUNITY)
Admission: RE | Admit: 2017-10-24 | Discharge: 2017-10-24 | Disposition: A | Discharge status: Home / Self Care | Source: Ambulatory Visit | Attending: Radiology | Admitting: Radiology

## 2017-10-24 ENCOUNTER — Ambulatory Visit (HOSPITAL_COMMUNITY)
Admission: RE | Admit: 2017-10-24 | Discharge: 2017-10-24 | Disposition: A | Discharge status: Home / Self Care | Source: Ambulatory Visit | Attending: Nurse Practitioner | Admitting: Nurse Practitioner

## 2017-10-24 ENCOUNTER — Other Ambulatory Visit: Payer: Self-pay | Admitting: Oncology

## 2017-10-24 ENCOUNTER — Other Ambulatory Visit: Payer: Medicare HMO

## 2017-10-24 ENCOUNTER — Ambulatory Visit: Payer: Medicare HMO

## 2017-10-24 ENCOUNTER — Telehealth: Payer: Self-pay | Admitting: Oncology

## 2017-10-24 VITALS — BP 109/64 | HR 113 | Temp 97.6°F | Resp 17 | Ht 65.0 in | Wt 132.0 lb

## 2017-10-24 DIAGNOSIS — C187 Malignant neoplasm of sigmoid colon: Secondary | ICD-10-CM

## 2017-10-24 DIAGNOSIS — C189 Malignant neoplasm of colon, unspecified: Secondary | ICD-10-CM

## 2017-10-24 DIAGNOSIS — I10 Essential (primary) hypertension: Secondary | ICD-10-CM | POA: Diagnosis not present

## 2017-10-24 DIAGNOSIS — J90 Pleural effusion, not elsewhere classified: Secondary | ICD-10-CM

## 2017-10-24 DIAGNOSIS — K5900 Constipation, unspecified: Secondary | ICD-10-CM

## 2017-10-24 DIAGNOSIS — Z9889 Other specified postprocedural states: Secondary | ICD-10-CM

## 2017-10-24 DIAGNOSIS — R188 Other ascites: Secondary | ICD-10-CM | POA: Diagnosis not present

## 2017-10-24 DIAGNOSIS — C786 Secondary malignant neoplasm of retroperitoneum and peritoneum: Secondary | ICD-10-CM | POA: Diagnosis not present

## 2017-10-24 DIAGNOSIS — J910 Malignant pleural effusion: Secondary | ICD-10-CM | POA: Diagnosis not present

## 2017-10-24 DIAGNOSIS — J939 Pneumothorax, unspecified: Secondary | ICD-10-CM | POA: Diagnosis not present

## 2017-10-24 DIAGNOSIS — C787 Secondary malignant neoplasm of liver and intrahepatic bile duct: Secondary | ICD-10-CM | POA: Diagnosis not present

## 2017-10-24 MED ORDER — MORPHINE SULFATE (CONCENTRATE) 20 MG/ML PO SOLN: ORAL | 0 refills | Status: AC

## 2017-10-24 MED ORDER — LIDOCAINE HCL 1 % IJ SOLN
INTRAMUSCULAR | Status: AC
  Filled 2017-10-24: qty 20

## 2017-10-24 MED ORDER — MAGIC MOUTHWASH: 10.0000 mL | ORAL | 0 refills | Status: AC | PRN

## 2017-10-24 NOTE — Procedures (Addendum)
Ultrasound-guided  therapeutic right thoracentesis performed yielding 1.6 liters of yellow fluid. No immediate complications. Follow-up chest x-ray pending.  Patient originally presented for paracentesis, however there was only a small amount of ascites present. Findings were discussed with Dr. Benay Spice and order changed to thoracentesis.

## 2017-10-24 NOTE — Telephone Encounter (Signed)
Appointments scheduled AVS/Calendar printed per 4/23 los °

## 2017-10-24 NOTE — Progress Notes (Signed)
  [place] OFFICE PROGRESS NOTE   Diagnosis: Colon cancer  INTERVAL HISTORY:   Ms. Martinek returns as scheduled.  She was discharged from the hospital 10/14/2017 after admission with a small bowel obstruction.  She is enrolled in the [facility] hospice program.  She reports feeling "weak ".  She is having bowel movements.  She complains of abdominal distention and pain.  Oxycodone relieves the pain, but takes a long time to act.  She is here today with her sons.  She has shortness of breath. She discontinued anticoagulation therapy after an episode of rectal bleeding.  Objective:  Vital signs in last 24 hours:  Blood pressure 109/64, pulse (!) 113, temperature 97.6 F (36.4 C), temperature source Oral, resp. rate 17, height _0  (1.651 m), weight 132 lb (59.9 kg), SpO2 97 %.    HEENT: No thrush Resp: Clear bilaterally Cardio: Regular rate and rhythm GI: Distended Vascular: Trace edema at the left lower leg    Portacath/PICC-without erythema  Medications: I have reviewed the patient's current medications.   Assessment/Plan: 1. Metastatic colon cancer ? Sigmoid colectomy, right oophorectomy, omentectomy, and mesenteric implant biopsies at [facility] 09/30/2016 ? Sigmoid colon cancer (T4a,N1b,M1c), MSI-stable, mismatch repair protein expression intact ? Metastatic colon cancer involving the omentum, right ovary, mesenteric nodule biopsies\ ? CT chest/abdomen/pelvis 11/30/2016-no evidence of metastatic disease in the chest, abdomen, pelvis. 5 mm lymph node right of the IVC. Postsurgical fluid collection in the presacral space immediately adjacent to the rectal anastomosis with an air-fluid level. ? Foundation 1-no RASmutation; ERBB2 amplification; microsatellite stable; low tumor mutationalburden ? CTs 03/28/2017-new right pleural irregularity/nodularity, no other evidence of metastatic disease ? CTs 09/12/2017-progressive metastatic disease involving multiple liver  lesions, peritoneal tumor, ascites, omental caking in the left upper quadrant, large right pleural effusion, right hydronephrosis ? Right thoracentesis 09/15/2017-metastatic adenocarcinoma consistent with a colon primary ? CT abdomen/pelvis 10/11/2017- small bowel obstruction, progressive liver metastases, ascites, pleural effusions, right hydronephrosis   2. Left breast cancer 1994  3. Hypertension  4. Arthritis  5.Renal insufficiency-CT 09/12/2017 and 10/11/2017 with right hydronephrosis  6.Port-A-Cath placement 10/06/2017  7.  Admission 10/11/2017 with a bowel obstruction- clinical improvement with NGT decompression and laxatives, plain x-ray 10/13/2017-decreased small bowel gas, no dilated small bowel loops     Disposition: Ms. Mcshea has metastatic colon cancer.  She is symptomatic with pain secondary to carcinomatosis.  She has enrolled in the [facility] hospice program.  She will begin a trial of Roxanol for pain.  I think it is reasonable to discontinue any coagulation therapy given the poor prognosis.  She will be referred to interventional radiology for a palliative paracentesis.  We will asked the hospice RN to flush the Port-A-Cath at home.  Ms. Hypes will return for an office visit 11/09/2017.    Betsy Coder, MD  10/24/2017  10:00 AM

## 2017-10-26 ENCOUNTER — Telehealth: Payer: Self-pay

## 2017-10-26 NOTE — Telephone Encounter (Signed)
Spoke with [facility]. Per Almyra Free, pt is getting port flushed every 6 weeks. This RN voiced understanding/

## 2017-10-31 ENCOUNTER — Ambulatory Visit: Payer: Medicare HMO | Admitting: Nurse Practitioner

## 2017-11-09 ENCOUNTER — Ambulatory Visit: Payer: Medicare HMO | Admitting: Nurse Practitioner

## 2017-11-17 ENCOUNTER — Ambulatory Visit: Payer: Medicare HMO | Admitting: Cardiology

## 2017-12-02 DEATH — deceased

## 2017-12-18 ENCOUNTER — Ambulatory Visit: Payer: Medicare HMO | Admitting: Nurse Practitioner

## 2018-08-15 ENCOUNTER — Ambulatory Visit: Payer: Self-pay

## 2018-09-24 IMAGING — DX DG ABDOMEN 2V
2 series · 2 of 2 positions shown · non-contrast
Comparison: Chest x-ray same date dictated separately. 09/12/2017
CT abdomen and pelvis.

CLINICAL DATA: 84-year-old female with metastatic colon cancer.
History of breast cancer. Cramping and abdominal pain with
constipation. Initial encounter.

EXAM:
ABDOMEN - 2 VIEW

[abdomen erect]
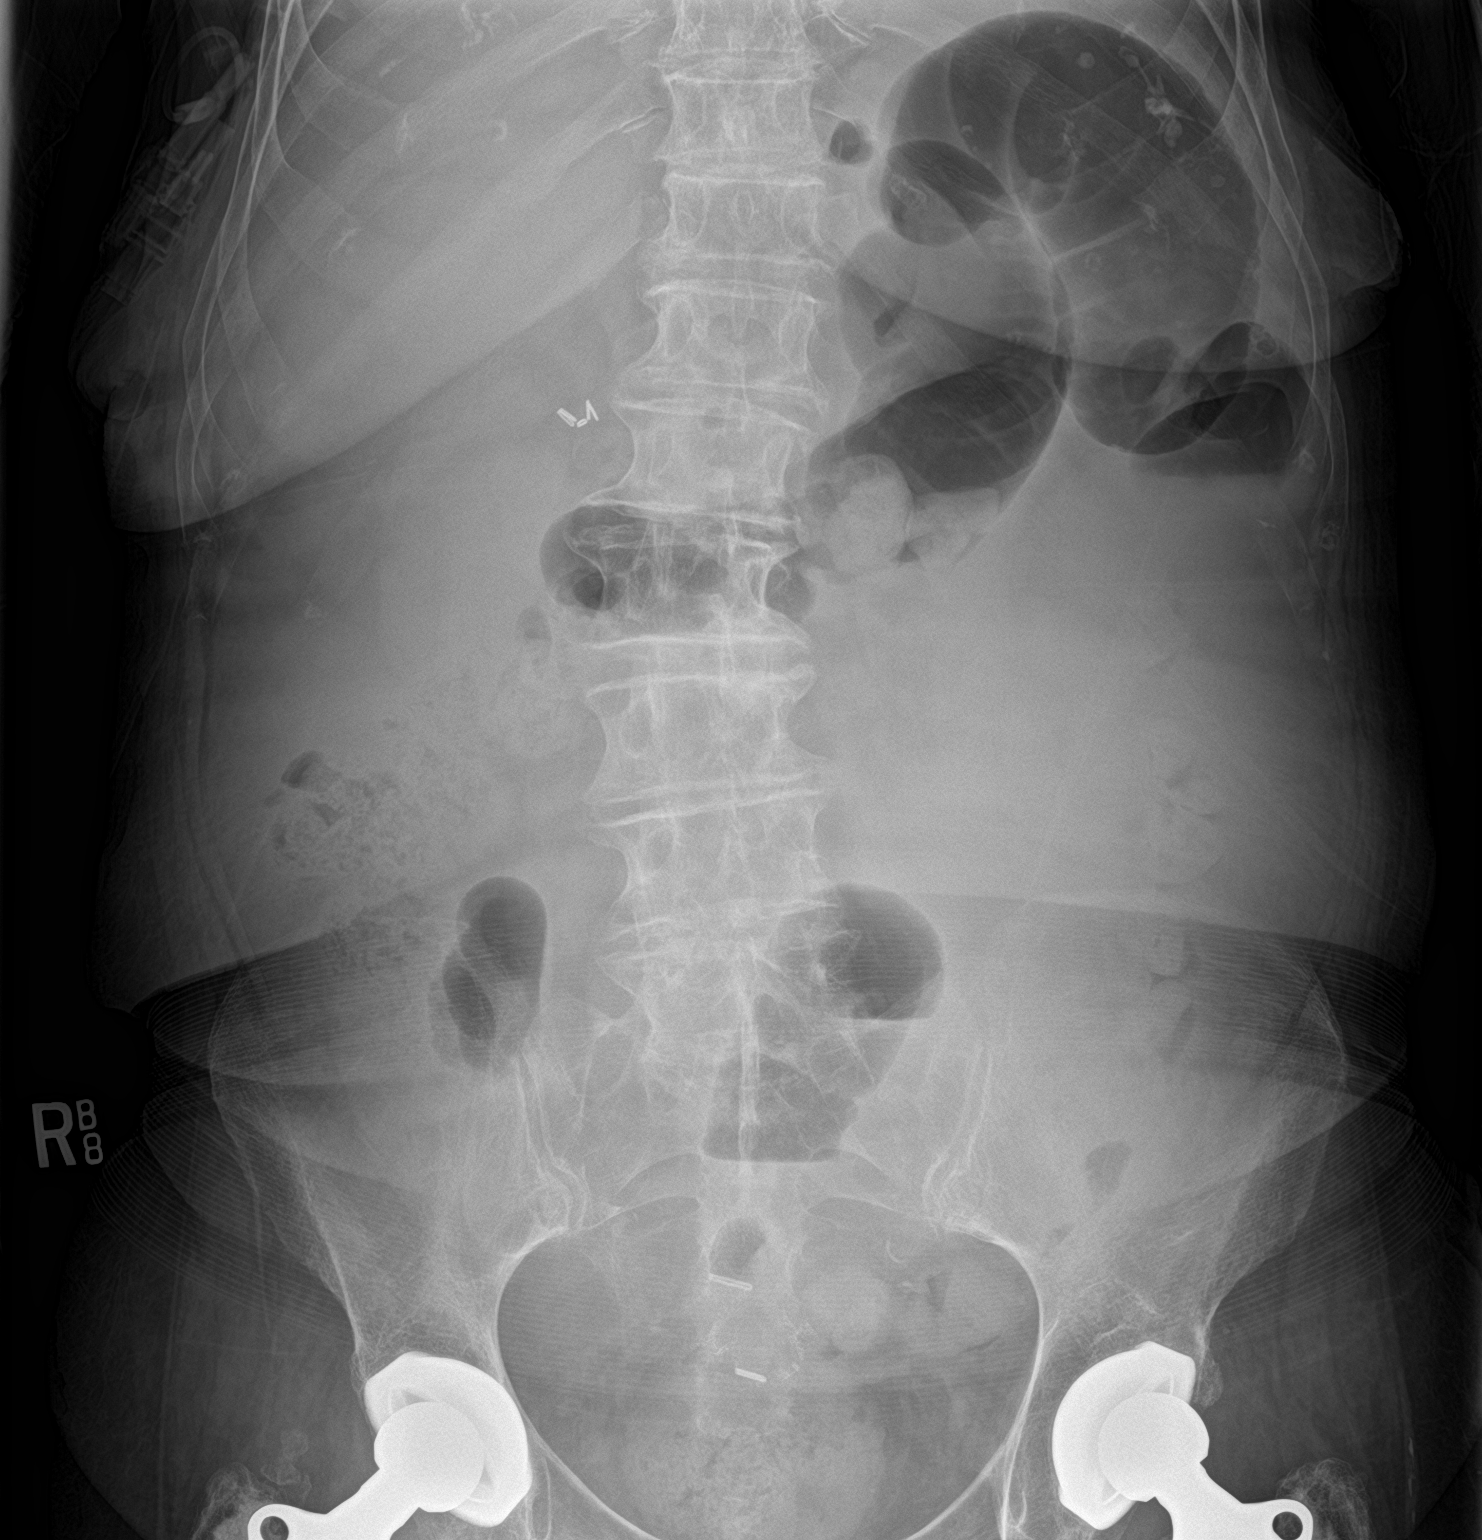

[abdomen supine]
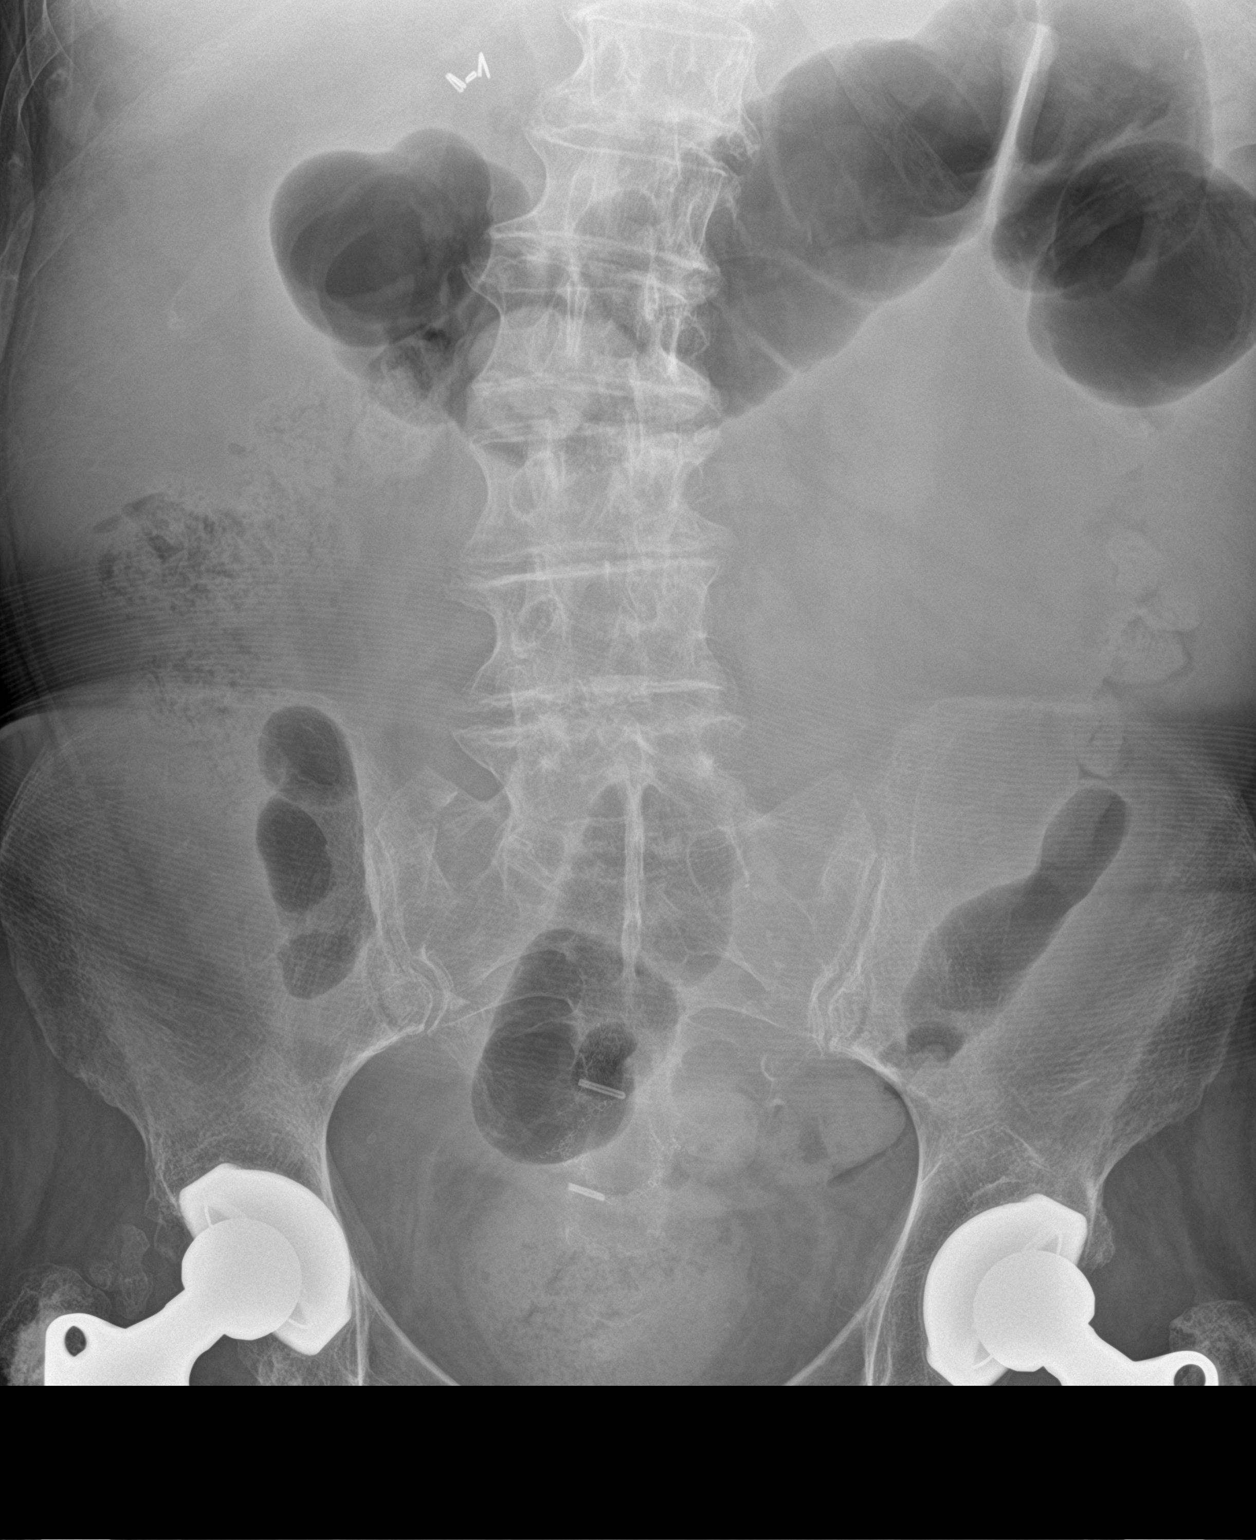

[2 of 2 positions shown; findings below may reference images not displayed]

FINDINGS: Gas-filled prominent size transverse colon measuring up to 6.7 cm.
Stool within ascending colon, descending colon and rectum.
Gas-filled small bowel loops central lower abdomen/pelvis may
represent sigmoid colon rather than gas distended small bowel loops.
Ascites. Bilateral pleural effusions. No free intraperitoneal air
(on accompanying chest x-ray).

Scoliosis lumbar spine convex right. Bilateral hip replacements.
Scattered surgical clips and staples.
IMPRESSION: Nonspecific bowel gas pattern without plain film evidence of bowel
obstruction.

Ascites and bilateral pleural effusions.

## 2018-09-24 IMAGING — DX DG CHEST 2V
2 series · 2 of 2 positions shown · non-contrast
Comparison: 10/06/2017.

CLINICAL DATA: Follow-up pleural effusion, shortness of breath.

EXAM:
CHEST - 2 VIEW

[chest pa]
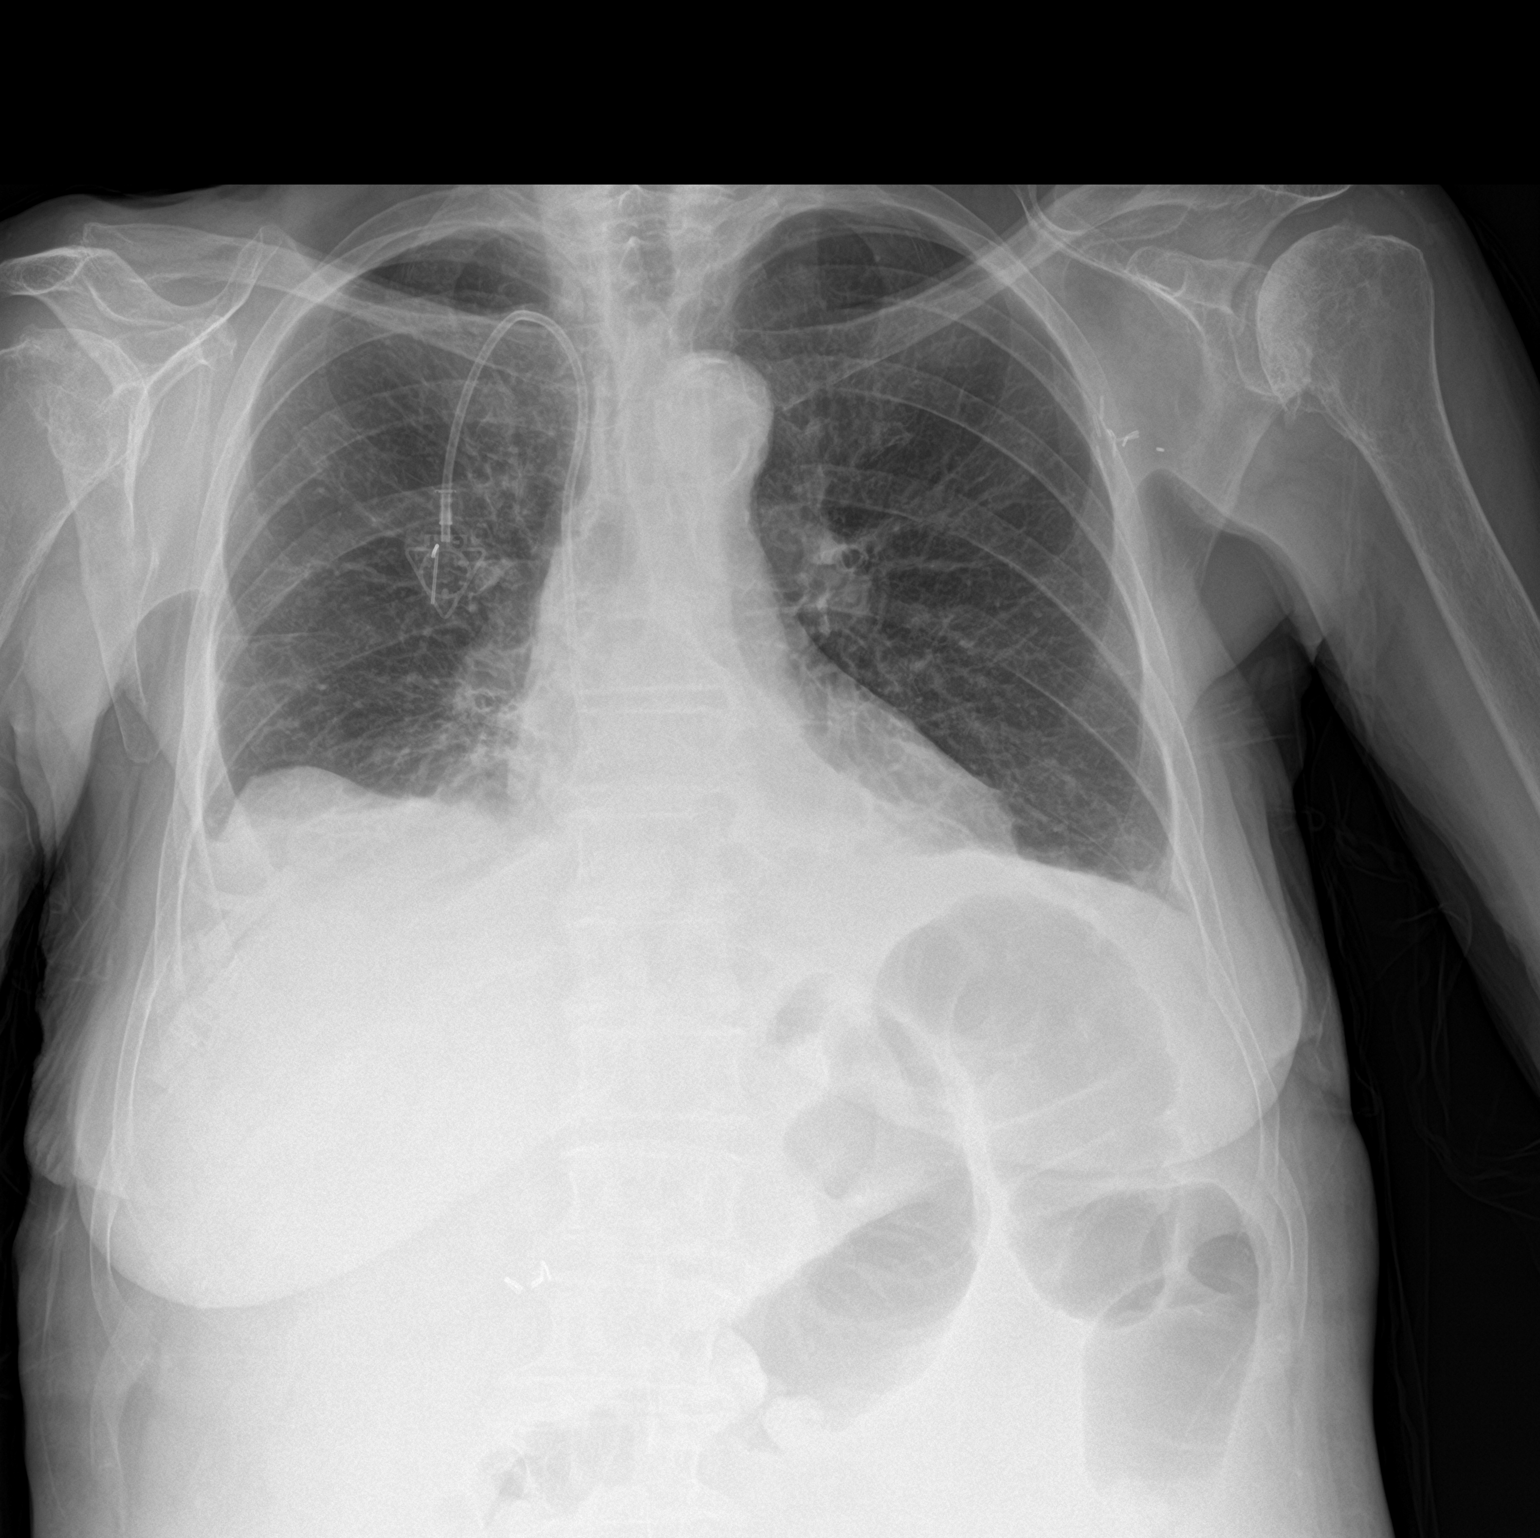

[chest lat]
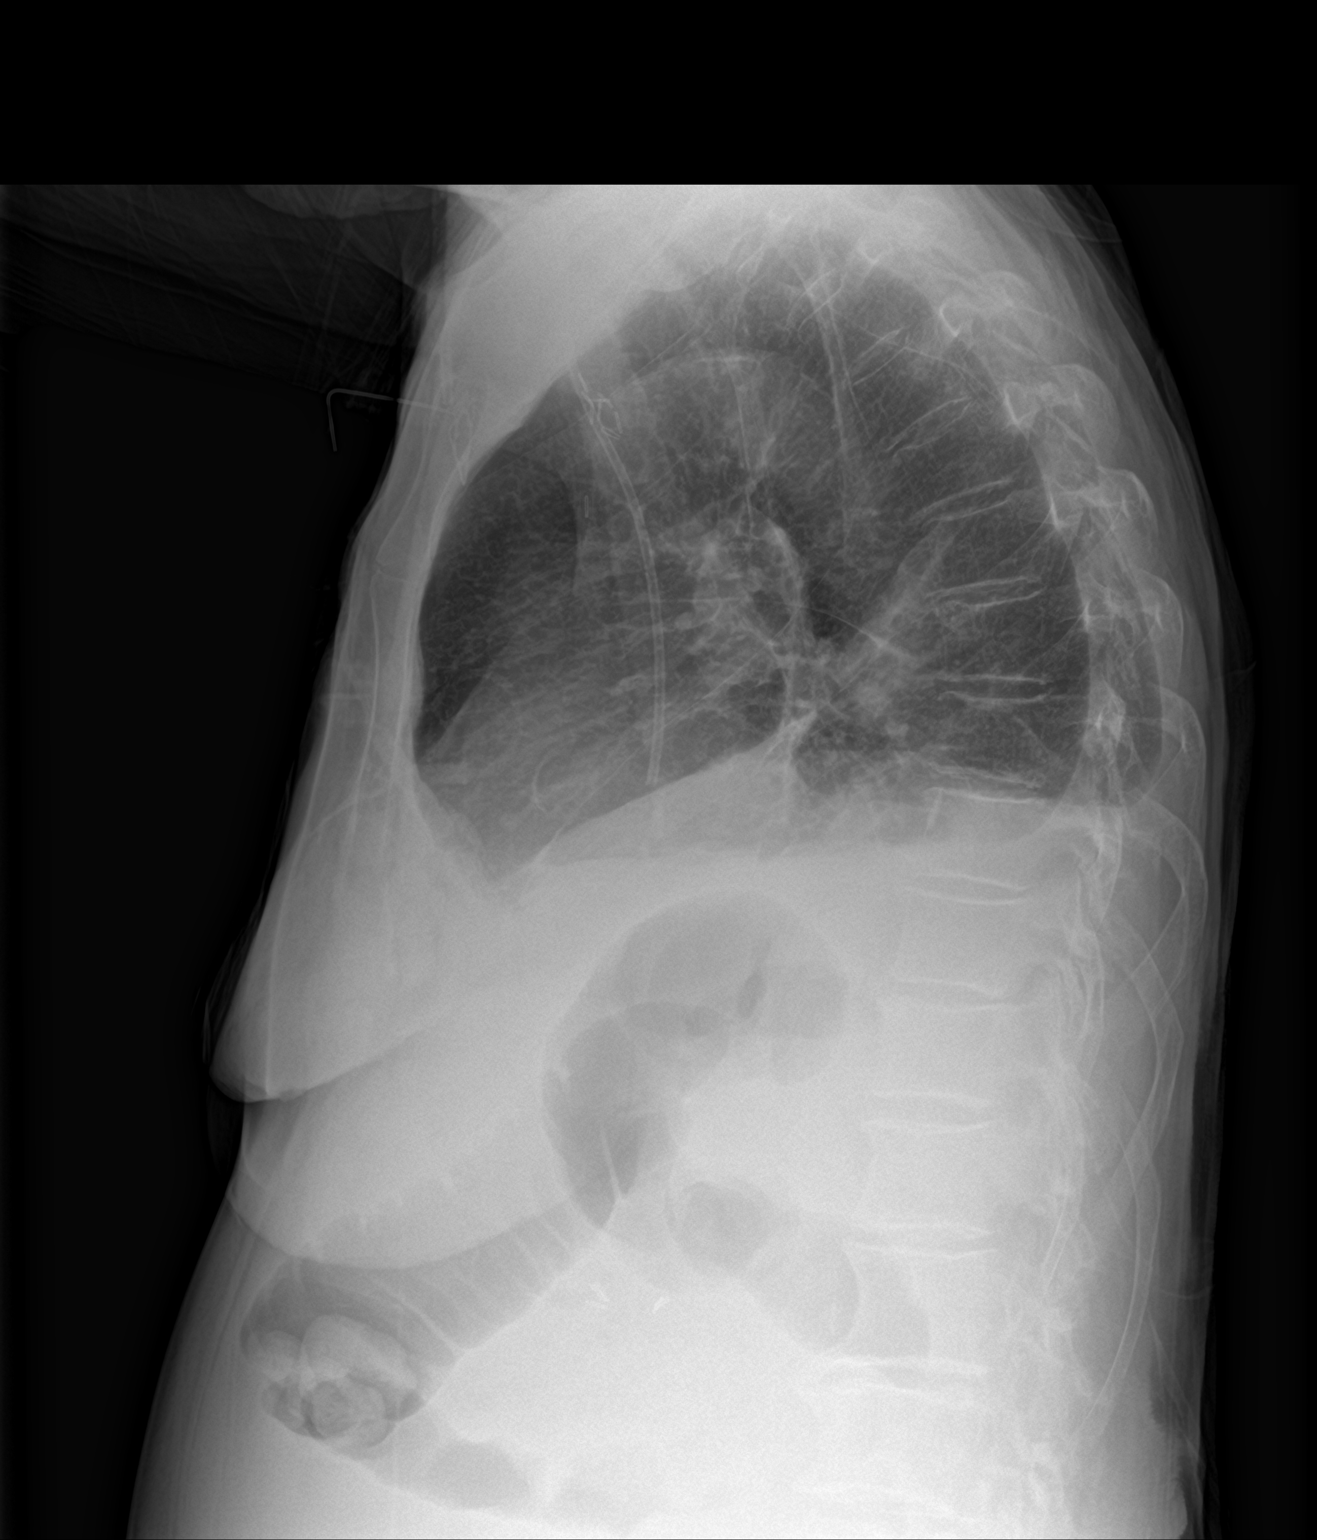

[2 of 2 positions shown; findings below may reference images not displayed]

FINDINGS: The heart size and mediastinal contours are within normal limits.
Small BILATERAL pleural effusions with basilar subsegmental
atelectasis appear unchanged from radiograph of 10/06/2017. No
osseous findings other than osteopenia. Port-A-Cath good position.
IMPRESSION: Stable chest.

## 2018-09-24 IMAGING — US US PARACENTESIS
1 series · 5 of 5 positions shown · non-contrast
Comparison: none

INDICATION: Abdominal distention. Ascites. Request for diagnostic and
therapeutic paracentesis up to 5 L max.

[Series 1: us paracentesis · 5 of 5 slices shown]
[im 1/5]
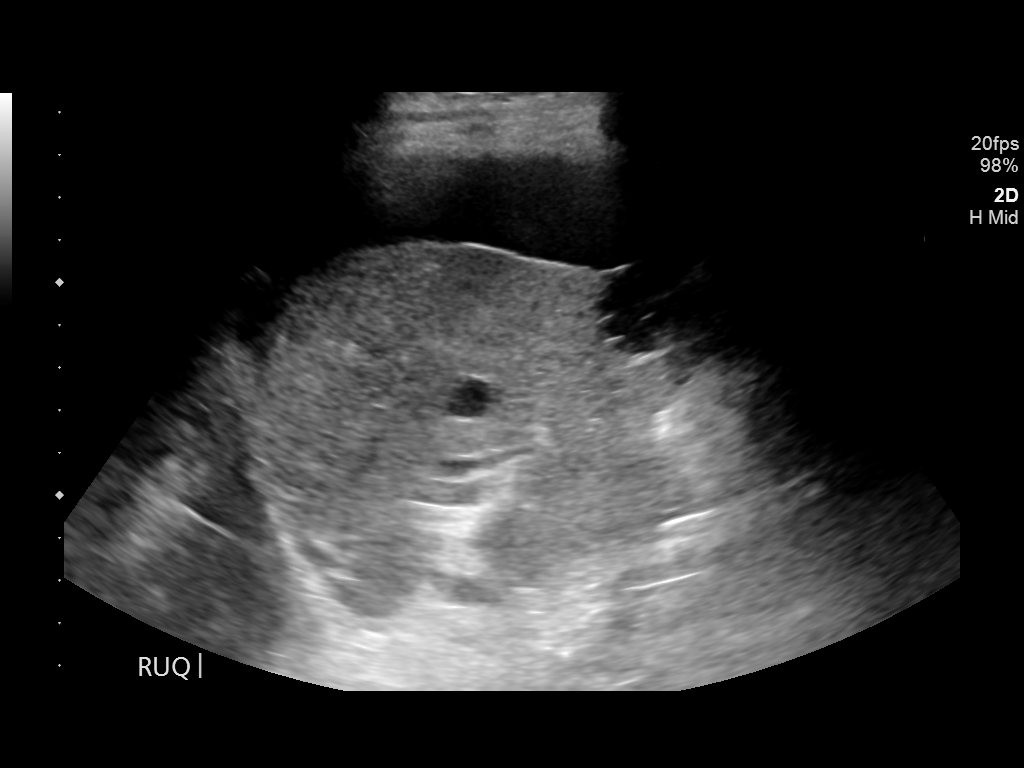
[im 2/5]
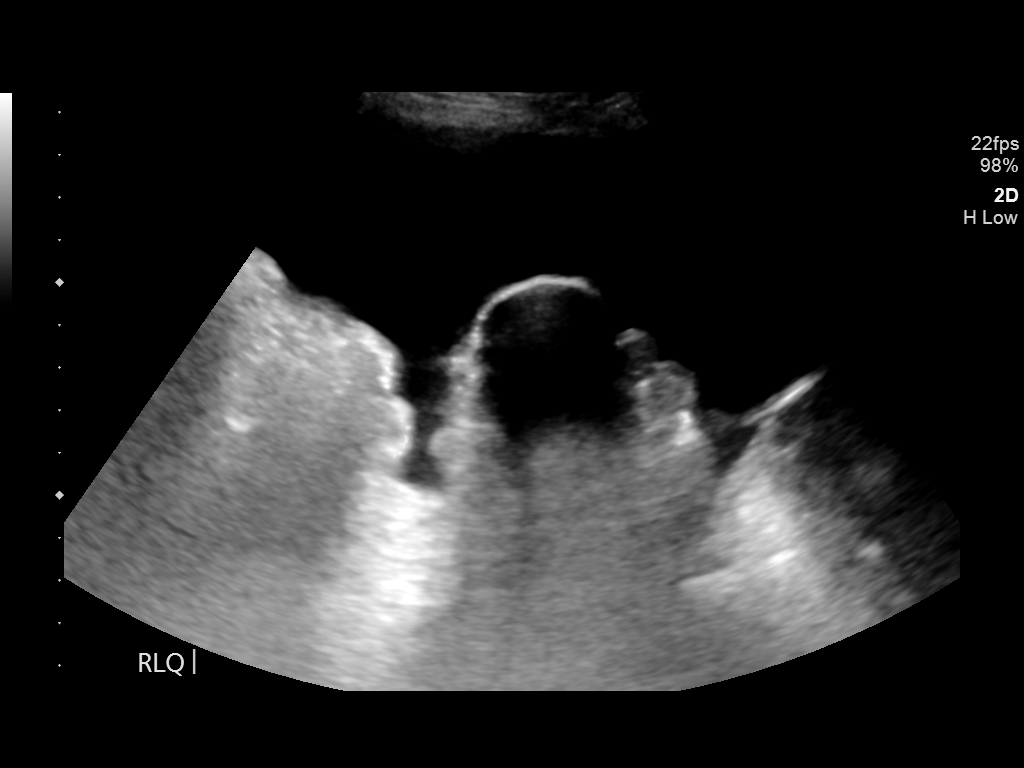
[im 3/5]
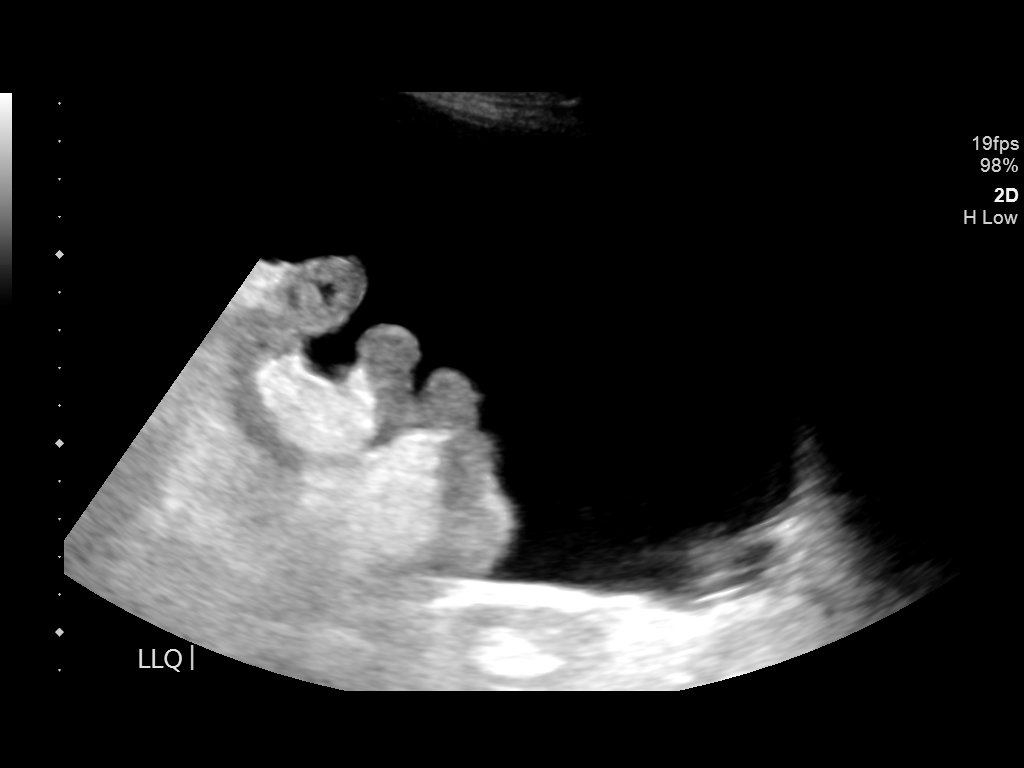
[im 4/5]
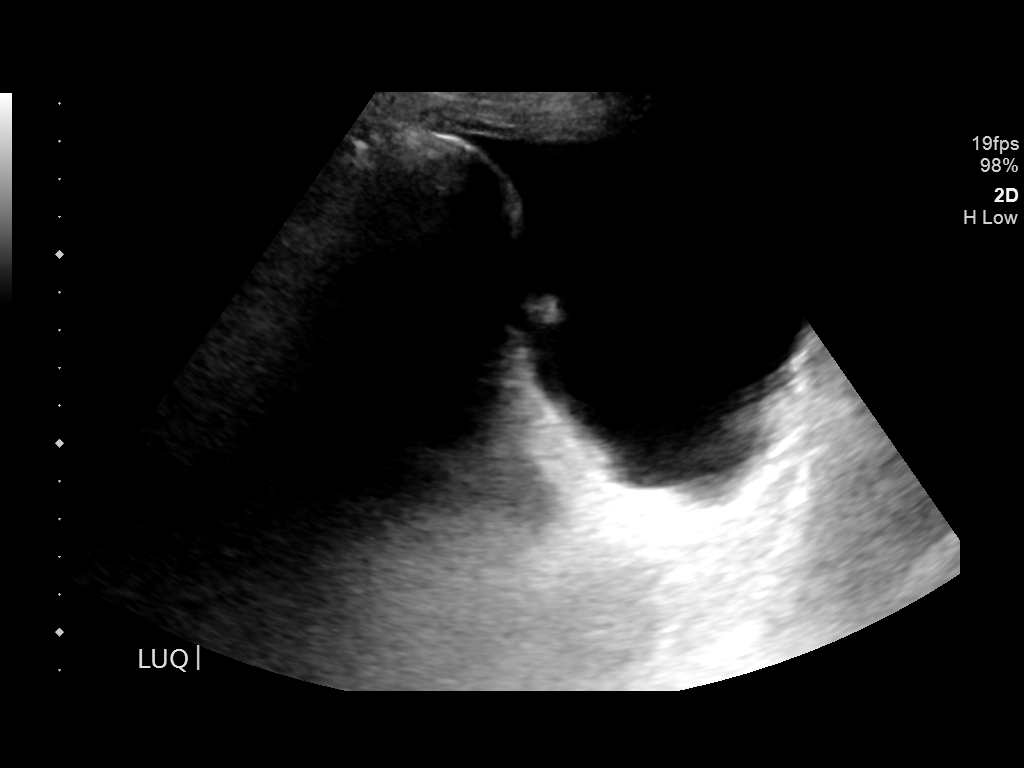
[im 5/5]
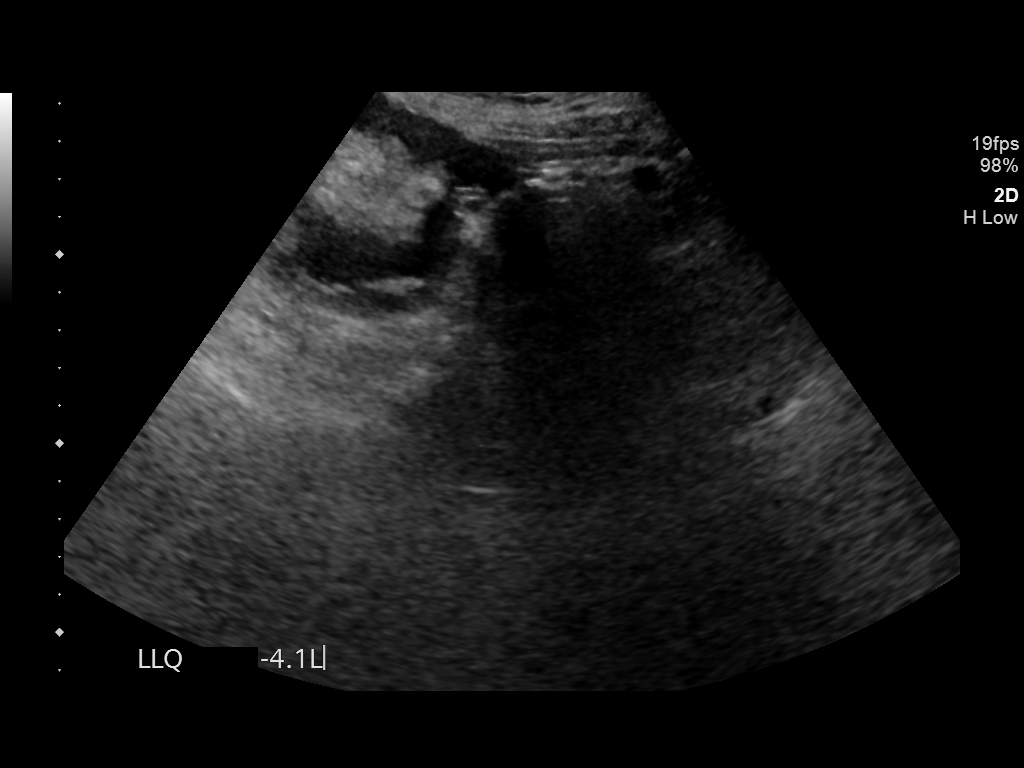

[5 of 5 positions shown; findings below may reference images not displayed]

EXAM:
ULTRASOUND GUIDED LEFT LOWER QUADRANT PARACENTESIS

MEDICATIONS:
None.

COMPLICATIONS:
None immediate.

PROCEDURE:
Informed written consent was obtained from the patient after a
discussion of the risks, benefits and alternatives to treatment. A
timeout was performed prior to the initiation of the procedure.

Initial ultrasound scanning demonstrates a large amount of ascites
within the left lower abdominal quadrant. The left lower abdomen was
prepped and draped in the usual sterile fashion. 1% lidocaine with
epinephrine was used for local anesthesia.

Following this, a 6 Fr Safe-T-Centesis catheter was introduced. An
ultrasound image was saved for documentation purposes. The
paracentesis was performed. The catheter was removed and a dressing
was applied. The patient tolerated the procedure well without
immediate post procedural complication.
FINDINGS: A total of approximately 4.1 L of clear yellow fluid was removed.
Samples were sent to the laboratory as requested by the clinical
team.
IMPRESSION: Successful ultrasound-guided paracentesis yielding 4.1 liters of
peritoneal fluid.
